# Patient Record
Sex: Female | Born: 1980 | Race: White | Hispanic: No | Smoking: Never smoker
Health system: Southern US, Community
[De-identification: ages and names within clinical notes are randomized; demographics above are authoritative.]

## PROBLEM LIST (undated history)

## (undated) DIAGNOSIS — Z87898 Personal history of other specified conditions: Secondary | ICD-10-CM

## (undated) DIAGNOSIS — O24419 Gestational diabetes mellitus in pregnancy, unspecified control: Secondary | ICD-10-CM

## (undated) DIAGNOSIS — N760 Acute vaginitis: Principal | ICD-10-CM

## (undated) DIAGNOSIS — R87619 Unspecified abnormal cytological findings in specimens from cervix uteri: Secondary | ICD-10-CM

## (undated) DIAGNOSIS — Z3490 Encounter for supervision of normal pregnancy, unspecified, unspecified trimester: Principal | ICD-10-CM

## (undated) DIAGNOSIS — R87629 Unspecified abnormal cytological findings in specimens from vagina: Secondary | ICD-10-CM

## (undated) DIAGNOSIS — B009 Herpesviral infection, unspecified: Secondary | ICD-10-CM

## (undated) DIAGNOSIS — IMO0002 Reserved for concepts with insufficient information to code with codable children: Secondary | ICD-10-CM

## (undated) DIAGNOSIS — B9689 Other specified bacterial agents as the cause of diseases classified elsewhere: Principal | ICD-10-CM

## (undated) DIAGNOSIS — R569 Unspecified convulsions: Secondary | ICD-10-CM

## (undated) DIAGNOSIS — E119 Type 2 diabetes mellitus without complications: Secondary | ICD-10-CM

## (undated) HISTORY — DX: Personal history of other specified conditions: Z87.898

## (undated) HISTORY — DX: Other specified bacterial agents as the cause of diseases classified elsewhere: B96.89

## (undated) HISTORY — DX: Unspecified abnormal cytological findings in specimens from cervix uteri: R87.619

## (undated) HISTORY — DX: Type 2 diabetes mellitus without complications: E11.9

## (undated) HISTORY — DX: Gestational diabetes mellitus in pregnancy, unspecified control: O24.419

## (undated) HISTORY — DX: Herpesviral infection, unspecified: B00.9

## (undated) HISTORY — DX: Encounter for supervision of normal pregnancy, unspecified, unspecified trimester: Z34.90

## (undated) HISTORY — DX: Reserved for concepts with insufficient information to code with codable children: IMO0002

## (undated) HISTORY — PX: NO PAST SURGERIES: SHX2092

## (undated) HISTORY — DX: Acute vaginitis: N76.0

## (undated) HISTORY — DX: Unspecified abnormal cytological findings in specimens from vagina: R87.629

---

## 2000-11-29 ENCOUNTER — Ambulatory Visit (HOSPITAL_COMMUNITY): Admission: RE | Admit: 2000-11-29 | Discharge: 2000-11-29 | Payer: Self-pay | Admitting: *Deleted

## 2000-11-29 ENCOUNTER — Encounter: Payer: Self-pay | Admitting: *Deleted

## 2000-12-18 ENCOUNTER — Inpatient Hospital Stay (HOSPITAL_COMMUNITY): Admission: EM | Admit: 2000-12-18 | Discharge: 2000-12-20 | Payer: Self-pay | Admitting: Emergency Medicine

## 2001-03-28 ENCOUNTER — Inpatient Hospital Stay (HOSPITAL_COMMUNITY): Admission: AD | Admit: 2001-03-28 | Discharge: 2001-03-30 | Payer: Self-pay | Admitting: *Deleted

## 2001-09-15 ENCOUNTER — Emergency Department (HOSPITAL_COMMUNITY): Admission: EM | Admit: 2001-09-15 | Discharge: 2001-09-15 | Payer: Self-pay | Admitting: Internal Medicine

## 2001-10-28 ENCOUNTER — Emergency Department (HOSPITAL_COMMUNITY): Admission: EM | Admit: 2001-10-28 | Discharge: 2001-10-28 | Payer: Self-pay | Admitting: Emergency Medicine

## 2001-12-25 ENCOUNTER — Encounter: Payer: Self-pay | Admitting: *Deleted

## 2001-12-25 ENCOUNTER — Ambulatory Visit (HOSPITAL_COMMUNITY): Admission: RE | Admit: 2001-12-25 | Discharge: 2001-12-25 | Payer: Self-pay | Admitting: *Deleted

## 2002-01-24 ENCOUNTER — Emergency Department (HOSPITAL_COMMUNITY): Admission: EM | Admit: 2002-01-24 | Discharge: 2002-01-24 | Payer: Self-pay | Admitting: Emergency Medicine

## 2002-05-11 ENCOUNTER — Inpatient Hospital Stay (HOSPITAL_COMMUNITY): Admission: RE | Admit: 2002-05-11 | Discharge: 2002-05-13 | Payer: Self-pay | Admitting: *Deleted

## 2002-08-15 ENCOUNTER — Other Ambulatory Visit: Admission: RE | Admit: 2002-08-15 | Discharge: 2002-08-15 | Payer: Self-pay | Admitting: *Deleted

## 2002-09-06 ENCOUNTER — Emergency Department (HOSPITAL_COMMUNITY): Admission: EM | Admit: 2002-09-06 | Discharge: 2002-09-06 | Payer: Self-pay | Admitting: *Deleted

## 2002-09-06 ENCOUNTER — Encounter: Payer: Self-pay | Admitting: *Deleted

## 2003-08-10 ENCOUNTER — Emergency Department (HOSPITAL_COMMUNITY): Admission: EM | Admit: 2003-08-10 | Discharge: 2003-08-10 | Payer: Self-pay | Admitting: Emergency Medicine

## 2004-06-02 ENCOUNTER — Emergency Department (HOSPITAL_COMMUNITY): Admission: EM | Admit: 2004-06-02 | Discharge: 2004-06-02 | Payer: Self-pay | Admitting: Emergency Medicine

## 2004-08-07 ENCOUNTER — Emergency Department (HOSPITAL_COMMUNITY): Admission: EM | Admit: 2004-08-07 | Discharge: 2004-08-07 | Payer: Self-pay | Admitting: Emergency Medicine

## 2006-09-06 ENCOUNTER — Emergency Department (HOSPITAL_COMMUNITY): Admission: EM | Admit: 2006-09-06 | Discharge: 2006-09-06 | Payer: Self-pay | Admitting: Emergency Medicine

## 2006-10-12 ENCOUNTER — Emergency Department (HOSPITAL_COMMUNITY): Admission: EM | Admit: 2006-10-12 | Discharge: 2006-10-12 | Payer: Self-pay | Admitting: Emergency Medicine

## 2006-10-19 ENCOUNTER — Emergency Department (HOSPITAL_COMMUNITY): Admission: EM | Admit: 2006-10-19 | Discharge: 2006-10-19 | Payer: Self-pay | Admitting: *Deleted

## 2007-03-18 ENCOUNTER — Inpatient Hospital Stay (HOSPITAL_COMMUNITY): Admission: AD | Admit: 2007-03-18 | Discharge: 2007-03-18 | Payer: Self-pay | Admitting: Family Medicine

## 2007-05-04 ENCOUNTER — Emergency Department (HOSPITAL_COMMUNITY): Admission: EM | Admit: 2007-05-04 | Discharge: 2007-05-04 | Payer: Self-pay | Admitting: Emergency Medicine

## 2007-08-11 ENCOUNTER — Inpatient Hospital Stay (HOSPITAL_COMMUNITY): Admission: AD | Admit: 2007-08-11 | Discharge: 2007-08-11 | Payer: Self-pay | Admitting: Obstetrics & Gynecology

## 2007-08-20 ENCOUNTER — Inpatient Hospital Stay (HOSPITAL_COMMUNITY): Admission: AD | Admit: 2007-08-20 | Discharge: 2007-08-22 | Payer: Self-pay | Admitting: Obstetrics and Gynecology

## 2007-08-20 ENCOUNTER — Ambulatory Visit: Payer: Self-pay | Admitting: Obstetrics and Gynecology

## 2007-08-28 ENCOUNTER — Encounter: Payer: Self-pay | Admitting: Emergency Medicine

## 2007-08-28 ENCOUNTER — Inpatient Hospital Stay (HOSPITAL_COMMUNITY): Admission: EM | Admit: 2007-08-28 | Discharge: 2007-08-31 | Payer: Self-pay | Admitting: Emergency Medicine

## 2007-08-28 ENCOUNTER — Ambulatory Visit: Payer: Self-pay | Admitting: Emergency Medicine

## 2007-09-05 ENCOUNTER — Ambulatory Visit (HOSPITAL_COMMUNITY): Admission: RE | Admit: 2007-09-05 | Discharge: 2007-09-05 | Payer: Self-pay | Admitting: Obstetrics & Gynecology

## 2007-09-17 ENCOUNTER — Emergency Department (HOSPITAL_COMMUNITY): Admission: EM | Admit: 2007-09-17 | Discharge: 2007-09-17 | Payer: Self-pay | Admitting: Emergency Medicine

## 2008-12-12 ENCOUNTER — Emergency Department (HOSPITAL_COMMUNITY): Admission: EM | Admit: 2008-12-12 | Discharge: 2008-12-12 | Payer: Self-pay | Admitting: Emergency Medicine

## 2009-01-28 ENCOUNTER — Emergency Department (HOSPITAL_COMMUNITY): Admission: EM | Admit: 2009-01-28 | Discharge: 2009-01-28 | Payer: Self-pay | Admitting: Emergency Medicine

## 2009-05-07 ENCOUNTER — Other Ambulatory Visit: Admission: RE | Admit: 2009-05-07 | Discharge: 2009-05-07 | Payer: Self-pay | Admitting: Obstetrics and Gynecology

## 2009-09-24 ENCOUNTER — Encounter: Admission: RE | Admit: 2009-09-24 | Discharge: 2009-09-24 | Payer: Self-pay | Admitting: Obstetrics & Gynecology

## 2009-11-16 ENCOUNTER — Inpatient Hospital Stay (HOSPITAL_COMMUNITY): Admission: AD | Admit: 2009-11-16 | Discharge: 2009-11-16 | Payer: Self-pay | Admitting: Obstetrics and Gynecology

## 2009-11-23 ENCOUNTER — Inpatient Hospital Stay (HOSPITAL_COMMUNITY): Admission: AD | Admit: 2009-11-23 | Discharge: 2009-11-25 | Payer: Self-pay | Admitting: Obstetrics & Gynecology

## 2009-11-23 ENCOUNTER — Ambulatory Visit: Payer: Self-pay | Admitting: Family Medicine

## 2009-11-23 DIAGNOSIS — O24419 Gestational diabetes mellitus in pregnancy, unspecified control: Secondary | ICD-10-CM

## 2010-06-28 ENCOUNTER — Encounter: Payer: Self-pay | Admitting: Neurology

## 2010-08-23 LAB — RPR: RPR Ser Ql: NONREACTIVE

## 2010-08-23 LAB — CBC
MCHC: 34.5 g/dL (ref 30.0–36.0)
RDW: 13.5 % (ref 11.5–15.5)

## 2010-08-23 LAB — GLUCOSE, CAPILLARY: Glucose-Capillary: 104 mg/dL — ABNORMAL HIGH (ref 70–99)

## 2010-08-23 LAB — GLUCOSE, RANDOM: Glucose, Bld: 101 mg/dL — ABNORMAL HIGH (ref 70–99)

## 2010-09-12 LAB — URINALYSIS, ROUTINE W REFLEX MICROSCOPIC
Bilirubin Urine: NEGATIVE
Glucose, UA: NEGATIVE mg/dL
Ketones, ur: NEGATIVE mg/dL
Specific Gravity, Urine: <1.005 — ABNORMAL LOW (ref 1.005–1.030)
pH: 6 (ref 5.0–8.0)

## 2010-09-12 LAB — URINE MICROSCOPIC-ADD ON

## 2010-09-13 LAB — URINE CULTURE

## 2010-09-13 LAB — URINALYSIS, ROUTINE W REFLEX MICROSCOPIC
Nitrite: NEGATIVE
Protein, ur: NEGATIVE mg/dL
Urobilinogen, UA: 0.2 mg/dL (ref 0.0–1.0)

## 2010-09-13 LAB — URINE MICROSCOPIC-ADD ON

## 2010-10-20 NOTE — Discharge Summary (Signed)
NAMEJADIA, [name]                ACCOUNT NO.:  [id number]   MEDICAL RECORD NO.:  [id number]          PATIENT TYPE:  INP   LOCATION:  3008                         FACILITY:  [facility]   PHYSICIAN:  Pramod P. Pearlean Brownie, MD    DATE OF BIRTH:  [birth year]   DATE OF ADMISSION:  08/28/2007  DATE OF DISCHARGE:  08/31/2007                               DISCHARGE SUMMARY   DIAGNOSES AT THE TIME OF DISCHARGE:  1. 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  2. Hypokalemia resolved.   MEDICINES AT THE TIME OF DISCHARGE:  Dilantin 300 mg nightly.   STUDIES PERFORMED:  1. CT of the brain on admission showed subarachnoid blood, primarily      around the right frontal lobe.  Suspect subacute infarct in the      left cerebellar hemisphere.  2. Chest x-ray shows endotracheal tube and NG tube placement.  3. MRI of the brain shows no infarct, abnormal brain edema within the      left cerebellum and affecting the gyri bilaterally from both      occipital regions to both frontal regions.  Pattern suggestive of      posterior reversible encephalopathy syndrome.  This can be seen      with eclampsia.  Subarachnoid hemorrhage within the fossa  of right      frontal region.  No intraparenchymal bleeding is seen.  4. MRI of the head is normal in both large and medium-sized vessels.  5. Chest x-ray shows no acute cardiopulmonary process post extubation.      CT of the brain at 48 hours post hospitalization shows decreasing      conspicuity of right frontal subarachnoid blood.  Bilateral      occipital and posterior parietal white matter hypoattenuation most      compatible with posterior reversible encephalopathy syndrome.   LABORATORY STUDIES:  Hemoglobin 9.8, hematocrit 28.8, white blood cell  11.4, and platelets 117.  Chemistry with BUN 3, creatinine 0.61,  otherwise normal.  Liver function tests normal.  Albumin 2.3.  Triglycerides 180.  Calcium 7.8 with magnesium of 4.3 on drip and  phosphorous of 3.5.  Urinalysis negative.  Dilantin level at 10.5.   HISTORY OF PRESENT ILLNESS:  [name] is a 30 year old right-  handed Caucasian female who is 8 days postpartum with an uncomplicated  vaginal delivery.  She related the 72-hour history of headache worsening  with blurring of vision.  No nausea or vomiting.  CT at [facility] showed a small amount of frontal subarachnoid blood with  hyperdensity in the left cerebellum.  She was transferred to [facility]  and had a seizure in the emergency room.  Her blood pressure was in the  150s/100s.  Dr. Leotis Pain Duru from [facility] was consulted who  recommended magnesium sulfate.  After that, the patient was intubated  and was place on Diprivan drip.  She was admitted to the Neuro ICU for  further evaluation.   HOSPITAL COURSE:  The patient was  placed on nicardipine drip for blood  pressure control.  She was rapidly extubated without difficulty on her  second hospital day.  It felt like her headache, seizures, and blurred  vision were all postpartum related PRES syndrome.  She did maintain a  magnesium drip initially and then it was weaned.  Her blood pressure  came down to 110s/70s.  She was placed on Dilantin for seizure  prophylaxis.  PT, OT and speech therapy evaluated and she has no need in  hospital and is safe to return home.  She was placed on Dilantin 300 mg  p.o. nightly for seizures at home and needs followup in 2 months to  follow up EEG and MRI at that time.  She has been advised not breast-  feed secondary to Dilantin.   CONDITION ON DISCHARGE:  The patient alert and oriented x3.  No focal  deficits.  She has had no seizures.  Her heart rate is regular.  Breath  sounds are clear.   DISCHARGE PLAN:  1. Discharge home with family.  2. Dilantin for seizure.  3. Followup OB/GYN in 1 month.  4. Follow up with Dr. Delia Heady in 2 months.  She would need an      outpatient EEG and MRI at that time.  5. The patient has been advised to not breast-feed.      Annie Main, N.P.    ______________________________  Sunny Schlein. Pearlean Brownie, MD    SB/MEDQ  D:  08/31/2007  T:  09/01/2007  Job:  [id number]   cc:   Tilda Burrow, M.D.

## 2010-10-20 NOTE — H&P (Signed)
NAMEEZRA, MARQUESS NO.:  [id number]   MEDICAL RECORD NO.:  [id number]          PATIENT TYPE:  INP   LOCATION:  1824                         FACILITY:  [facility]   PHYSICIAN:  Payton Doughty, M.D.      DATE OF BIRTH:  [birth year]   DATE OF ADMISSION:  08/28/2007  DATE OF DISCHARGE:                              HISTORY & PHYSICAL   ADMITTING NOTE:  I was called about this is a  30 year old, right  handed, white female, 8 days postpartum, uncomplicated delivery.  She  relates a 72-hour history of headache worsening with blurring of vision  today.  No nausea and vomiting.  CT at [facility] showed small  amount of frontal subarachnoid blood with hyper-density in the left  cerebellum.  She was transported down here and had a seizure in the  emergency room.   MEDICAL HISTORY:  Negative.  There is no hypertension.   SURGICAL HISTORY:  She has had 5 children.   SOCIAL HISTORY:  No tobacco.  No ethanol.   REVIEW OF SYSTEMS:  Remarkable for headache, blurred vision.   FAMILY HISTORY:  Negative.   ALLERGIES:  None.   MEDICATIONS:  None.   PHYSICAL EXAMINATION:  VITAL SIGNS:  Blood pressure was in the  150s/100s, now it is down into the 130s/70s after some labetalol.  GENERAL:  Prior to her seizure, she was awake and alert.  HEENT:  Normal limits.  _______________ poorly seen.  NECK:  Without deformity.  CHEST:  Clear.  CARDIAC:  Regular rate and rhythm without a murmur.  ABDOMEN:  Soft.  EXTREMITIES:  Without clubbing or cyanosis.  NEUROLOGIC:  Once again, prior to her seizure she was awake, alert, and  oriented x3 with good recall.  Her cranial nerves are intact.  Motor  exam is 5/5 with no drift.  Sensation is intact.  Reflexes are 1  throughout.  Toes are downgoing.   CT was noted as above.   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   PLAN:  Blood  pressure control and seizure control with mag sulfate.  I  did discuss this case with Dr. Johnella Moloney from the [facility] who  recommended mag sulfate 6 gram load at 2 G/hr for 24 hours.  After the  patient had her 2nd seizure, I asked that she be intubated.  We will  place her on a Diprivan drip as well and take her for MRA/MRV to rule  out sagittal sinus thrombosis.           ______________________________  Payton Doughty, M.D.     MWR/MEDQ  D:  08/28/2007  T:  08/28/2007  Job:  [id number]

## 2010-10-20 NOTE — H&P (Signed)
[name], [name]                ACCOUNT NO.:  [id number]   MEDICAL RECORD NO.:  [id number]          PATIENT TYPE:  INP   LOCATION:  3103                         FACILITY:  [facility]   PHYSICIAN:  Pramod P. Pearlean Brownie, MD    DATE OF BIRTH:  December 26, 1980   DATE OF ADMISSION:  08/28/2007  DATE OF DISCHARGE:                              HISTORY & PHYSICAL   REASON FOR REFERRAL:  Seizures and headache.   HISTORY OF PRESENT ILLNESS:  [name] is a 30 year old Caucasian lady  who is eight days postpartum who came into the [facility] Emergency Room  today complaining of three days of headache as well as blurred vision  today. She was transferred to [facility] Emergency Room for further  evaluation where a CT scan of the head showed small left frontal and  falx subarachnoid hemorrhage as well as a low density in the left  cerebellum. The patient subsequently had a witnessed generalized  tonoclonic seizure in the emergency room. She was given 2 mg of Ativan  and then had a second witnessed seizure following which she was  intubated and placed on magnesium drip and is currently being on  propofol drip which is about to be started. The patient had blood  pressure documented by EMS as 156/97 and one occasion 171/106 and  168/106. She apparently has no history of hypertension. She was given  one dose of labetalol 20 mg IV once here in the emergency room. At  present, the blood pressure is 110/80. She is also receiving Dilaudid,  Zofran as well as 100 mg of fentanyl prior to the intubation. Prior to  the seizures and sedation when evaluated by the ER nurse here she had a  nonfocal exam and complained only of headache and some blurred vision.  The patient is unavailable to provide any history which is obtained from  her father and mother who are here at the bedside. They state apparently  she has no known prior history of seizure, migraine headaches, or any  known neurologic problems. She recently had her  fifth child eight days  ago. The delivery was uncomplicated. She was doing fine at home and  started complaining of headache only three days ago.   PAST MEDICAL HISTORY:  Unremarkable. No history of hypertension,  seizures, or stroke.   SOCIAL HISTORY:  The patient has five children. She does not smoke or  drink. She lives at home with her husband.   FAMILY HISTORY:  Noncontributory.   REVIEW OF SYSTEMS:  Headache, blurred vision, and now seizures. Recent  delivery.   MEDICATION ALLERGIES:  None known.   HOME MEDICATIONS:  None.   PHYSICAL EXAMINATION:  GENERAL:  Young Caucasian girl who is intubated  and sedated. She is afebrile at present with heart rate of 110 per  minute, regular sinus rhythm.  Blood pressure 110/64. Respiratory rate  is 16 per minute.  Distal pulses are well felt.  HEAD:  Nontraumatic.  NECK:  Supple. There is no bruit.  ENT:  Unremarkable.  CARDIAC:  Regular heart sounds.  LUNGS:  Clear to  auscultation.  ABDOMEN:  Soft, nontender.  NEUROLOGIC:  The patient is sedated. She is unresponsive. She does not  open eyes or follow commands. She moves upper extremities minimally to  sternal rub. Pupils are 3 mm and reactive.  Doll's eye movements are  brisk. Corneal reflexes are brisk bilaterally. She grimaces to pain  bilaterally. Tone is decreased in all four extremities.  Reflexes are  brisk.  Plantars are downgoing. She withdraws both lower extremities and  hands minimally to pain.   LABORATORY DATA:  Noncontrast CAT scan of the head done today reveals  small area of subarachnoid blood in the left frontal region as well as  the anterior interfalx region. Low density of indeterminate age is noted  in the left cerebellar white matter as well.   IMPRESSION:  A 30 year old lady, eight days postpartum, with three days  of headache, blurred vision, and now generalized seizures. Differential  diagnosis includes eclampsia versus venous sinus thrombosis or  posterior  reversible encephalopathy syndrome.   PLAN:  The patient is critically ill and at significant risk for  neurologically worsening seizures and increase in hemorrhage. She will  be admitted to the intensive care unit. Aggressive treatment of blood  pressure with IV Cardene drip to keep the systolic blood pressure 120-  161. Aggressive treatment of seizures initially with magnesium sulfate.  If alternative etiology for seizures is found other than eclampsia, may  switch to Dilantin. Keep the patient sedated with Propofol. Perform an  emergent MRI and MRV of the brain and MRA. I spent an hour of neuro  critical care time at the patient's bedside as well as discussing her  care with Dr. Channing Mutters, the ER physician as well as the patient's parents and  answered questions.           ______________________________  Sunny Schlein. Pearlean Brownie, MD     PPS/MEDQ  D:  08/28/2007  T:  08/28/2007  Job:  [id number]

## 2010-10-20 NOTE — Consult Note (Signed)
NAMECRISTIANNA, [name]                ACCOUNT NO.:  [id number]   MEDICAL RECORD NO.:  [id number]          PATIENT TYPE:  INP   LOCATION:  1824                         FACILITY:  [facility]   PHYSICIAN:  Norton Blizzard, MD    DATE OF BIRTH:  23-Nov-1980   DATE OF CONSULTATION:  DATE OF DISCHARGE:                                 CONSULTATION   The patient is a 30 year old G6, P5-0-1-5 status post spontaneous  vaginal delivery on August 20, 2007, who is now readmitted post partum  day #8 with elevated blood pressures, subarachnoid bleed, and seizures.  Of note, the patient had an uncomplicated prenatal course. There were no  signs or symptoms of chronic hypertension, pregnancy-induced  hypertension, or pre-eclampsia. After her delivery on March 15th, the  patient did not have any elevated blood pressures or other signs of pre-  eclampsia. Her maximum blood pressure was 118/75. She was discharged to  home on post-partum day #2 in stable condition. The patient presented to  [facility] ER earlier today with headache, and the headache was noted to  be unresponsive to medications. She underwent a CT scan which showed  subarachnoid blood primarily around the right frontal lobe, and concern  for a subacute infarct involving the left cerebral hemisphere. The  patient was then urgently sent to [facility] for further management.  Upon arrival at [facility] ED, the patient was noted to have a seizure  in the setting of blood pressure in the 170's/110. Given that she is  recently post partum, the major differential diagnosis was that of  eclamptic seizure. I was called for recommendations for the management  of eclamptic seizures. As of the time of this dictation, the patient is  being managed by the neurosurgery team and Dr. Trey Sailors at [facility].   After consulting with the maternal fetal medicine specialist, Dr. Toma Copier, the following are the recommendations for the management of this  patient:  1. For an eclamptic seizure, the usual management is magnesium      sulfate. We recommend a 6-gram load intravenously followed by 2      grams per hour for the next 24 hours. The patient's magnesium level      should be kept between 4.8 to 8.4 mg/dl for therapeutic effect. Her      urine output should be closely monitored to make sure that she is      making adequate urine, and the patient's labs should also be      checked for other signs of end-organ  damage that could be present      which could involve her kidneys, liver, and also her hematologic      state in the form of low platelets or hemolysis.  2. Given that the patient was noted to have a subarachnoid bleed and      possible CVA, the etiology of her hypertension and seizures may not      be eclampsia related. It is unlikely that this is the case, but      given that there  is a small chance of this possibility, we      recommend a neurologic consult as the patient might need to be on      another antiepileptic medication even after the 24 hours of      magnesium administration or concurrently.  3. The patient is recently post partum and will be expected to have      bleeding or lochia that is expected for a few weeks after a vaginal      delivery. If there are any other questions about her post-partum      course and what to expect from an obstetric standpoint, please do      not hesitate to call us at [facility] and you can ask for the      attending on call.   We will follow along with you and help in any capacity that we can, and  please do not hesitate to call us for any questions.      Norton Blizzard, MD  Electronically Signed     UAD/MEDQ  D:  08/28/2007  T:  08/28/2007  Job:  [id number]

## 2010-10-20 NOTE — Consult Note (Signed)
NAMEJARAE, [name]                ACCOUNT NO.:  [id number]   MEDICAL RECORD NO.:  [id number]          PATIENT TYPE:  INP   LOCATION:  1824                         FACILITY:  [facility]   PHYSICIAN:  Norton Blizzard, MD    DATE OF BIRTH:  [birth year]   DATE OF CONSULTATION:  08/28/2007  DATE OF DISCHARGE:                                 CONSULTATION   HISTORY OF PRESENT ILLNESS:  The patient is a 30 year old G6, P5-0-1-5,  status post spontaneous vaginal delivery on August 18, 2007, who is re-  admitted today at [facility] for evaluation and management of  subarachnoid bleed. Of note, the patient had an uncomplicated vaginal  delivery on August 20, 2007 and was discharged to home in stable  condition 2 days later. During her postpartum stay and also during her  antepartal, she did not have any signs or symptoms of pregnancy-induced  hypertension or preeclampsia. Her highest blood pressure after delivery  was 118/75. The patient apparently reported to [facility]  emergency room today with a severe headache that was unresponsive to  medications and imaging showed subarachnoid bleed, primarily around the  right frontal lobe with a concern for possible subacute infarct  involving the left cerebellar hemisphere   DICTATION ENDS HERE.      Norton Blizzard, MD     UAD/MEDQ  D:  08/28/2007  T:  08/28/2007  Job:  [id number]

## 2010-10-23 NOTE — Discharge Summary (Signed)
[facility]  Patient:    [name], [name] Visit Number: [id number] MRN: [id number]          Service Type: OBS Location: 4A A418 01 Attending Physician:  Jeri Cos. Dictated by:   Langley Gauss, M.D. Admit Date:  03/28/2001 Discharge Date: 03/30/2001   CC:         Vivia Ewing, D.O.   Discharge Summary  DISCHARGE DIAGNOSIS:  38 plus week intrauterine pregnancy for induction of labor secondary to psychosocial factors.  PROCEDURE PERFORMED:  Spontaneous assisted vaginal delivery 8 pound 2 ounce female infant delivered over an intact perineum.  Pediatrician on call.  PERTINENT LABORATORY DATA:  Include admission hemoglobin and hematocrit 10.8/30.5 with a white count of 11.8.  Postpartum day #1 10.1/28.5.  DISCHARGE MEDICATIONS: 1. P.o. Motrin for pain relief. 2. 150 mg of IM Depo-Provera at time of discharge.  NOTE:  She would eventually like to have an IUD placed.  DISPOSITION:  She will follow up in the office in 5 weeks time at which time if still interested we can order an IUD for placement 1 week later.  HOSPITAL COURSE:  See previous dictations.  Patient delivered in the very late p.m. of March 28, 2001.  Postpartum did very well.  Bottle fed the infant. She did have one episode of heavy vaginal bleeding with bright red blood however, no passage of clots.  PHYSICAL EXAMINATION:  Examination at time of discharge the uterus is noted to be 16 week size, firm with no excessive bleeding.  Thus patient discharged home at the time.  Patient utilizing pediatrician on call, Dr. Vivia Ewing. Dictated by:   Langley Gauss, M.D. Attending Physician:  Jeri Cos. DD:  03/30/01 TD:  03/31/01 Job: [id number]

## 2010-10-23 NOTE — Op Note (Signed)
[name], [name]                          ACCOUNT NO.:  [id number]   MEDICAL RECORD NO.:  [id number]                   PATIENT TYPE:  INP   LOCATION:  A417                                 FACILITY:  [facility]   PHYSICIAN:  Langley Gauss, M.D.                DATE OF BIRTH:  05-12-1981   DATE OF PROCEDURE:  05/11/2002  DATE OF DISCHARGE:  05/13/2002                                 OPERATIVE REPORT   DELIVERY NOTE:   DIAGNOSES:  1. A 38-week intrauterine pregnancy, presenting in early labor.  2. Dysfunctional labor requiring Pitocin augmentation secondary to     inadequate frequency and intensity of uterine contractions.  3. Iron-deficiency anemia.   PROCEDURES:  1. Placement of continuous lumbar epidural anesthesia.  2. Spontaneous assisted vaginal delivery of a 7 pound 15 ounce female infant     delivered over an intact perform, delivery performed by Langley Gauss,     M.D.   ESTIMATED BLOOD LOSS:  Less than 500 cc.   COMPLICATIONS:  None.   SPECIMENS:  Arterial cord gas and cord blood to pathology.  The placenta was  examined and noted to be apparently intact with a three-vessel umbilical  cord.   SUMMARY:  The patient presented early a.m., 05/11/02, in early stages of  labor with irregular uterine contractions.  Amniotomy was performed with the  findings of clear amniotic fluid.  The patient did have a dysfunctional  labor pattern requiring Pitocin augmentation with onset of active uterine  contractions.  The patient initially requested only IV pain medication.  She  did receive two doses of IV Nubain; however, shortly thereafter she began  having stronger contractions and noted to have inadequate labor and  analgesia and thus requested an epidural, which was placed without  difficulty.  This functioned very well throughout the labor course with  excellent labor analgesia.  The patient was noted to have some pelvic  pressure, at which time she was examined and noted to be  completely dilated.  The patient at that point in time was placed in the dorsal lithotomy  position, prepped and draped in the usual sterile manner.  The Foley  catheter was removed.  The patient pushed well during the short second stage  of labor with descent of the infant's vertex to the perineal floor and noted  to be in an LOA position.  The infant then delivered over an intact perineum  without any lacerations occurring.  A nuchal cord x1 without compression was  reduced at time of delivery.  Gentle downward traction resulted in delivery  of the anterior shoulder on the pubic symphysis without difficulty.  The  umbilical cord was then milked toward the infant and the cord wsa doubly  clamped and cut, and the infant wsa placed on the maternal abdomen for  immediate bonding purposes.  The arterial cord gas and cord blood are then  obtained.  Gentle traction on the umbilical cord results in separation,  which upon examination appears to be an intact three-vessel placenta and  attached cord.  Examination of the genital tract reveals no  lacerations.  The perineum is intact.  Excellent uterine tone is achieved  following deliver.  The patient is thus returned to the left lateral  decubitus position, at which time the epidural catheter is removed with the  blue tip noted to be intact.  Both mother and infant are doing well  following delivery.                                               Langley Gauss, M.D.    DC/MEDQ  D:  05/14/2002  T:  05/14/2002  Job:  [id number]

## 2010-10-23 NOTE — Op Note (Signed)
   [name], [name]                          ACCOUNT NO.:  [id number]   MEDICAL RECORD NO.:  [id number]                   PATIENT TYPE:  INP   LOCATION:  A417                                 FACILITY:  [facility]   PHYSICIAN:  Langley Gauss, M.D.                DATE OF BIRTH:  10/16/1980   DATE OF PROCEDURE:  05/11/2002  DATE OF DISCHARGE:  05/13/2002                                 OPERATIVE REPORT   PROCEDURE PERFORMED:  Placement of continuous lumbar epidural anesthesia at  the L3-L4 interspace.   OPERATOR:  Langley Gauss, M.D.   COMPLICATIONS:  None.   DESCRIPTION OF PROCEDURE:  Appropriate informed consent was obtained.  The  patient had had two epidurals placed with previous pregnancies.  Continuous  electronic fetal monitoring was performed.  The patient was placed in the  fetal position at which time bony landmarks were identified.  The L3-L4  interspace was selected.  The patient's back was sterilely prepped and  draped utilizing the epidural kit.  5 cc of 1% lidocaine plain was injected  at the midline of the L3-L4 interspace to raise a small skin wheal.  The 17  gauge Tuohy Schliff needle was then utilized with loss of resistance and  _________ to identify and enter into epidural space on the first attempt  without difficulty.  Initial test dose of 5 cc of 1.5% lidocaine plus  epinephrine were injected through the epidural needle.  No signs of CSF or  intravascular injection obtained.  Thus the epidural catheter was inserted  to a depth of 5 cm.  The epidural needle was removed.  Aspiration test was  negative.  A second test dose of 2 cc of 1.5% lidocaine plus epinephrine was  injected through the epidural catheter.  Again, no signs of CSF,  intravascular injection obtained.  The patient is having tingling in the  feet consistent with the appropriate setting of the epidural block, thus she  is connected to the anesthesia pump containing the standard mixture.  She  will  be treated with a bolus of 10 cc followed by a continuous infusion at a  rate of 12 cc per hour.  The patient is returned to the left lateral supine  position at which time she does have evidence of a bilateral block with good  labral analgesia in a continued reassuring fetal heart rate.                                               Langley Gauss, M.D.    DC/MEDQ  D:  05/14/2002  T:  05/14/2002  Job:  [id number]

## 2010-10-23 NOTE — Discharge Summary (Signed)
   [name], [name]                          ACCOUNT NO.:  [id number]   MEDICAL RECORD NO.:  [id number]                   PATIENT TYPE:  INP   LOCATION:  A417                                 FACILITY:  [facility]   PHYSICIAN:  Langley Gauss, M.D.                DATE OF BIRTH:  [birth year]   DATE OF ADMISSION:  05/11/2002  DATE OF DISCHARGE:  05/13/2002                                 DISCHARGE SUMMARY   IMPRESSION:  1. Spontaneous assisted vaginal delivery of 7 pound 15 ounce female infant.  2. Continuous lumbar epidural analgesia.  3. Infant circumcision.   DISPOSITION:  The patient is to follow-up in the office in four weeks time.  She does desire permanent sterilization at that time. She has been supplied  and did sign the Medicaid paper regarding permanent sterilization and the 30  day waiting period on May 13, 2002. The patient is bottle feeding at the  time of discharge utilizing [employer]   DISCHARGE MEDICATIONS:  Lortab 10/500 as needed for chronic back pain. Now  with exacerbation following delivery.   LABORATORY DATA:  RPR is nonreactive. O+ blood type. Hemoglobin and  hematocrit 11.3 and 33.3 with white count of 11.2. On postpartum day one,  hemoglobin and hematocrit was 9.9 and 29.2 with a white count of 14.4.   HOSPITAL COURSE:  The patient labored and delivered on May 11, 2002.  Postpartum, she did very well. She bonded well with the infant. Had no  difficulty with the bottle feeding. Ambulated without difficulty. Had normal  lochia. No excessive vaginal bleeding. Was thus, discharged to home on  May 13, 2002. She was given a copy of standard discharge instructions.  Infant circumcision performed on the day of discharge.                                                Langley Gauss, M.D.    DC/MEDQ  D:  05/14/2002  T:  05/14/2002  Job:  [id number]

## 2010-10-23 NOTE — H&P (Addendum)
NAME:  [name], [name]                          ACCOUNT NO.:  [id number]   MEDICAL RECORD NO.:  [id number]                   PATIENT TYPE:  INP   LOCATION:  A417                                 FACILITY:  [facility]   PHYSICIAN:  Langley Gauss, M.D.                DATE OF BIRTH:  07/07/1980   DATE OF ADMISSION:  05/11/2002  DATE OF DISCHARGE:  05/13/2002                                HISTORY & PHYSICAL   HISTORY OF PRESENT ILLNESS:  This is a 30 year old gravida 4 para 3 who  presented to [facility] in the early stages of labor on May 11, 2002.  She was observed throughout the evening and had noted to have  increased frequently and intensity of uterine contractions.  The patient's  prenatal course has been complicated only by chronic back pain which is  exacerbated by the pregnancy.   OBSTETRICAL HISTORY:  Pertinent for three prior vaginal deliveries all  without complications.  She did have epidurals with two of these; with the  third she received IV narcotics only.   ALLERGIES:  No known drug allergies.   CURRENT MEDICATIONS:  1. Prenatal vitamins.  2. Lortab 10/500 on a p.r.n. basis.   PHYSICAL EXAMINATION:  VITAL SIGNS:  Height 4 feet 11 inches, weight 95,  blood pressure 122/85, pulse 84, respiratory rate 20.  HEENT:  Negative, no adenopathy.  NECK:  Supple.  Thyroid is nonpalpable.  LUNGS:  Clear.  CARDIOVASCULAR:  Regular rate and rhythm.  ABDOMEN:  Soft and nontender.  No surgical scars are identified.  Vertex  presentation by Leopold's maneuvers.  Fundal height of 36 cm.  EXTREMITIES:  Normal.  PELVIC:  Normal external genitalia.  No lesions or ulcerations identified,  no vaginal bleeding, no leakage of fluid.  Cervix 3 cm dilated, 70% effaced,  -1 station, very posterior.  MONITOR:  External fetal monitor reveals reassuring fetal heart rate with  irregular uterine contractions every three to eight minutes.   ASSESSMENT:  Gravida 4 para 3 presents in  early labor at [redacted] weeks gestation.  Will proceed with amniotomy.  Thereafter, augment or induce with Pitocin as  clinically indicated.                                                                                                             Langley Gauss, M.D.   DC/MEDQ  D:  05/14/2002  T:  05/14/2002  Job:  [id number]

## 2010-10-23 NOTE — Op Note (Signed)
[facility]  Patient:    [name], [name] Visit Number: [id number] MRN: [id number]          Service Type: OBS Location: 4A A418 01 Attending Physician:  Jeri Cos. Dictated by:   Langley Gauss, M.D. Proc. Date: 03/28/01 Admit Date:  03/28/2001 Discharge Date: 03/30/2001                             Operative Report  DIAGNOSIS:  A 38 plus week intrauterine pregnancy for induction of labor.  PROCEDURES:  Spontaneous assisted vaginal delivery 8 pound 2 ounce female infant delivered over intact perineum.  Delivery performed by Dr. Roylene Reason. Lisette Grinder. Estimated blood loss less than 500 cc.  COMPLICATIONS:  None.  SPECIMENS:  Arterial cord gas and cord blood to pathology for study only. Placenta is examined and is noted to apparently intact with the 3 vessel umbilical cord.  SUMMARY:  This is a 30 year old gravida 3, para 2, at [redacted] weeks gestation, who was admitted for induction of labor secondary to psychosocial factors.  The patients initial examination revealed very favorable cervix, 4 cm dilated. The patient is having irregular uterine contractions upon admission with a reassuring fetal heart rate.  Amniotomy is performed with findings of clear amniotic fluid.  Fetal scalp electrode is placed.  The patient continued to have a reassuring fetal heart rate throughout the course of labor.  The patient did require Pitocin augmentation secondary to dysfunctional labor pattern with inadequate frequency and intensity of uterine contractions.  With the onset of active labor the patient progressed normally along the labor curve.  She had epidurals with the 2 previous deliveries and had stated an interest in epidural during this labor, however, when I presented to place it it was not placed.  The patient thereafter progressed to complete dilatation, placed in the dorsolithotomy position.  The patient had ______ pushing efforts during the second stage of  labor, however, through excellent coaching efforts by the nursing staff, specifically, Anselmo Pickler Bricketts, the patient pushed well, descent to the vertex to the perineal floor, delivery then occurred in a direct OA position over an intact perineum while the nares were bulb suctioned of clear amniotic fluid.  Continued expulsive efforts resulted in spontaneous rotation to a left anterior shoulder position.  Gentle downward traction combined with expulsive efforts resulted in this delivery under the pubis symphysis without difficulty.  Remainder of the infant also delivered without difficulty.  The umbilical cord is then milked towards the infant, cord is doubly clamped and cut.  Infant is handed to the nursing staff for immediate assessment.  There is noted to be a spontaneous and vigorous breathing cry.  Arterial cord gas, and cord blood, are then obtained from the umbilical cord.  Gentle traction on the umbilical cord resulted in separation which upon examination appears to be intact 3 vessel placenta.  The patient had immediate onset of excellent uterine tone.  Examination of the genital tract reveals no lacerations, and specifically the perineum is noted to be intact.  Mother is taken out of dorsolithotomy position, allowed to bond with the infant.  She does plan on bottle feeding. Dictated by:   Langley Gauss, M.D. Attending Physician:  Jeri Cos. DD:  03/30/01 TD:  03/31/01 Job: [id number]

## 2011-03-01 LAB — BASIC METABOLIC PANEL
CO2: 21
CO2: 24
CO2: 24
Calcium: 5.8 — CL
Calcium: 7.3 — ABNORMAL LOW
Calcium: 7.8 — ABNORMAL LOW
Creatinine, Ser: 0.59
Creatinine, Ser: 0.61
Creatinine, Ser: 0.67
GFR calc Af Amer: >60
GFR calc non Af Amer: >60
GFR calc non Af Amer: >60
Glucose, Bld: 101 — ABNORMAL HIGH
Glucose, Bld: 195 — ABNORMAL HIGH
Sodium: 137
Sodium: 142

## 2011-03-01 LAB — BLOOD GAS, ARTERIAL
Acid-Base Excess: 0
Acid-Base Excess: 0.3
Acid-base deficit: 0.4
Bicarbonate: 22.8
Delivery systems: POSITIVE
Drawn by: 24549
FIO2: 0.3
FIO2: 0.3
FIO2: 0.6
MECHVT: 500
O2 Saturation: 100
O2 Saturation: 99.2
O2 Saturation: 99.7
RATE: 12
TCO2: 23.7
pCO2 arterial: 28.2 — ABNORMAL LOW
pCO2 arterial: 31.4 — ABNORMAL LOW
pO2, Arterial: 144 — ABNORMAL HIGH
pO2, Arterial: 179 — ABNORMAL HIGH

## 2011-03-01 LAB — URINALYSIS, ROUTINE W REFLEX MICROSCOPIC
Bilirubin Urine: NEGATIVE
Glucose, UA: NEGATIVE
Hgb urine dipstick: NEGATIVE
Hgb urine dipstick: NEGATIVE
Protein, ur: NEGATIVE
Specific Gravity, Urine: 1.017
Urobilinogen, UA: 0.2

## 2011-03-01 LAB — TRIGLYCERIDES: Triglycerides: 180 — ABNORMAL HIGH

## 2011-03-01 LAB — CBC
HCT: 26.4 — ABNORMAL LOW
Hemoglobin: 11.6 — ABNORMAL LOW
MCHC: 33.9
MCHC: 34.7
MCV: 82.5
Platelets: 288
RBC: 3.21 — ABNORMAL LOW
RBC: 4.06
RDW: 12.8
WBC: 12.1 — ABNORMAL HIGH
WBC: 14.4 — ABNORMAL HIGH

## 2011-03-01 LAB — COMPREHENSIVE METABOLIC PANEL
Albumin: 2.3 — ABNORMAL LOW
BUN: <1 — ABNORMAL LOW
Calcium: 5.7 — CL
Creatinine, Ser: 0.67
Glucose, Bld: 112 — ABNORMAL HIGH
Potassium: 3.2 — ABNORMAL LOW
Total Protein: 5.7 — ABNORMAL LOW

## 2011-03-01 LAB — HEPATIC FUNCTION PANEL
Albumin: 2.5 — ABNORMAL LOW
Total Bilirubin: 0.4
Total Protein: 5.5 — ABNORMAL LOW

## 2011-03-01 LAB — RPR: RPR Ser Ql: NONREACTIVE

## 2011-03-01 LAB — PHENYTOIN LEVEL, TOTAL: Phenytoin Lvl: 10.5

## 2011-03-01 LAB — PHOSPHORUS
Phosphorus: 3.1
Phosphorus: 3.5

## 2011-03-01 LAB — CALCIUM, IONIZED: Calcium, Ion: 1.25

## 2011-03-01 LAB — MAGNESIUM
Magnesium: 4.3 — ABNORMAL HIGH
Magnesium: 5.8 — ABNORMAL HIGH

## 2011-03-01 LAB — URINE MICROSCOPIC-ADD ON

## 2011-03-16 LAB — DIFFERENTIAL
Eosinophils Relative: 1
Lymphocytes Relative: 9 — ABNORMAL LOW
Lymphs Abs: 1.3
Monocytes Absolute: 0.7
Monocytes Relative: 5

## 2011-03-16 LAB — CBC
MCHC: 34.4
MCV: 83.4
Platelets: 351
WBC: 13.8 — ABNORMAL HIGH

## 2011-03-16 LAB — COMPREHENSIVE METABOLIC PANEL
AST: 17
Albumin: 2.8 — ABNORMAL LOW
Calcium: 8.7
Creatinine, Ser: 0.45
GFR calc Af Amer: >60
Sodium: 139
Total Protein: 6.4

## 2011-03-16 LAB — URINALYSIS, ROUTINE W REFLEX MICROSCOPIC
Hgb urine dipstick: NEGATIVE
Nitrite: NEGATIVE
Specific Gravity, Urine: 1.01
Urobilinogen, UA: 1
pH: 7.5

## 2011-03-16 LAB — LIPASE, BLOOD: Lipase: 16

## 2011-03-16 LAB — URINE MICROSCOPIC-ADD ON

## 2011-03-18 LAB — URINALYSIS, ROUTINE W REFLEX MICROSCOPIC
Bilirubin Urine: NEGATIVE
Glucose, UA: NEGATIVE
Hgb urine dipstick: NEGATIVE
Nitrite: NEGATIVE
Specific Gravity, Urine: 1.01
pH: 7

## 2011-03-18 LAB — URINE CULTURE

## 2011-03-18 LAB — URINE MICROSCOPIC-ADD ON

## 2011-03-18 LAB — WET PREP, GENITAL: Trich, Wet Prep: NONE SEEN

## 2011-10-19 ENCOUNTER — Other Ambulatory Visit (HOSPITAL_COMMUNITY)
Admission: RE | Admit: 2011-10-19 | Discharge: 2011-10-19 | Disposition: A | Discharge status: Home / Self Care | Payer: Medicaid Other | Source: Ambulatory Visit | Attending: Obstetrics and Gynecology | Admitting: Obstetrics and Gynecology

## 2011-10-19 DIAGNOSIS — Z01419 Encounter for gynecological examination (general) (routine) without abnormal findings: Secondary | ICD-10-CM | POA: Insufficient documentation

## 2011-10-19 DIAGNOSIS — Z1159 Encounter for screening for other viral diseases: Secondary | ICD-10-CM | POA: Insufficient documentation

## 2012-08-28 ENCOUNTER — Encounter: Payer: Self-pay | Admitting: Adult Health

## 2012-08-28 ENCOUNTER — Ambulatory Visit (INDEPENDENT_AMBULATORY_CARE_PROVIDER_SITE_OTHER): Payer: Medicaid Other | Admitting: Adult Health

## 2012-08-28 VITALS — BP 120/76 | Ht 59.0 in | Wt 121.0 lb

## 2012-08-28 DIAGNOSIS — B379 Candidiasis, unspecified: Secondary | ICD-10-CM

## 2012-08-28 DIAGNOSIS — N760 Acute vaginitis: Secondary | ICD-10-CM

## 2012-08-28 DIAGNOSIS — Z309 Encounter for contraceptive management, unspecified: Secondary | ICD-10-CM

## 2012-08-28 DIAGNOSIS — B9689 Other specified bacterial agents as the cause of diseases classified elsewhere: Secondary | ICD-10-CM

## 2012-08-28 DIAGNOSIS — B373 Candidiasis of vulva and vagina: Secondary | ICD-10-CM

## 2012-08-28 HISTORY — DX: Other specified bacterial agents as the cause of diseases classified elsewhere: B96.89

## 2012-08-28 LAB — POCT URINALYSIS DIPSTICK
Blood, UA: 1
Glucose, UA: NEGATIVE
Ketones, UA: NEGATIVE

## 2012-08-28 LAB — POCT WET PREP (WET MOUNT)

## 2012-08-28 MED ORDER — METRONIDAZOLE 500 MG PO TABS: 500.0000 mg | ORAL_TABLET | Freq: Two times a day (BID) | ORAL | Status: DC

## 2012-08-28 MED ORDER — NORGESTIMATE-ETH ESTRADIOL 0.25-35 MG-MCG PO TABS: 1.0000 | ORAL_TABLET | Freq: Every day | ORAL | Status: DC

## 2012-08-28 NOTE — Patient Instructions (Addendum)
Take flagyl as directed, no alcohol, start birth control with next menses, use condoms, follow up in 3 months for physical, do my chart

## 2012-08-28 NOTE — Progress Notes (Signed)
Subjective:     Patient ID: [name], female   DOB: 16-Oct-1980, 32 y.o.   MRN: [id number]  HPI [name] is a 32 year old white female in today with vaginal itching for 2 days, she has not tried over-the-counter medications. She has stopped her Depo-Provera injections and is requesting birth control. She said she had sex about 2 weeks ago.  Review of Systems Patient denies any headaches, blurred vision, shortness of breath, chest pain, abdominal pain, problems with bowel movements, urination, or intercourse. She does have vaginal itching x 2days, and need for birth control, requesting pills, reviewed past medical surgical social and family history. Reviewed medication & allergies. Denies the need for STD testing.     Objective:   Physical ExamVital signs: Blood pressure 120/76, weight 121 lbs., height 4 foot 11 inches. Urine 1+ blood, trace WBC. Wet prep was performed and was positive for clue cells. Skin warm and dry. Pelvic exam: External genitalia normal in appearance, the vagina is normal in appearance except for slight white discharge, the cervix is bulbous with -CMT. The uterus is felt to be within normal size shape and contour, no adnexal masses or tenderness noted.     Assessment:     Bacterial vaginosis Contraceptive management    Plan:      Take Flagyl as prescribed, no alcohol , use condoms, started birth control by mouth with next period, return to clinic in 3 months for Physical exam, prn problems.If no period in 1 month, schedule pregnancy test.

## 2012-08-31 ENCOUNTER — Telehealth: Payer: Self-pay | Admitting: *Deleted

## 2012-08-31 NOTE — Telephone Encounter (Signed)
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 Pt aware Rx called into pharmacy.

## 2012-09-11 ENCOUNTER — Telehealth: Payer: Self-pay | Admitting: Obstetrics and Gynecology

## 2012-09-11 NOTE — Telephone Encounter (Signed)
Per Dr. Erma Pinto, Pt to start BCP 12 weeks from last depo shot received if not had a period before that time. Pt informed could take up to 12 weeks to 6 months to have period since pt was on depo. Pt verbalized understanding.

## 2012-11-20 ENCOUNTER — Other Ambulatory Visit: Payer: Self-pay | Admitting: Obstetrics & Gynecology

## 2012-11-20 DIAGNOSIS — O3680X0 Pregnancy with inconclusive fetal viability, not applicable or unspecified: Secondary | ICD-10-CM

## 2012-11-21 ENCOUNTER — Ambulatory Visit (INDEPENDENT_AMBULATORY_CARE_PROVIDER_SITE_OTHER): Payer: Medicaid Other

## 2012-11-21 ENCOUNTER — Other Ambulatory Visit: Payer: Self-pay | Admitting: Obstetrics & Gynecology

## 2012-11-21 ENCOUNTER — Encounter: Payer: Self-pay | Admitting: Adult Health

## 2012-11-21 ENCOUNTER — Ambulatory Visit (INDEPENDENT_AMBULATORY_CARE_PROVIDER_SITE_OTHER): Payer: Medicaid Other | Admitting: Adult Health

## 2012-11-21 VITALS — BP 102/70 | Wt 119.0 lb

## 2012-11-21 DIAGNOSIS — O09299 Supervision of pregnancy with other poor reproductive or obstetric history, unspecified trimester: Secondary | ICD-10-CM

## 2012-11-21 DIAGNOSIS — O3680X0 Pregnancy with inconclusive fetal viability, not applicable or unspecified: Secondary | ICD-10-CM

## 2012-11-21 DIAGNOSIS — Z1389 Encounter for screening for other disorder: Secondary | ICD-10-CM

## 2012-11-21 DIAGNOSIS — Z331 Pregnant state, incidental: Secondary | ICD-10-CM

## 2012-11-21 DIAGNOSIS — Z3480 Encounter for supervision of other normal pregnancy, unspecified trimester: Secondary | ICD-10-CM

## 2012-11-21 DIAGNOSIS — Z3490 Encounter for supervision of normal pregnancy, unspecified, unspecified trimester: Secondary | ICD-10-CM

## 2012-11-21 HISTORY — DX: Encounter for supervision of normal pregnancy, unspecified, unspecified trimester: Z34.90

## 2012-11-21 LAB — CBC
HCT: 39.7 % (ref 36.0–46.0)
Hemoglobin: 13.5 g/dL (ref 12.0–15.0)
MCHC: 34 g/dL (ref 30.0–36.0)
MCV: 81.7 fL (ref 78.0–100.0)
RDW: 12.8 % (ref 11.5–15.5)

## 2012-11-21 NOTE — Progress Notes (Signed)
No complaints at this time.

## 2012-11-21 NOTE — Progress Notes (Signed)
U/S:  Single 5 wk size gestational sac centrally located in uterus, +yolk sac, too early for FHT or CRL, [facility] Mid February by sac size only Cervix closed, adnexa WNL

## 2012-11-21 NOTE — Patient Instructions (Addendum)

## 2012-11-21 NOTE — Progress Notes (Signed)
[name] is a single 32 year old white female with unplanned pregnancy was undesided at first but now wants to continue pregnancy. No bleeding or discharge or cramping.US showed early IUP with +yolk sac.Skin warm and dry. Neck: mid line trachea, normal thyroid. Lungs: clear to ausculation bilaterally. Cardiovascular: regular rate and rhythm.Breast exam deferred had negative HPV on pap in 2013.Will get prenatal labs today and return in 2 weeks for [place].

## 2012-11-22 LAB — DRUG SCREEN, URINE, NO CONFIRMATION
Barbiturate Quant, Ur: NEGATIVE
Creatinine,U: 32.7 mg/dL
Opiate Screen, Urine: NEGATIVE
Phencyclidine (PCP): NEGATIVE
Propoxyphene: NEGATIVE

## 2012-11-22 LAB — URINALYSIS, ROUTINE W REFLEX MICROSCOPIC
Ketones, ur: NEGATIVE mg/dL
Nitrite: NEGATIVE
Specific Gravity, Urine: 1.011 (ref 1.005–1.030)
Urobilinogen, UA: 0.2 mg/dL (ref 0.0–1.0)

## 2012-11-22 LAB — GC/CHLAMYDIA PROBE AMP
CT Probe RNA: NEGATIVE
GC Probe RNA: NEGATIVE

## 2012-11-22 LAB — ABO AND RH: Rh Type: POSITIVE

## 2012-11-22 LAB — ANTIBODY SCREEN: Antibody Screen: NEGATIVE

## 2012-11-22 LAB — SICKLE CELL SCREEN: Sickle Cell Screen: NEGATIVE

## 2012-11-23 ENCOUNTER — Other Ambulatory Visit: Payer: Self-pay | Admitting: Obstetrics & Gynecology

## 2012-11-23 DIAGNOSIS — O3680X0 Pregnancy with inconclusive fetal viability, not applicable or unspecified: Secondary | ICD-10-CM

## 2012-11-28 ENCOUNTER — Other Ambulatory Visit: Payer: Medicaid Other | Admitting: Adult Health

## 2012-12-04 ENCOUNTER — Encounter: Payer: Self-pay | Admitting: Women's Health

## 2012-12-04 ENCOUNTER — Ambulatory Visit (INDEPENDENT_AMBULATORY_CARE_PROVIDER_SITE_OTHER): Payer: Medicaid Other | Admitting: Women's Health

## 2012-12-04 ENCOUNTER — Ambulatory Visit (INDEPENDENT_AMBULATORY_CARE_PROVIDER_SITE_OTHER): Payer: Medicaid Other

## 2012-12-04 VITALS — BP 100/60 | Wt 117.0 lb

## 2012-12-04 DIAGNOSIS — Z3481 Encounter for supervision of other normal pregnancy, first trimester: Secondary | ICD-10-CM

## 2012-12-04 DIAGNOSIS — O09299 Supervision of pregnancy with other poor reproductive or obstetric history, unspecified trimester: Secondary | ICD-10-CM

## 2012-12-04 DIAGNOSIS — O09291 Supervision of pregnancy with other poor reproductive or obstetric history, first trimester: Secondary | ICD-10-CM

## 2012-12-04 DIAGNOSIS — Z1389 Encounter for screening for other disorder: Secondary | ICD-10-CM

## 2012-12-04 DIAGNOSIS — O219 Vomiting of pregnancy, unspecified: Secondary | ICD-10-CM

## 2012-12-04 DIAGNOSIS — Z3490 Encounter for supervision of normal pregnancy, unspecified, unspecified trimester: Secondary | ICD-10-CM | POA: Insufficient documentation

## 2012-12-04 DIAGNOSIS — Z331 Pregnant state, incidental: Secondary | ICD-10-CM

## 2012-12-04 DIAGNOSIS — O3680X0 Pregnancy with inconclusive fetal viability, not applicable or unspecified: Secondary | ICD-10-CM

## 2012-12-04 LAB — POCT URINALYSIS DIPSTICK
Glucose, UA: NEGATIVE
Leukocytes, UA: NEGATIVE
Nitrite, UA: NEGATIVE

## 2012-12-04 MED ORDER — DOXYLAMINE-PYRIDOXINE 10-10 MG PO TBEC: 10.0000 mg | DELAYED_RELEASE_TABLET | ORAL | Status: DC

## 2012-12-04 NOTE — Progress Notes (Signed)
U/S-single IUP with +FCA noted, CRL c/w 6+6wks, EDD 07/24/2013, cx long and closed, bilateral adnexa wnl

## 2012-12-04 NOTE — Progress Notes (Signed)
HAD U/S TODAY.

## 2012-12-04 NOTE — Patient Instructions (Addendum)
You will have your sugar test next visit.  Please do not eat or drink anything after midnight the night before you come, not even water.  You will be here for at least two hours.     Nausea & Vomiting  Have saltine crackers or pretzels by your bed and eat a few bites before you raise your head out of bed in the morning  Eat small frequent meals throughout the day instead of large meals  Drink plenty of fluids throughout the day to stay hydrated, just don't drink a lot of fluids with your meals.  This can make your stomach fill up faster making you feel sick  Do not brush your teeth right after you eat  Products with real ginger are good for nausea, like ginger ale and ginger hard candy Make sure it says made with real ginger!  Sucking on sour candy like lemon heads is also good for nausea  If your prenatal vitamins make you nauseated, take them at night so you will sleep through the nausea  If you feel like you need medicine for the nausea & vomiting please let us know  If you are unable to keep any fluids or food down please let us know    Pregnancy - First Trimester During sexual intercourse, millions of sperm go into the vagina. Only 1 sperm will penetrate and fertilize the female egg while it is in the Fallopian tube. One week later, the fertilized egg implants into the wall of the uterus. An embryo begins to develop into a baby. At 6 to 8 weeks, the eyes and face are formed and the heartbeat can be seen on ultrasound. At the end of 12 weeks (first trimester), all the baby's organs are formed. Now that you are pregnant, you will want to do everything you can to have a healthy baby. Two of the most important things are to get good prenatal care and follow your caregiver's instructions. Prenatal care is all the medical care you receive before the baby's birth. It is given to prevent, find, and treat problems during the pregnancy and childbirth. PRENATAL EXAMS  During prenatal visits,  your weight, blood pressure, and urine are checked. This is done to make sure you are healthy and progressing normally during the pregnancy.  A pregnant woman should gain 25 to 35 pounds during the pregnancy. However, if you are overweight or underweight, your caregiver will advise you regarding your weight.  Your caregiver will ask and answer questions for you.  Blood work, cervical cultures, other necessary tests, and a Pap test are done during your prenatal exams. These tests are done to check on your health and the probable health of your baby. Tests are strongly recommended and done for HIV with your permission. This is the virus that causes AIDS. These tests are done because medicines can be given to help prevent your baby from being born with this infection should you have been infected without knowing it. Blood work is also used to find out your blood type, previous infections, and follow your blood levels (hemoglobin).  Low hemoglobin (anemia) is common during pregnancy. Iron and vitamins are given to help prevent this. Later in the pregnancy, blood tests for diabetes will be done along with any other tests if any problems develop.  You may need other tests to make sure you and the baby are doing well. CHANGES DURING THE FIRST TRIMESTER  Your body goes through many changes during pregnancy. They vary from  person to person. Talk to your caregiver about changes you notice and are concerned about. Changes can include:  Your menstrual period stops.  The egg and sperm carry the genes that determine what you look like. Genes from you and your partner are forming a baby. The female genes determine whether the baby is a boy or a girl.  Your body increases in girth and you may feel bloated.  Feeling sick to your stomach (nauseous) and throwing up (vomiting). If the vomiting is uncontrollable, call your caregiver.  Your breasts will begin to enlarge and become tender.  Your nipples may stick out  more and become darker.  The need to urinate more. Painful urination may mean you have a bladder infection.  Tiring easily.  Loss of appetite.  Cravings for certain kinds of food.  At first, you may gain or lose a couple of pounds.  You may have changes in your emotions from day to day (excited to be pregnant or concerned something may go wrong with the pregnancy and baby).  You may have more vivid and strange dreams. HOME CARE INSTRUCTIONS   It is very important to avoid all smoking, alcohol and non-prescribed drugs during your pregnancy. These affect the formation and growth of the baby. Avoid chemicals while pregnant to ensure the delivery of a healthy infant.  Start your prenatal visits by the 12th week of pregnancy. They are usually scheduled monthly at first, then more often in the last 2 months before delivery. Keep your caregiver's appointments. Follow your caregiver's instructions regarding medicine use, blood and lab tests, exercise, and diet.  During pregnancy, you are providing food for you and your baby. Eat regular, well-balanced meals. Choose foods such as meat, fish, milk and other low fat dairy products, vegetables, fruits, and whole-grain breads and cereals. Your caregiver will tell you of the ideal weight gain.  You can help morning sickness by keeping soda crackers at the bedside. Eat a couple before arising in the morning. You may want to use the crackers without salt on them.  Eating 4 to 5 small meals rather than 3 large meals a day also may help the nausea and vomiting.  Drinking liquids between meals instead of during meals also seems to help nausea and vomiting.  A physical sexual relationship may be continued throughout pregnancy if there are no other problems. Problems may be early (premature) leaking of amniotic fluid from the membranes, vaginal bleeding, or belly (abdominal) pain.  Exercise regularly if there are no restrictions. Check with your caregiver  or physical therapist if you are unsure of the safety of some of your exercises. Greater weight gain will occur in the last 2 trimesters of pregnancy. Exercising will help:  Control your weight.  Keep you in shape.  Prepare you for labor and delivery.  Help you lose your pregnancy weight after you deliver your baby.  Wear a good support or jogging bra for breast tenderness during pregnancy. This may help if worn during sleep too.  Ask when prenatal classes are available. Begin classes when they are offered.  Do not use hot tubs, steam rooms, or saunas.  Wear your seat belt when driving. This protects you and your baby if you are in an accident.  Avoid raw meat, uncooked cheese, cat litter boxes, and soil used by cats throughout the pregnancy. These carry germs that can cause birth defects in the baby.  The first trimester is a good time to visit your dentist for your  dental health. Getting your teeth cleaned is okay. Use a softer toothbrush and brush gently during pregnancy.  Ask for help if you have financial, counseling, or nutritional needs during pregnancy. Your caregiver will be able to offer counseling for these needs as well as refer you for other special needs.  Do not take any medicines or herbs unless told by your caregiver.  Inform your caregiver if there is any mental or physical domestic violence.  Make a list of emergency phone numbers of family, friends, hospital, and police and fire departments.  Write down your questions. Take them to your prenatal visit.  Do not douche.  Do not cross your legs.  If you have to stand for long periods of time, rotate you feet or take small steps in a circle.  You may have more vaginal secretions that may require a sanitary pad. Do not use tampons or scented sanitary pads. MEDICINES AND DRUG USE IN PREGNANCY  Take prenatal vitamins as directed. The vitamin should contain 1 milligram of folic acid. Keep all vitamins out of reach  of children. Only a couple vitamins or tablets containing iron may be fatal to a baby or young child when ingested.  Avoid use of all medicines, including herbs, over-the-counter medicines, not prescribed or suggested by your caregiver. Only take over-the-counter or prescription medicines for pain, discomfort, or fever as directed by your caregiver. Do not use aspirin, ibuprofen, or naproxen unless directed by your caregiver.  Let your caregiver also know about herbs you may be using.  Alcohol is related to a number of birth defects. This includes fetal alcohol syndrome. All alcohol, in any form, should be avoided completely. Smoking will cause low birth rate and premature babies.  Street or illegal drugs are very harmful to the baby. They are absolutely forbidden. A baby born to an addicted mother will be addicted at birth. The baby will go through the same withdrawal an adult does.  Let your caregiver know about any medicines that you have to take and for what reason you take them. SEEK MEDICAL CARE IF:  You have any concerns or worries during your pregnancy. It is better to call with your questions if you feel they cannot wait, rather than worry about them. SEEK IMMEDIATE MEDICAL CARE IF:   An unexplained oral temperature above 102 F (38.9 C) develops, or as your caregiver suggests.  You have leaking of fluid from the vagina (birth canal). If leaking membranes are suspected, take your temperature and inform your caregiver of this when you call.  There is vaginal spotting or bleeding. Notify your caregiver of the amount and how many pads are used.  You develop a bad smelling vaginal discharge with a change in the color.  You continue to feel sick to your stomach (nauseated) and have no relief from remedies suggested. You vomit blood or coffee ground-like materials.  You lose more than 2 pounds of weight in 1 week.  You gain more than 2 pounds of weight in 1 week and you notice swelling  of your face, hands, feet, or legs.  You gain 5 pounds or more in 1 week (even if you do not have swelling of your hands, face, legs, or feet).  You get exposed to [place] measles and have never had them.  You are exposed to fifth disease or chickenpox.  You develop belly (abdominal) pain. Round ligament discomfort is a common non-cancerous (benign) cause of abdominal pain in pregnancy. Your caregiver still must evaluate  this.  You develop headache, fever, diarrhea, pain with urination, or shortness of breath.  You fall or are in a car accident or have any kind of trauma.  There is mental or physical violence in your home. Document Released: 05/18/2001 Document Revised: 02/16/2012 Document Reviewed: 11/19/2008 [facility] Patient Information 2014 [employer], [place].

## 2012-12-04 NOTE — Progress Notes (Signed)
Denies cramping/vb, urinary frequency, urgency, hesitancy, or dysuria.  Does have daily nausea- reviewed prevention measures, rx'd diclegis and gave 2 samples.  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 Reviewed warning s/s to report.  All questions answered. F/U this week for early 2hr gtt, then 4wks for visit.

## 2012-12-11 ENCOUNTER — Encounter: Payer: Self-pay | Admitting: Obstetrics and Gynecology

## 2012-12-11 ENCOUNTER — Other Ambulatory Visit: Payer: Medicaid Other

## 2012-12-11 DIAGNOSIS — O09291 Supervision of pregnancy with other poor reproductive or obstetric history, first trimester: Secondary | ICD-10-CM

## 2012-12-12 ENCOUNTER — Telehealth: Payer: Self-pay | Admitting: Obstetrics & Gynecology

## 2012-12-12 LAB — GLUCOSE TOLERANCE, 2 HOURS W/ 1HR: Glucose, 2 hour: 159 mg/dL — ABNORMAL HIGH (ref 70–139)

## 2012-12-12 NOTE — Telephone Encounter (Signed)
Pt aware of abnormal glucose tolerance test per Joellyn Haff, CNM. Pt will be referred to Dietician and they will contact her with an appt. Pt verbalized understanding.

## 2012-12-14 ENCOUNTER — Telehealth: Payer: Self-pay | Admitting: Obstetrics & Gynecology

## 2012-12-14 ENCOUNTER — Encounter: Payer: Self-pay | Admitting: Women's Health

## 2012-12-14 ENCOUNTER — Other Ambulatory Visit: Payer: Self-pay | Admitting: Women's Health

## 2012-12-14 NOTE — Telephone Encounter (Signed)
Pt informed of abnorrnal Glucose tolerance result from 12/11/2012

## 2012-12-18 ENCOUNTER — Telehealth: Payer: Self-pay | Admitting: *Deleted

## 2012-12-18 NOTE — Telephone Encounter (Signed)
Pt c/o constipation. Pt informed to drink 6-8 glasses of water a day, increase foods high fiber, prune juice, prunes. If no improvement pt to call office back. Pt verbalized understanding.

## 2012-12-19 ENCOUNTER — Encounter: Payer: Self-pay | Admitting: Women's Health

## 2012-12-19 ENCOUNTER — Encounter: Payer: Medicaid Other | Admitting: Women's Health

## 2012-12-19 ENCOUNTER — Other Ambulatory Visit: Payer: Medicaid Other

## 2012-12-19 DIAGNOSIS — O24419 Gestational diabetes mellitus in pregnancy, unspecified control: Secondary | ICD-10-CM | POA: Insufficient documentation

## 2012-12-25 ENCOUNTER — Telehealth: Payer: Self-pay | Admitting: Obstetrics & Gynecology

## 2012-12-25 NOTE — Telephone Encounter (Signed)
Pt informed of elevated Glucose tolerance testing. Pt to have A1C at next visit. Pt verbalized understanding. Referral to dietician.

## 2013-01-01 ENCOUNTER — Ambulatory Visit (INDEPENDENT_AMBULATORY_CARE_PROVIDER_SITE_OTHER): Payer: Medicaid Other | Admitting: Women's Health

## 2013-01-01 VITALS — BP 90/56 | Wt 115.0 lb

## 2013-01-01 DIAGNOSIS — O0991 Supervision of high risk pregnancy, unspecified, first trimester: Secondary | ICD-10-CM

## 2013-01-01 DIAGNOSIS — IMO0001 Reserved for inherently not codable concepts without codable children: Secondary | ICD-10-CM

## 2013-01-01 DIAGNOSIS — R768 Other specified abnormal immunological findings in serum: Secondary | ICD-10-CM

## 2013-01-01 DIAGNOSIS — Z1389 Encounter for screening for other disorder: Secondary | ICD-10-CM

## 2013-01-01 DIAGNOSIS — O09299 Supervision of pregnancy with other poor reproductive or obstetric history, unspecified trimester: Secondary | ICD-10-CM

## 2013-01-01 DIAGNOSIS — O24319 Unspecified pre-existing diabetes mellitus in pregnancy, unspecified trimester: Secondary | ICD-10-CM

## 2013-01-01 DIAGNOSIS — O98519 Other viral diseases complicating pregnancy, unspecified trimester: Secondary | ICD-10-CM

## 2013-01-01 DIAGNOSIS — R7689 Other specified abnormal immunological findings in serum: Secondary | ICD-10-CM | POA: Insufficient documentation

## 2013-01-01 DIAGNOSIS — O99810 Abnormal glucose complicating pregnancy: Secondary | ICD-10-CM

## 2013-01-01 DIAGNOSIS — Z331 Pregnant state, incidental: Secondary | ICD-10-CM

## 2013-01-01 LAB — POCT URINALYSIS DIPSTICK
Glucose, UA: NEGATIVE
Ketones, UA: NEGATIVE
Nitrite, UA: NEGATIVE

## 2013-01-01 LAB — HEMOGLOBIN A1C
Hgb A1c MFr Bld: 5.1 % (ref <5.7)
Mean Plasma Glucose: 100 mg/dL (ref <117)

## 2013-01-01 NOTE — Patient Instructions (Signed)
Gestational Diabetes Mellitus Gestational diabetes mellitus, often simply referred to as gestational diabetes, is a type of diabetes that some women develop during pregnancy. In gestational diabetes, the pancreas does not make enough insulin (a hormone), the cells are less responsive to the insulin that is made (insulin resistance), or both.Normally, insulin moves sugars from food into the tissue cells. The tissue cells use the sugars for energy. The lack of insulin or the lack of normal response to insulin causes excess sugars to build up in the blood instead of going into the tissue cells. As a result, high blood sugar (hyperglycemia) develops. The effect of high sugar (glucose) levels can cause many complications.  RISK FACTORS You have an increased chance of developing gestational diabetes if you have a family history of diabetes and also have one or more of the following risk factors:  A body mass index over 30 (obesity).  A previous pregnancy with gestational diabetes.  An older age at the time of pregnancy. If blood glucose levels are kept in the normal range during pregnancy, women can have a healthy pregnancy. If your blood glucose levels are not well controlled, there may be risks to you, your unborn baby (fetus), your labor and delivery, or your newborn baby.  SYMPTOMS  If symptoms are experienced, they are much like symptoms you would normally expect during pregnancy. The symptoms of gestational diabetes include:   Increased thirst (polydipsia).  Increased urination (polyuria).  Increased urination during the night (nocturia).  Weight loss. This weight loss may be rapid.  Frequent, recurring infections.  Tiredness (fatigue).  Weakness.  Vision changes, such as blurred vision.  Fruity smell to your breath.  Abdominal pain. DIAGNOSIS Diabetes is diagnosed when blood glucose levels are increased. Your blood glucose level may be checked by one or more of the following  blood tests:  A fasting blood glucose test. You will not be allowed to eat for at least 8 hours before a blood sample is taken.  A random blood glucose test. Your blood glucose is checked at any time of the day regardless of when you ate.  A hemoglobin A1c blood glucose test. A hemoglobin A1c test provides information about blood glucose control over the previous 3 months.  An oral glucose tolerance test (OGTT). Your blood glucose is measured after you have not eaten (fasted) for 1 3 hours and then after you drink a glucose-containing beverage. Since the hormones that cause insulin resistance are highest at about 24 28 weeks of a pregnancy, an OGTT is usually performed during that time. If you have risk factors for gestational diabetes, your caregiver may test you for gestational diabetes earlier than 24 weeks of pregnancy. TREATMENT   You will need to take diabetes medicine or insulin daily to keep blood glucose levels in the desired range.  You will need to match insulin dosing with exercise and healthy food choices. The treatment goal is to maintain the before meal (preprandial), bedtime, and overnight blood glucose level at 60 99 mg/dL during pregnancy. The treatment goal is to further maintain peak after meal blood sugar (postprandial glucose) level at 100 140 mg/dL.  HOME CARE INSTRUCTIONS   Have your hemoglobin A1c level checked twice a year.  Perform daily blood glucose monitoring as directed by your caregiver. It is common to perform frequent blood glucose monitoring.  Monitor urine ketones when you are ill and as directed by your caregiver.  Take your diabetes medicine and insulin as directed by your caregiver to   maintain your blood glucose level in the desired range.  Never run out of diabetes medicine or insulin. It is needed every day.  Adjust insulin based on your intake of carbohydrates. Carbohydrates can raise blood glucose levels but need to be included in your diet.  Carbohydrates provide vitamins, minerals, and fiber which are an essential part of a healthy diet. Carbohydrates are found in fruits, vegetables, whole grains, dairy products, legumes, and foods containing added sugars.    Eat healthy foods. Alternate 3 meals with 3 snacks.  Maintain a healthy weight gain. The usual total expected weight gain varies according to your prepregnancy body mass index (BMI).  Carry a medical alert card or wear your medical alert jewelry.  Carry a 15 gram carbohydrate snack with you at all times to treat low blood glucose (hypoglycemia). Some examples of 15 gram carbohydrate snacks include:  Glucose tablets, 3 or 4   Glucose gel, 15 gram tube  Raisins, 2 tablespoons (24 g)  Jelly beans, 6  Animal crackers, 8  Fruit juice, regular soda, or low fat milk, 4 ounces (120 mL)  Gummy treats, 9    Recognize hypoglycemia. Hypoglycemia during pregnancy occurs with blood glucose levels of 60 mg/dL and below. The risk for hypoglycemia increases when fasting or skipping meals, during or after intense exercise, and during sleep. Hypoglycemia symptoms can include:  Tremors or shakes.  Decreased ability to concentrate.  Sweating.  Increased heart rate.  Headache.  Dry mouth.  Hunger.  Irritability.  Anxiety.  Restless sleep.  Altered speech or coordination.  Confusion.  Treat hypoglycemia promptly. If you are alert and able to safely swallow, follow the 15:15 rule:  Take 15 20 grams of rapid-acting glucose or carbohydrate. Rapid-acting options include glucose gel, glucose tablets, or 4 ounces (120 mL) of fruit juice, regular soda, or low fat milk.  Check your blood glucose level 15 minutes after taking the glucose.   Take 15 20 grams more of glucose if the repeat blood glucose level is still 70 mg/dL or below.  Eat a meal or snack within 1 hour once blood glucose levels return to normal.  Be alert to polyuria and polydipsia which are early  signs of hyperglycemia. An early awareness of hyperglycemia allows for prompt treatment. Treat hyperglycemia as directed by your caregiver.  Engage in at least 30 minutes of physical activity a day or as directed by your caregiver. Ten minutes of physical activity timed 30 minutes after each meal is encouraged to control postprandial blood glucose levels.  Adjust your insulin dosing and food intake as needed if you start a new exercise or sport.  Follow your sick day plan at any time you are unable to eat or drink as usual.  Avoid tobacco and alcohol use.  Follow up with your caregiver regularly.  Follow the advice of your caregiver regarding your prenatal and post-delivery (postpartum) appointments, meal planning, exercise, medicines, vitamins, blood tests, other medical tests, and physical activities.  Perform daily skin and foot care. Examine your skin and feet daily for cuts, bruises, redness, nail problems, bleeding, blisters, or sores.  Brush your teeth and gums at least twice a day and floss at least once a day. Follow up with your dentist regularly.  Schedule an eye exam during the first trimester of your pregnancy or as directed by your caregiver.  Share your diabetes management plan with your workplace or school.  Stay up-to-date with immunizations.  Learn to manage stress.  Obtain ongoing diabetes education   and support as needed. SEEK MEDICAL CARE IF:   You are unable to eat food or drink fluids for more than 6 hours.  You have nausea and vomiting for more than 6 hours.  You have a blood glucose level of 200 mg/dL and you have ketones in your urine.  There is a change in mental status.  You develop vision problems.  You have a persistent headache.  You have upper abdominal pain or discomfort.  You develop an additional serious illness.  You have diarrhea for more than 6 hours.  You have been sick or have had a fever for a couple of days and are not getting  better. SEEK IMMEDIATE MEDICAL CARE IF:   You have difficulty breathing.  You no longer feel the baby moving.  You are bleeding or have discharge from your vagina.  You start having premature contractions or labor. MAKE SURE YOU:  Understand these instructions.  Will watch your condition.  Will get help right away if you are not doing well or get worse. Document Released: 08/30/2000 Document Revised: 02/16/2012 Document Reviewed: 12/21/2011 [employer] Patient Information 2014 [employer].  

## 2013-01-01 NOTE — Progress Notes (Signed)
Denies uc's, lof, vb, urinary frequency, urgency, hesitancy, or dysuria.  No complaints.  States has never received call from diabetes educator, so hasn't been checking cbg's. Reviewed warning s/s to report.  All questions answered. A1C today, rx for glucometer/strips/lancets w/ instructions on QID testing given and discussed diet.  Diabetes referral placed again. F/U in 2wks for visit, cbg f/u.

## 2013-01-01 NOTE — Progress Notes (Signed)
No complaints at this time.

## 2013-01-02 ENCOUNTER — Encounter: Payer: Self-pay | Admitting: Women's Health

## 2013-01-03 ENCOUNTER — Telehealth: Payer: Self-pay | Admitting: *Deleted

## 2013-01-03 NOTE — Telephone Encounter (Signed)
Pt wants to have IT done, appt scheduled for January 10, 2013 at 11am.

## 2013-01-04 ENCOUNTER — Telehealth: Payer: Self-pay | Admitting: Obstetrics and Gynecology

## 2013-01-04 NOTE — Telephone Encounter (Signed)
Pt informed of A1C of 5.1 from 01/01/13.

## 2013-01-05 ENCOUNTER — Other Ambulatory Visit: Payer: Self-pay | Admitting: Women's Health

## 2013-01-05 DIAGNOSIS — Z36 Encounter for antenatal screening of mother: Secondary | ICD-10-CM

## 2013-01-10 ENCOUNTER — Ambulatory Visit (INDEPENDENT_AMBULATORY_CARE_PROVIDER_SITE_OTHER): Payer: Medicaid Other

## 2013-01-10 ENCOUNTER — Other Ambulatory Visit: Payer: Self-pay | Admitting: Obstetrics & Gynecology

## 2013-01-10 DIAGNOSIS — Z36 Encounter for antenatal screening of mother: Secondary | ICD-10-CM

## 2013-01-10 NOTE — Progress Notes (Signed)
U/S(12+1wks)-single active fetus, CRL c/w dates, cx long and closed(3.4cm), NB present, NT=1.67mm, bilateral adnexa wnl

## 2013-01-15 ENCOUNTER — Encounter: Payer: Self-pay | Admitting: Women's Health

## 2013-01-15 ENCOUNTER — Ambulatory Visit (INDEPENDENT_AMBULATORY_CARE_PROVIDER_SITE_OTHER): Payer: Medicaid Other | Admitting: Women's Health

## 2013-01-15 VITALS — BP 110/60 | Wt 116.0 lb

## 2013-01-15 DIAGNOSIS — Z331 Pregnant state, incidental: Secondary | ICD-10-CM

## 2013-01-15 DIAGNOSIS — O24319 Unspecified pre-existing diabetes mellitus in pregnancy, unspecified trimester: Secondary | ICD-10-CM

## 2013-01-15 DIAGNOSIS — O0991 Supervision of high risk pregnancy, unspecified, first trimester: Secondary | ICD-10-CM

## 2013-01-15 DIAGNOSIS — O98519 Other viral diseases complicating pregnancy, unspecified trimester: Secondary | ICD-10-CM

## 2013-01-15 DIAGNOSIS — O09299 Supervision of pregnancy with other poor reproductive or obstetric history, unspecified trimester: Secondary | ICD-10-CM

## 2013-01-15 DIAGNOSIS — O99810 Abnormal glucose complicating pregnancy: Secondary | ICD-10-CM

## 2013-01-15 DIAGNOSIS — Z1389 Encounter for screening for other disorder: Secondary | ICD-10-CM

## 2013-01-15 LAB — POCT URINALYSIS DIPSTICK
Glucose, UA: NEGATIVE
Nitrite, UA: NEGATIVE

## 2013-01-15 NOTE — Progress Notes (Signed)
Denies cramping, lof, vb, urinary frequency, urgency, hesitancy, or dysuria.  Reports constipation.  Didn't bring log, reports fasting cbg's 90s-100s, has been drinking sodas in middle of night. 2hr pp's mostly <120, a few slightly over. Reviewed appropriate beverage choices, bringing log to every appt, warning s/s to report.  All questions answered. Had 1st IT/NT on 8/6.  F/U in 4wks for 2nd IT and visit.

## 2013-01-15 NOTE — Patient Instructions (Signed)
Constipation  Drink plenty of fluid, preferably water, throughout the day  Eat foods high in fiber such as fruits, vegetables, and grains  Exercise, such as walking, is a good way to keep your bowels regular  Drink warm fluids, especially warm prune juice, or decaf coffee  Eat a 1/2 cup of real oatmeal (not instant), 1/2 cup applesauce, and 1/2-1 cup warm prune juice every day  If needed, you may take Colace (docusate sodium) stool softener once or twice a day to help keep the stool soft. If you are pregnant, wait until you are out of your first trimester (12-14 weeks of pregnancy)  If you still are having problems with constipation, you may take Miralax once daily as needed to help keep your bowels regular.  If you are pregnant, wait until you are out of your first trimester (12-14 weeks of pregnancy)   Please write your blood sugars in a log and bring with you to every visit. Check your sugars 4 times a day: in the morning before you eat or drink anything (<90), then 2 hours after breakfast, lunch, and supper (<120)  Type 1 or Type 2 Diabetes Mellitus During Pregnancy Diabetes mellitus, often simply referred to as diabetes, is a long-term (chronic) disease. Type 1 diabetes occurs when the islet cells in the pancreas that make insulin (a hormone) are destroyed and can no longer make insulin. Type 2 diabetes occurs when the pancreas does not make enough insulin, the cells are less responsive to the insulin that is made (insulin resistance), or both. Insulin is needed to move sugars from food into the tissue cells. The tissue cells use the sugars for energy. The lack of insulin or the lack of normal response to insulin causes excess sugars to build up in the blood instead of going into the tissue cells. As a result, high blood sugar (hyperglycemia) develops.  If blood glucose levels are kept in the normal range both before and during pregnancy, women can have a healthy pregnancy. If your blood  glucose levels are not well controlled, there may be risks to you, your unborn baby (fetus), your labor and delivery, or your newborn baby.  RISK FACTORS  You are predisposed to developing type 1 diabetes if someone in your family has diabetes and you are exposed to certain environmental triggers.  You have an increased chance of developing type 2 diabetes if you have a family history of diabetes and also have one or more of the following risk factors:  Being overweight.  Having an inactive lifestyle.  Having a history of consistently eating high-calorie foods. SYMPTOMS The symptoms of diabetes include:  Increased thirst (polydipsia).  Increased urination (polyuria).  Increased urination during the night (nocturia).  Weight loss. This weight loss may be rapid.  Frequent, recurring infections.  Tiredness (fatigue).  Weakness.  Vision changes, such as blurred vision.  Fruity smell to your breath.  Abdominal pain.  Nausea or vomiting. DIAGNOSIS  Diabetes is diagnosed when blood glucose levels are increased. Your blood glucose level may be checked by one or more of the following blood tests:  A fasting blood glucose test. You will not be allowed to eat for at least 8 hours before a blood sample is taken.  A random blood glucose test. Your blood glucose is checked at any time of the day regardless of when you ate.  A hemoglobin A1c blood glucose test. A hemoglobin A1c test provides information about blood glucose control over the previous 3 months.  An oral glucose tolerance test (OGTT). Your blood glucose is measured after you have not eaten (fasted) for 1 3 hours and then after you drink a glucose-containing beverage. An OGTT is usually performed during weeks 24 28 of your pregnancy. TREATMENT   You will need to take diabetes medicine or insulin daily to keep blood glucose levels in the desired range.  You will need to match insulin dosing with exercise and healthy  food choices. The treatment goal is to maintain the before-meal (preprandial), bedtime, and overnight blood glucose level at 60 99 mg/dL during pregnancy. The treatment goal is to further maintain the peak after-meal blood sugar (postprandial glucose) level at 100 140 mg/dL.  HOME CARE INSTRUCTIONS   Have your hemoglobin A1c level checked twice a year.  Perform daily blood glucose monitoring as directed by your caregiver. It is common to perform frequent blood glucose monitoring.  Monitor urine ketones when you are ill and as directed by your caregiver.  Take your diabetes medicine and insulin as directed by your caregiver to maintain your blood glucose level in the desired range.  Never run out of diabetes medicine or insulin. It is needed every day.  Adjust insulin based on your intake of carbohydrates. Carbohydrates can raise blood glucose levels but need to be included in your diet. Carbohydrates provide vitamins, minerals, and fiber, which are an essential part of a healthy diet. Carbohydrates are found in fruits, vegetables, whole grains, dairy products, legumes, and foods containing added sugars.  Eat healthy foods. Alternate 3 meals with 3 snacks.  Maintain a healthy weight gain. The usual total expected weight gain varies according to your prepregnancy body mass index (BMI).  Carry a medical alert card or wear medical alert jewelry.  Carry a 15 gram carbohydrate snack with you at all times to treat low blood sugar (hypoglycemia). Some examples of 15 gram carbohydrate snacks include:  Glucose tablets, 3 or 4.  Glucose gel, 15 gram tube.  Raisins, 2 tablespoons (24 grams).  Jelly beans, 6.  Animal crackers, 8.  Fruit juice, regular soda, or low fat milk, 4 ounces (120 mL).  Gummy treats, 9.  Recognize hypoglycemia. Hypoglycemia during pregnancy occurs with blood glucose levels of 60 mg/dL and below. The risk for hypoglycemia increases when fasting or skipping meals,  during or after intense exercise, and during sleep. Hypoglycemia symptoms can include:  Tremors or shakes.  Decreased ability to concentrate.  Sweating.  Increased heart rate.  Headache.  Dry mouth.  Hunger.  Irritability.  Anxiety.  Restless sleep.  Altered speech or coordination.  Confusion.  Treat hypoglycemia promptly. If you are alert and able to safely swallow, follow the 15:15 rule:  Take 15 20 grams of rapid-acting glucose or carbohydrate. Rapid-acting options include glucose gel, glucose tablets, or 4 ounces (120 mL) of fruit juice, regular soda, or low-fat milk.  Check your blood glucose level 15 minutes after taking the glucose.  Take 15 20 grams more of glucose if the repeat blood glucose level is still 70 mg/dL or below.  Eat a meal or snack within 1 hour once blood glucose levels return to normal.  Engage in at least 30 minutes of physical activity a day or as directed by your caregiver. Ten minutes of physical activity timed 30 minutes after each meal is encouraged to control postprandial blood glucose levels.   Be alert to polyuria and polydipsia, which are early signs of hyperglycemia. An early awareness of hyperglycemia allows for prompt treatment. Treat hyperglycemia  as directed by your caregiver.  Adjust your insulin dosing and food intake as needed if you start a new exercise or sport.  Follow your sick day plan at any time you are unable to eat or drink as usual.  Avoid tobacco and alcohol use.  Follow up with your caregiver regularly.  Follow the advice of your caregiver regarding your prenatal and post-delivery (postpartum) appointments, meal planning, exercise, medicines, vitamins, blood tests, other medical tests, and physical activities.  Continue daily skin and foot care. Examine your skin and feet daily for cuts, bruises, redness, nail problems, bleeding, blisters, or sores. A foot exam by a caregiver should be done annually.  Brush  your teeth and gums at least twice a day and floss at least once a day. Follow up with your dentist regularly.  Schedule an eye exam during the first trimester of your pregnancy or as directed by your caregiver.  Share your diabetes management plan with your workplace or school.  Stay up-to-date with immunizations.  Learn to manage stress.  Obtain ongoing diabetes education and support as needed. SEEK MEDICAL CARE IF:   You are unable to eat food or drink fluids for more than 6 hours.  You have nausea and vomiting for more than 6 hours.  You have a blood glucose level of 200 mg/dL and you have ketones in your urine.  There is a change in mental status.  You develop vision problems.  You have a persistent headache.  You have upper abdominal pain or discomfort.  You develop an additional serious illness.  You have diarrhea for more than 6 hours.  You have been sick or have had a fever for a couple of days and are not getting better. SEEK IMMEDIATE MEDICAL CARE IF:  You have difficulty breathing.  You no longer feel the baby moving.  You are bleeding or have discharge from your vagina.  You start having premature contractions or labor. MAKE SURE YOU:  Understand these instructions.  Will watch your condition.  Will get help right away if you are not doing well or get worse. Document Released: 02/16/2012 Document Revised: 05/10/2012 Document Reviewed: 02/16/2012 [facility] Patient Information 2014 [place], [place].

## 2013-01-23 ENCOUNTER — Ambulatory Visit: Payer: Medicaid Other | Admitting: *Deleted

## 2013-02-01 ENCOUNTER — Telehealth: Payer: Self-pay | Admitting: *Deleted

## 2013-02-01 NOTE — Telephone Encounter (Signed)
Pt states thinks she has yeast infection, c/o vaginal itching. Can Diflucan be e-scribed?   Pt was offered an appt for tomorrow, pt states has another appt at 11:30.

## 2013-02-02 ENCOUNTER — Encounter: Payer: Medicaid Other | Admitting: Obstetrics and Gynecology

## 2013-02-02 MED ORDER — FLUCONAZOLE 150 MG PO TABS: 150.0000 mg | ORAL_TABLET | Freq: Once | ORAL | Status: DC

## 2013-02-02 NOTE — Telephone Encounter (Signed)
Pt aware diflucan e-scribed.

## 2013-02-12 ENCOUNTER — Encounter: Payer: Medicaid Other | Admitting: Women's Health

## 2013-02-15 ENCOUNTER — Ambulatory Visit (INDEPENDENT_AMBULATORY_CARE_PROVIDER_SITE_OTHER): Payer: Medicaid Other | Admitting: Advanced Practice Midwife

## 2013-02-15 ENCOUNTER — Encounter: Payer: Self-pay | Admitting: Advanced Practice Midwife

## 2013-02-15 ENCOUNTER — Other Ambulatory Visit: Payer: Self-pay | Admitting: Obstetrics & Gynecology

## 2013-02-15 VITALS — BP 108/70 | Wt 119.0 lb

## 2013-02-15 DIAGNOSIS — Z1389 Encounter for screening for other disorder: Secondary | ICD-10-CM

## 2013-02-15 DIAGNOSIS — O99810 Abnormal glucose complicating pregnancy: Secondary | ICD-10-CM

## 2013-02-15 DIAGNOSIS — O98519 Other viral diseases complicating pregnancy, unspecified trimester: Secondary | ICD-10-CM

## 2013-02-15 DIAGNOSIS — O09299 Supervision of pregnancy with other poor reproductive or obstetric history, unspecified trimester: Secondary | ICD-10-CM

## 2013-02-15 DIAGNOSIS — Z331 Pregnant state, incidental: Secondary | ICD-10-CM

## 2013-02-15 LAB — POCT URINALYSIS DIPSTICK
Blood, UA: NEGATIVE
Ketones, UA: NEGATIVE
Protein, UA: NEGATIVE

## 2013-02-15 NOTE — Progress Notes (Signed)
Has not been checking BS in a week or so.  Pt encouraged to check BS and bring log to every visit.    No c/o at this time.  Routine questions about pregnancy answered.  F/U in 2 weeks for anatomy scan.

## 2013-02-15 NOTE — Progress Notes (Signed)
2nd IT today. 

## 2013-02-23 LAB — MATERNAL SCREEN, INTEGRATED #2
AFP MoM: 1.72
AFP, Serum: 89.3 ng/mL
MSS Down Syndrome: <1:5000 {titer}
MSS Trisomy 18 Risk: <1:5000 {titer}
Nuchal Translucency: 1.41 mm
Number of fetuses: 1
PAPP-A MoM: 0.8
PAPP-A: 909 ng/mL

## 2013-03-01 ENCOUNTER — Encounter: Payer: Self-pay | Admitting: Advanced Practice Midwife

## 2013-03-01 ENCOUNTER — Other Ambulatory Visit: Payer: Self-pay | Admitting: Advanced Practice Midwife

## 2013-03-01 ENCOUNTER — Ambulatory Visit (INDEPENDENT_AMBULATORY_CARE_PROVIDER_SITE_OTHER): Payer: Medicaid Other | Admitting: Advanced Practice Midwife

## 2013-03-01 ENCOUNTER — Ambulatory Visit (INDEPENDENT_AMBULATORY_CARE_PROVIDER_SITE_OTHER): Payer: Medicaid Other

## 2013-03-01 VITALS — BP 90/58 | Wt 119.0 lb

## 2013-03-01 DIAGNOSIS — Z331 Pregnant state, incidental: Secondary | ICD-10-CM

## 2013-03-01 DIAGNOSIS — Z1389 Encounter for screening for other disorder: Secondary | ICD-10-CM

## 2013-03-01 DIAGNOSIS — O99810 Abnormal glucose complicating pregnancy: Secondary | ICD-10-CM

## 2013-03-01 DIAGNOSIS — O09299 Supervision of pregnancy with other poor reproductive or obstetric history, unspecified trimester: Secondary | ICD-10-CM

## 2013-03-01 DIAGNOSIS — O98519 Other viral diseases complicating pregnancy, unspecified trimester: Secondary | ICD-10-CM

## 2013-03-01 DIAGNOSIS — O0991 Supervision of high risk pregnancy, unspecified, first trimester: Secondary | ICD-10-CM

## 2013-03-01 LAB — POCT URINALYSIS DIPSTICK
Ketones, UA: NEGATIVE
Protein, UA: NEGATIVE

## 2013-03-01 NOTE — Progress Notes (Signed)
U/S(19+2wks)-active fetus, meas c/w dates, fluid wnl, post gr 0 plac, cx long and closed, bilateral adnexa wnl, no major abnl noted, FHR-159 bpm, female fetus

## 2013-03-01 NOTE — Patient Instructions (Addendum)
1800 Calorie Diet for Diabetes Meal Planning The 1800 calorie diet is designed for eating up to 1800 calories each day. Following this diet and making healthy meal choices can help improve overall health. This diet controls blood sugar (glucose) levels and can also help lower blood pressure and cholesterol. SERVING SIZES Measuring foods and serving sizes helps to make sure you are getting the right amount of food. The list below tells how big or small some common serving sizes are:  1 oz.........4 stacked dice.  3 oz........Marland KitchenDeck of cards.  1 tsp.......Marland KitchenTip of little finger.  1 tbs......Marland KitchenMarland KitchenThumb.  2 tbs.......Marland KitchenGolf ball.   cup......Marland KitchenHalf of a fist.  1 cup.......Marland KitchenA fist. GUIDELINES FOR CHOOSING FOODS The goal of this diet is to eat a variety of foods and limit calories to 1800 each day. This can be done by choosing foods that are low in calories and fat. The diet also suggests eating small amounts of food frequently. Doing this helps control your blood glucose levels so they do not get too high or too low. Each meal or snack may include a protein food source to help you feel more satisfied and to stabilize your blood glucose. Try to eat about the same amount of food around the same time each day. This includes weekend days, travel days, and days off work. Space your meals about 4 to 5 hours apart and add a snack between them if you wish.  For example, a daily food plan could include breakfast, a morning snack, lunch, dinner, and an evening snack. Healthy meals and snacks include whole grains, vegetables, fruits, lean meats, poultry, fish, and dairy products. As you plan your meals, select a variety of foods. Choose from the bread and starch, vegetable, fruit, dairy, and meat/protein groups. Examples of foods from each group and their suggested serving sizes are listed below. Use measuring cups and spoons to become familiar with what a healthy portion looks like. Bread and Starch Each serving  equals 15 grams of carbohydrates.  1 slice bread.   bagel.   cup cold cereal (unsweetened).   cup hot cereal or mashed potatoes.  1 small potato (size of a computer mouse).   cup cooked pasta or rice.   English muffin.  1 cup broth-based soup.  3 cups of popcorn.  4 to 6 whole-wheat crackers.   cup cooked beans, peas, or corn. Vegetable Each serving equals 5 grams of carbohydrates.   cup cooked vegetables.  1 cup raw vegetables.   cup tomato or vegetable juice. Fruit Each serving equals 15 grams of carbohydrates.  1 small apple or orange.  1 cup watermelon or strawberries.   cup applesauce (no sugar added).  2 tbs raisins.   banana.   cup canned fruit, packed in water, its own juice, or sweetened with a sugar substitute.   cup unsweetened fruit juice. Dairy Each serving equals 12 to 15 grams of carbohydrates.  1 cup fat-free milk.  6 oz artificially sweetened yogurt or plain yogurt.  1 cup low-fat buttermilk.  1 cup soy milk.  1 cup almond milk. Meat/Protein  1 large egg.  2 to 3 oz meat, poultry, or fish.   cup low-fat cottage cheese.  1 tbs peanut butter.  1 oz low-fat cheese.   cup tuna in water.   cup tofu. Fat  1 tsp oil.  1 tsp trans-fat-free margarine.  1 tsp butter.  1 tsp mayonnaise.  2 tbs avocado.  1 tbs salad dressing.  1 tbs cream cheese.  2 tbs sour cream. SAMPLE 1800 CALORIE DIET PLAN Breakfast   cup unsweetened cereal (1 carb serving).  1 cup fat-free milk (1 carb serving).  1 slice whole-wheat toast (1 carb serving).   small banana (1 carb serving).  1 scrambled egg.  1 tsp trans-fat-free margarine. Lunch  Tuna sandwich.  2 slices whole-wheat bread (2 carb servings).   cup canned tuna in water, drained.  1 tbs reduced fat mayonnaise.  1 stalk celery, chopped.  2 slices tomato.  1 lettuce leaf.  1 cup carrot sticks.  24 to 30 seedless grapes (2 carb  servings).  6 oz light yogurt (1 carb serving). Afternoon Snack  3 graham cracker squares (1 carb serving).  Fat-free milk, 1 cup (1 carb serving).  1 tbs peanut butter. Dinner  3 oz salmon, broiled with 1 tsp oil.  1 cup mashed potatoes (2 carb servings) with 1 tsp trans-fat-free margarine.  1 cup fresh or frozen green beans.  1 cup steamed asparagus.  1 cup fat-free milk (1 carb serving). Evening Snack  3 cups air-popped popcorn (1 carb serving).  2 tbs parmesan cheese sprinkled on top. MEAL PLAN Use this worksheet to help you make a daily meal plan based on the 1800 calorie diet suggestions. If you are using this plan to help you control your blood glucose, you may interchange carbohydrate-containing foods (dairy, starches, and fruits). Select a variety of fresh foods of varying colors and flavors. The total amount of carbohydrate in your meals or snacks is more important than making sure you include all of the food groups every time you eat. Choose from the following foods to build your day's meals:  8 Starches.  4 Vegetables.  3 Fruits.  2 Dairy.  6 to 7 oz Meat/Protein.  Up to 4 Fats. Your dietician can use this worksheet to help you decide how many servings and which types of foods are right for you. BREAKFAST Food Group and Servings / Food Choice Starch ________________________________________________________ Dairy _________________________________________________________ Fruit _________________________________________________________ Meat/Protein __________________________________________________ Fat ___________________________________________________________ LUNCH Food Group and Servings / Food Choice Starch ________________________________________________________ Meat/Protein __________________________________________________ Vegetable _____________________________________________________ Fruit  _________________________________________________________ Dairy _________________________________________________________ Fat ___________________________________________________________ Aura Fey Food Group and Servings / Food Choice Starch ________________________________________________________ Meat/Protein __________________________________________________ Fruit __________________________________________________________ Dairy _________________________________________________________ Laural Golden Food Group and Servings / Food Choice Starch _________________________________________________________ Meat/Protein ___________________________________________________ Dairy __________________________________________________________ Vegetable ______________________________________________________ Fruit ___________________________________________________________ Fat ____________________________________________________________ Lollie Sails Food Group and Servings / Food Choice Fruit __________________________________________________________ Meat/Protein ___________________________________________________ Dairy __________________________________________________________ Starch _________________________________________________________ DAILY TOTALS Starch ____________________________ Vegetable _________________________ Fruit _____________________________ Dairy _____________________________ Meat/Protein______________________ Fat _______________________________ Document Released: 12/14/2004 Document Revised: 08/16/2011 Document Reviewed: 04/09/2011 [employer] Patient Information 2014 [employer]. Diabetes, Eating Away From Home Sometimes, you might eat in a restaurant or have meals that are prepared by someone else. You can enjoy eating out. However, the portions in restaurants may be much larger than needed. Listed below are some ideas to help you choose foods that will keep your blood glucose  (sugar) in better control.  TIPS FOR EATING OUT  Know your meal plan and how many carbohydrate servings you should have at each meal. You may wish to carry a copy of your meal plan in your purse or wallet. Learn the foods included in each food group.  Make a list of restaurants near you that offer healthy choices. Take a copy of the carry-out menus to see what they offer. Then, you can plan what you will order ahead of time.  Become familiar with serving sizes by practicing them  at home using measuring cups and spoons. Once you learn to recognize portion sizes, you will be able to correctly estimate the amount of total carbohydrate you are allowed to eat at the restaurant. Ask for a takeout box if the portion is more than you should have. When your food comes, leave the amount you should have on the plate, and put the rest in the takeout box before you start eating.  Plan ahead if your mealtime will be different from usual. Check with your caregiver to find out how to time meals and medicine if you are taking insulin.  Avoid high-fat foods, such as fried foods, cream sauces, high-fat salad dressings, or any added butter or margarine.  Do not be afraid to ask questions. Ask your server about the portion size, cooking methods, ingredients and if items can be substituted. Restaurants do not list all available items on the menu. You can ask for your main entree to be prepared using skim milk, oil instead of butter or margarine, and without gravy or sauces. Ask your waiter or waitress to serve salad dressings, gravy, sauces, margarine, and sour cream on the side. You can then add the amount your meal plan suggests.  Add more vegetables whenever possible.  Avoid items that are labeled "jumbo," "giant," "deluxe," or "supersized."  You may want to split an entre with someone and order an extra side salad.  Watch for hidden calories in foods like croutons, bacon, or cheese.  Ask your server to take  away the bread basket or chips from your table.  Order a dinner salad as an appetizer. You can eat most foods served in a restaurant. Some foods are better choices than others. Breads and Starches  Recommended: All kinds of bread (wheat, rye, white, oatmeal, [place], [place], raisin), hard or soft dinner rolls, frankfurter or hamburger buns, small bagels, small corn or whole-wheat flour tortillas.  Avoid: Frosted or glazed breads, butter rolls, egg or cheese breads, croissants, sweet rolls, pastries, coffee cake, glazed or frosted doughnuts, muffins. Crackers  Recommended: Animal crackers, graham, rye, saltine, oyster, and matzoth crackers. Bread sticks, melba toast, rusks, pretzels, popcorn (without fat), zwieback toast.  Avoid: High-fat snack crackers or chips. Buttered popcorn. Cereals  Recommended: Hot and cold cereals. Whole grains such as oatmeal or shredded wheat are good choices.  Avoid: Sugar-coated or granola type cereals. Potatoes/Pasta/Rice/Beans  Recommended: Order baked, boiled, or mashed potatoes, rice or noodles without added fat, whole beans. Order gravies, butter, margarine, or sauces on the side so you can control the amount you add.  Avoid: Hash browns or fried potatoes. Potatoes, pasta, or rice prepared with cream or cheese sauce. Potato or pasta salads prepared with large amounts of dressing. Fried beans or fried rice. Vegetables  Recommended: Order steamed, baked, boiled, or stewed vegetables without sauces or extra fat. Ask that sauce be served on the side. If vegetables are not listed on the menu, ask what is available.  Avoid: Vegetables prepared with cream, butter, or cheese sauce. Fried vegetables. Salad Bars  Recommended: Many of the vegetables at a salad bar are considered "free." Use lemon juice, vinegar, or low-calorie salad dressing (fewer than 20 calories per serving) as "free" dressings for your salad. Look for salad bar ingredients that have no added  fat or sugar such as tomatoes, lettuce, cucumbers, broccoli, carrots, onions, and mushrooms.  Avoid: Prepared salads with large amounts of dressing, such as coleslaw, caesar salad, macaroni salad, bean salad, or carrot salad. Fruit  Recommended:  Eat fresh fruit or fresh fruit salad without added dressing. A salad bar often offers fresh fruit choices, but canned fruit at a restaurant is usually packed in sugar or syrup.  Avoid: Sweetened canned or frozen fruits, plain or sweetened fruit juice. Fruit salads with dressing, sour cream, or sugar added to them. Meat and Meat Substitutes  Recommended: Order broiled, baked, roasted, or grilled meat, poultry, or fish. Trim off all visible fat. Do not eat the skin of poultry. The size stated on the menu is the raw weight. Meat shrinks by  in cooking (for example, 4 oz raw equals 3 oz cooked meat).  Avoid: Deep-fat fried meat, poultry, or fish. Breaded meats. Eggs  Recommended: Order soft, hard-cooked, poached, or scrambled eggs. Omelets may be okay, depending on what ingredients are added. Egg substitutes are also a good choice.  Avoid: Fried eggs, eggs prepared with cream or cheese sauce. Milk  Recommended: Order low-fat or fat-free milk according to your meal plan. Plain, nonfat yogurt or flavored yogurt with no sugar added may be used as a substitute for milk. Soy milk may also be used.  Avoid: Milk shakes or sweetened milk beverages. Soups and Combination Foods  Recommended: Clear broth or consomm are "free" foods and may be used as an appetizer. Broth-based soups with fat removed count as a starch serving and are preferred over cream soups. Soups made with beans or split peas may be eaten but count as a starch.  Avoid: Fatty soups, soup made with cream, cheese soup. Combination foods prepared with excessive amounts of fat or with cream or cheese sauces. Desserts and Sweets  Recommended: Ask for fresh fruit. Sponge or angel food cake  without icing, ice milk, no sugar added ice cream, sherbet, or frozen yogurt may fit into your meal plan occasionally.  Avoid: Pastries, puddings, pies, cakes with icing, custard, gelatin desserts. Fats and Oils  Recommended: Choose healthy fats such as olive oil, canola oil, or tub margarine, reduced fat or fat-free sour cream, cream cheese, avocado, or nuts.  Avoid: Any fats in excess of your allowed portion. Deep-fried foods or any food with a large amount of fat. Note: Ask for all fats to be served on the side, and limit your portion sizes according to your meal plan. Document Released: 05/24/2005 Document Revised: 08/16/2011 Document Reviewed: 12/12/2008 [facility] Patient Information 2014 [place], [place]. Diabetes Meal Planning Guide The diabetes meal planning guide is a tool to help you plan your meals and snacks. It is important for people with diabetes to manage their blood glucose (sugar) levels. Choosing the right foods and the right amounts throughout your day will help control your blood glucose. Eating right can even help you improve your blood pressure and reach or maintain a healthy weight. CARBOHYDRATE COUNTING MADE EASY When you eat carbohydrates, they turn to sugar. This raises your blood glucose level. Counting carbohydrates can help you control this level so you feel better. When you plan your meals by counting carbohydrates, you can have more flexibility in what you eat and balance your medicine with your food intake. Carbohydrate counting simply means adding up the total amount of carbohydrate grams in your meals and snacks. Try to eat about the same amount at each meal. Foods with carbohydrates are listed below. Each portion below is 1 carbohydrate serving or 15 grams of carbohydrates. Ask your dietician how many grams of carbohydrates you should eat at each meal or snack. Grains and Starches  1 slice bread.  English muffin or hotdog/hamburger bun.   cup cold cereal  (unsweetened).   cup cooked pasta or rice.   cup starchy vegetables (corn, potatoes, peas, beans, winter squash).  1 tortilla (6 inches).   bagel.  1 waffle or pancake (size of a CD).   cup cooked cereal.  4 to 6 small crackers. *Whole grain is recommended. Fruit  1 cup fresh unsweetened berries, melon, papaya, pineapple.  1 small fresh fruit.   banana or mango.   cup fruit juice (4 oz unsweetened).   cup canned fruit in natural juice or water.  2 tbs dried fruit.  12 to 15 grapes or cherries. Milk and Yogurt  1 cup fat-free or 1% milk.  1 cup soy milk.  6 oz light yogurt with sugar-free sweetener.  6 oz low-fat soy yogurt.  6 oz plain yogurt. Vegetables  1 cup raw or  cup cooked is counted as 0 carbohydrates or a "free" food.  If you eat 3 or more servings at 1 meal, count them as 1 carbohydrate serving. Other Carbohydrates   oz chips or pretzels.   cup ice cream or frozen yogurt.   cup sherbet or sorbet.  2 inch square cake, no frosting.  1 tbs honey, sugar, jam, jelly, or syrup.  2 small cookies.  3 squares of graham crackers.  3 cups popcorn.  6 crackers.  1 cup broth-based soup.  Count 1 cup casserole or other mixed foods as 2 carbohydrate servings.  Foods with less than 20 calories in a serving may be counted as 0 carbohydrates or a "free" food. You may want to purchase a book or computer software that lists the carbohydrate gram counts of different foods. In addition, the nutrition facts panel on the labels of the foods you eat are a good source of this information. The label will tell you how big the serving size is and the total number of carbohydrate grams you will be eating per serving. Divide this number by 15 to obtain the number of carbohydrate servings in a portion. Remember, 1 carbohydrate serving equals 15 grams of carbohydrate. SERVING SIZES Measuring foods and serving sizes helps you make sure you are getting the  right amount of food. The list below tells how big or small some common serving sizes are.  1 oz.........4 stacked dice.  3 oz........Marland KitchenDeck of cards.  1 tsp.......Marland KitchenTip of little finger.  1 tbs......Marland KitchenMarland KitchenThumb.  2 tbs.......Marland KitchenGolf ball.   cup......Marland KitchenHalf of a fist.  1 cup.......Marland KitchenA fist. SAMPLE DIABETES MEAL PLAN Below is a sample meal plan that includes foods from the grain and starches, dairy, vegetable, fruit, and meat groups. A dietician can individualize a meal plan to fit your calorie needs and tell you the number of servings needed from each food group. However, controlling the total amount of carbohydrates in your meal or snack is more important than making sure you include all of the food groups at every meal. You may interchange carbohydrate containing foods (dairy, starches, and fruits). The meal plan below is an example of a 2000 calorie diet using carbohydrate counting. This meal plan has 17 carbohydrate servings. Breakfast  1 cup oatmeal (2 carb servings).   cup light yogurt (1 carb serving).  1 cup blueberries (1 carb serving).   cup almonds. Snack  1 large apple (2 carb servings).  1 low-fat string cheese stick. Lunch  Chicken breast salad.  1 cup spinach.   cup chopped tomatoes.  2 oz chicken breast, sliced.  2 tbs  low-fat [place] dressing.  12 whole-wheat crackers (2 carb servings).  12 to 15 grapes (1 carb serving).  1 cup low-fat milk (1 carb serving). Snack  1 cup carrots.   cup hummus (1 carb serving). Dinner  3 oz broiled salmon.  1 cup brown rice (3 carb servings). Snack  1  cups steamed broccoli (1 carb serving) drizzled with 1 tsp olive oil and lemon juice.  1 cup light pudding (2 carb servings). DIABETES MEAL PLANNING WORKSHEET Your dietician can use this worksheet to help you decide how many servings of foods and what types of foods are right for you.  BREAKFAST Food Group and Servings / Carb Servings Grain/Starches  __________________________________ Dairy __________________________________________ Vegetable ______________________________________ Fruit ___________________________________________ Meat __________________________________________ Fat ____________________________________________ LUNCH Food Group and Servings / Carb Servings Grain/Starches ___________________________________ Dairy ___________________________________________ Fruit ____________________________________________ Meat ___________________________________________ Fat _____________________________________________ Laural Golden Food Group and Servings / Carb Servings Grain/Starches ___________________________________ Dairy ___________________________________________ Fruit ____________________________________________ Meat ___________________________________________ Fat _____________________________________________ SNACKS Food Group and Servings / Carb Servings Grain/Starches ___________________________________ Dairy ___________________________________________ Vegetable _______________________________________ Fruit ____________________________________________ Meat ___________________________________________ Fat _____________________________________________ DAILY TOTALS Starches _________________________ Vegetable ________________________ Fruit ____________________________ Dairy ____________________________ Meat ____________________________ Fat ______________________________ Document Released: 02/18/2005 Document Revised: 08/16/2011 Document Reviewed: 12/30/2008 [employer] Patient Information 2014 [employer].

## 2013-03-01 NOTE — Progress Notes (Addendum)
Had u/s today.  Normal female.  Pt brought her BS for the past 2 weeks.  Most FBS 95-110, most PP 130-150.  Pt admits to not being compliant with diet.  She wants to try to "be Good" before starting oral meds.  F/U 2 weeks to check BS log

## 2013-03-06 ENCOUNTER — Telehealth: Payer: Self-pay | Admitting: *Deleted

## 2013-03-07 ENCOUNTER — Encounter: Payer: Self-pay | Admitting: *Deleted

## 2013-03-07 ENCOUNTER — Telehealth: Payer: Self-pay | Admitting: Obstetrics and Gynecology

## 2013-03-07 NOTE — Telephone Encounter (Signed)
Spoke with pt. Has had heartburn for 1 week. Advised OTC Zantac but pt wants something Rx that Medicaid will pay for. What do you advise? Thanks!!!

## 2013-03-08 MED ORDER — OMEPRAZOLE 20 MG PO CPDR: 20.0000 mg | DELAYED_RELEASE_CAPSULE | Freq: Every day | ORAL | Status: DC

## 2013-03-08 NOTE — Telephone Encounter (Signed)
Sent mychart email that I prescribed prilosec 20mg  qd

## 2013-03-15 ENCOUNTER — Encounter: Payer: Medicaid Other | Admitting: Advanced Practice Midwife

## 2013-03-19 ENCOUNTER — Encounter: Payer: Medicaid Other | Admitting: Women's Health

## 2013-03-20 ENCOUNTER — Ambulatory Visit (INDEPENDENT_AMBULATORY_CARE_PROVIDER_SITE_OTHER): Payer: Medicaid Other | Admitting: Women's Health

## 2013-03-20 VITALS — BP 98/58 | Wt 122.6 lb

## 2013-03-20 DIAGNOSIS — O09299 Supervision of pregnancy with other poor reproductive or obstetric history, unspecified trimester: Secondary | ICD-10-CM

## 2013-03-20 DIAGNOSIS — Z331 Pregnant state, incidental: Secondary | ICD-10-CM

## 2013-03-20 DIAGNOSIS — O0992 Supervision of high risk pregnancy, unspecified, second trimester: Secondary | ICD-10-CM

## 2013-03-20 DIAGNOSIS — O24319 Unspecified pre-existing diabetes mellitus in pregnancy, unspecified trimester: Secondary | ICD-10-CM

## 2013-03-20 DIAGNOSIS — O98519 Other viral diseases complicating pregnancy, unspecified trimester: Secondary | ICD-10-CM

## 2013-03-20 DIAGNOSIS — O24312 Unspecified pre-existing diabetes mellitus in pregnancy, second trimester: Secondary | ICD-10-CM

## 2013-03-20 DIAGNOSIS — O99810 Abnormal glucose complicating pregnancy: Secondary | ICD-10-CM

## 2013-03-20 DIAGNOSIS — Z1389 Encounter for screening for other disorder: Secondary | ICD-10-CM

## 2013-03-20 LAB — POCT URINALYSIS DIPSTICK
Blood, UA: NEGATIVE
Ketones, UA: NEGATIVE
Protein, UA: NEGATIVE

## 2013-03-20 MED ORDER — GLYBURIDE 2.5 MG PO TABS: 2.5000 mg | ORAL_TABLET | Freq: Two times a day (BID) | ORAL | Status: DC

## 2013-03-20 NOTE — Patient Instructions (Signed)
Type 1 or Type 2 Diabetes Mellitus During Pregnancy   Diabetes mellitus, often simply referred to as diabetes, is a long-term (chronic) disease. Type 1 diabetes occurs when the islet cells in the pancreas that make insulin (a hormone) are destroyed and can no longer make insulin. Type 2 diabetes occurs when the pancreas does not make enough insulin, the cells are less responsive to the insulin that is made (insulin resistance), or both. Insulin is needed to move sugars from food into the tissue cells. The tissue cells use the sugars for energy. The lack of insulin or the lack of normal response to insulin causes excess sugars to build up in the blood instead of going into the tissue cells. As a result, high blood sugar (hyperglycemia) develops.   If blood glucose levels are kept in the normal range both before and during pregnancy, women can have a healthy pregnancy. If your blood glucose levels are not well controlled, there may be risks to you, your unborn baby (fetus), your labor and delivery, or your newborn baby.   RISK FACTORS   · You are predisposed to developing type 1 diabetes if someone in your family has diabetes and you are exposed to certain environmental triggers.  · You have an increased chance of developing type 2 diabetes if you have a family history of diabetes and also have one or more of the following risk factors:  · Being overweight.  · Having an inactive lifestyle.  · Having a history of consistently eating high-calorie foods.   SYMPTOMS  The symptoms of diabetes include:  · Increased thirst (polydipsia).  · Increased urination (polyuria).  · Increased urination during the night (nocturia).  · Weight loss. This weight loss may be rapid.  · Frequent, recurring infections.  · Tiredness (fatigue).  · Weakness.  · Vision changes, such as blurred vision.  · Fruity smell to your breath.  · Abdominal pain.  · Nausea or vomiting.  DIAGNOSIS   Diabetes is diagnosed when blood glucose levels are  increased. Your blood glucose level may be checked by one or more of the following blood tests:  · A fasting blood glucose test.  You will not be allowed to eat for at least 8 hours before a blood sample is taken.  · A random blood glucose test. Your blood glucose is checked at any time of the day regardless of when you ate.  · A hemoglobin A1c blood glucose test. A hemoglobin A1c test provides information about blood glucose control over the previous 3 months.  · An oral glucose tolerance test (OGTT). Your blood glucose is measured after you have not eaten (fasted) for 1 3 hours and then after you drink a glucose-containing beverage. An OGTT is usually performed during weeks 24 28 of your pregnancy.  TREATMENT   · You will need to take diabetes medicine or insulin daily to keep blood glucose levels in the desired range.  · You will need to match insulin dosing with exercise and healthy food choices.   The treatment goal is to maintain the before-meal (preprandial), bedtime, and overnight blood glucose level at 60 99 mg/dL during pregnancy. The treatment goal is to further maintain the peak after-meal blood sugar (postprandial glucose) level at 100 140 mg/dL.    HOME CARE INSTRUCTIONS   · Have your hemoglobin A1c level checked twice a year.  · Perform daily blood glucose monitoring as directed by your caregiver. It is common to perform frequent blood glucose monitoring.    · Monitor urine ketones when   you are ill and as directed by your caregiver.  · Take your diabetes medicine and insulin as directed by your caregiver to maintain your blood glucose level in the desired range.  · Never run out of diabetes medicine or insulin. It is needed every day.  · Adjust insulin based on your intake of carbohydrates. Carbohydrates can raise blood glucose levels but need to be included in your diet. Carbohydrates provide vitamins, minerals, and fiber, which are an essential part of a healthy diet. Carbohydrates are found in  fruits, vegetables, whole grains, dairy products, legumes, and foods containing added sugars.  · Eat healthy foods. Alternate 3 meals with 3 snacks.  · Maintain a healthy weight gain. The usual total expected weight gain varies according to your prepregnancy body mass index (BMI).   · Carry a medical alert card or wear medical alert jewelry.  · Carry a 15 gram carbohydrate snack with you at all times to treat low blood sugar (hypoglycemia). Some examples of 15 gram carbohydrate snacks include:  · Glucose tablets, 3 or 4.  · Glucose gel, 15 gram tube.  · Raisins, 2 tablespoons (24 grams).  · Jelly beans, 6.  · Animal crackers, 8.  · Fruit juice, regular soda, or low fat milk, 4 ounces (120 mL).  · Gummy treats, 9.  · Recognize hypoglycemia. Hypoglycemia during pregnancy occurs with blood glucose levels of 60 mg/dL and below. The risk for hypoglycemia increases when fasting or skipping meals, during or after intense exercise, and during sleep. Hypoglycemia symptoms can include:  · Tremors or shakes.  · Decreased ability to concentrate.  · Sweating.  · Increased heart rate.  · Headache.  · Dry mouth.  · Hunger.  · Irritability.  · Anxiety.  · Restless sleep.  · Altered speech or coordination.  · Confusion.  · Treat hypoglycemia promptly. If you are alert and able to safely swallow, follow the 15:15 rule:  · Take 15 20 grams of rapid-acting glucose or carbohydrate. Rapid-acting options include glucose gel, glucose tablets, or 4 ounces (120 mL) of fruit juice, regular soda, or low-fat milk.  · Check your blood glucose level 15 minutes after taking the glucose.  · Take 15 20 grams more of glucose if the repeat blood glucose level is still 70 mg/dL or below.  · Eat a meal or snack within 1 hour once blood glucose levels return to normal.  · Engage in at least 30 minutes of physical activity a day or as directed by your caregiver. Ten minutes of physical activity timed 30 minutes after each meal is encouraged to control  postprandial blood glucose levels.    · Be alert to polyuria and polydipsia, which are early signs of hyperglycemia. An early awareness of hyperglycemia allows for prompt treatment. Treat hyperglycemia as directed by your caregiver.  · Adjust your insulin dosing and food intake as needed if you start a new exercise or sport.  · Follow your sick day plan at any time you are unable to eat or drink as usual.  · Avoid tobacco and alcohol use.  · Follow up with your caregiver regularly.  · Follow the advice of your caregiver regarding your prenatal and post-delivery (postpartum) appointments, meal planning, exercise, medicines, vitamins, blood tests, other medical tests, and physical activities.  · Continue daily skin and foot care. Examine your skin and feet daily for cuts, bruises, redness, nail problems, bleeding, blisters, or sores. A foot exam by a caregiver should be done annually.  · Brush   your teeth and gums at least twice a day and floss at least once a day. Follow up with your dentist regularly.  · Schedule an eye exam during the first trimester of your pregnancy or as directed by your caregiver.  · Share your diabetes management plan with your workplace or school.  · Stay up-to-date with immunizations.  · Learn to manage stress.  · Obtain ongoing diabetes education and support as needed.  SEEK MEDICAL CARE IF:   · You are unable to eat food or drink fluids for more than 6 hours.  · You have nausea and vomiting for more than 6 hours.  · You have a blood glucose level of 200 mg/dL and you have ketones in your urine.  · There is a change in mental status.  · You develop vision problems.  · You have a persistent headache.  · You have upper abdominal pain or discomfort.  · You develop an additional serious illness.  · You have diarrhea for more than 6 hours.  · You have been sick or have had a fever for a couple of days and are not getting better.  SEEK IMMEDIATE MEDICAL CARE IF:  · You have difficulty  breathing.  · You no longer feel the baby moving.  · You are bleeding or have discharge from your vagina.  · You start having premature contractions or labor.  MAKE SURE YOU:  · Understand these instructions.  · Will watch your condition.  · Will get help right away if you are not doing well or get worse.  Document Released: 02/16/2012 Document Revised: 05/10/2012 Document Reviewed: 02/16/2012  [employer]® Patient Information ©2014 [employer].

## 2013-03-20 NOTE — Progress Notes (Signed)
Reports good fm. Denies uc's, lof, vb, urinary frequency, urgency, hesitancy, or dysuria.  Hasn't been checking cbg's QID, states she forgets, didn't bring log, reports FBS 100, 2hr PP 120-170. Still not compliant w/ diet.  Reiterated importance of diet, checking cbg's, and tight control during pregnancy, and potential complications if not controlled.  Rx Glyburide 2.5mg  BID w/ meal, check cbg's QID and bring log to each appt. Rx Reviewed ptl s/s, fm.  All questions answered. F/U in 2wks for growth u/s and visit.

## 2013-03-20 NOTE — Progress Notes (Signed)
No complaints at this time.

## 2013-04-03 ENCOUNTER — Ambulatory Visit (INDEPENDENT_AMBULATORY_CARE_PROVIDER_SITE_OTHER): Payer: Medicaid Other

## 2013-04-03 ENCOUNTER — Ambulatory Visit (INDEPENDENT_AMBULATORY_CARE_PROVIDER_SITE_OTHER): Payer: Medicaid Other | Admitting: Women's Health

## 2013-04-03 ENCOUNTER — Encounter: Payer: Self-pay | Admitting: Women's Health

## 2013-04-03 ENCOUNTER — Other Ambulatory Visit: Payer: Self-pay | Admitting: Women's Health

## 2013-04-03 VITALS — BP 100/62 | Wt 123.5 lb

## 2013-04-03 DIAGNOSIS — O24319 Unspecified pre-existing diabetes mellitus in pregnancy, unspecified trimester: Secondary | ICD-10-CM

## 2013-04-03 DIAGNOSIS — O0992 Supervision of high risk pregnancy, unspecified, second trimester: Secondary | ICD-10-CM

## 2013-04-03 DIAGNOSIS — O24312 Unspecified pre-existing diabetes mellitus in pregnancy, second trimester: Secondary | ICD-10-CM

## 2013-04-03 DIAGNOSIS — Z1389 Encounter for screening for other disorder: Secondary | ICD-10-CM

## 2013-04-03 DIAGNOSIS — Z331 Pregnant state, incidental: Secondary | ICD-10-CM

## 2013-04-03 DIAGNOSIS — O98519 Other viral diseases complicating pregnancy, unspecified trimester: Secondary | ICD-10-CM

## 2013-04-03 DIAGNOSIS — O09299 Supervision of pregnancy with other poor reproductive or obstetric history, unspecified trimester: Secondary | ICD-10-CM

## 2013-04-03 DIAGNOSIS — O99810 Abnormal glucose complicating pregnancy: Secondary | ICD-10-CM

## 2013-04-03 LAB — POCT URINALYSIS DIPSTICK
Glucose, UA: NEGATIVE
Ketones, UA: NEGATIVE

## 2013-04-03 NOTE — Progress Notes (Signed)
Reports good fm. Denies uc's, lof, vb, urinary frequency, urgency, hesitancy, or dysuria.  No complaints.  Didn't bring log today, but reports FBS all <88, 2hr pp all <118, taking glyburide 2.5mg  BID. Doing better w/ carbs, but still not great.  Reviewed today's u/s, ptl s/s, fm.  All questions answered. F/U in 4wks for growth u/s, pn2 (w/o 2hr gtt), and visit.  Reminded to bring log to all visits.

## 2013-04-03 NOTE — Progress Notes (Signed)
U/S(24+0wks)- breech active fetus, appropriate growth EFW 1 lb 7 oz(45th%tile), fluid wnl, posterior gr 1 plac, cx long and closed 3.2cm, female fetus

## 2013-04-03 NOTE — Patient Instructions (Signed)
Pregnancy - Second Trimester The second trimester of pregnancy (3 to 6 months) is a period of rapid growth for you and your baby. At the end of the sixth month, your baby is about 9 inches long and weighs 1 1/2 pounds. You will begin to feel the baby move between 18 and 20 weeks of the pregnancy. This is called quickening. Weight gain is faster. A clear fluid (colostrum) may leak out of your breasts. You may feel small contractions of the womb (uterus). This is known as false labor or Braxton-Hicks contractions. This is like a practice for labor when the baby is ready to be born. Usually, the problems with morning sickness have usually passed by the end of your first trimester. Some women develop small dark blotches (called cholasma, mask of pregnancy) on their face that usually goes away after the baby is born. Exposure to the sun makes the blotches worse. Acne may also develop in some pregnant women and pregnant women who have acne, may find that it goes away. PRENATAL EXAMS  Blood work may continue to be done during prenatal exams. These tests are done to check on your health and the probable health of your baby. Blood work is used to follow your blood levels (hemoglobin). Anemia (low hemoglobin) is common during pregnancy. Iron and vitamins are given to help prevent this. You will also be checked for diabetes between 24 and 28 weeks of the pregnancy. Some of the previous blood tests may be repeated.  The size of the uterus is measured during each visit. This is to make sure that the baby is continuing to grow properly according to the dates of the pregnancy.  Your blood pressure is checked every prenatal visit. This is to make sure you are not getting toxemia.  Your urine is checked to make sure you do not have an infection, diabetes or protein in the urine.  Your weight is checked often to make sure gains are happening at the suggested rate. This is to ensure that both you and your baby are  growing normally.  Sometimes, an ultrasound is performed to confirm the proper growth and development of the baby. This is a test which bounces harmless sound waves off the baby so your caregiver can more accurately determine due dates. Sometimes, a test is done on the amniotic fluid surrounding the baby. This test is called an amniocentesis. The amniotic fluid is obtained by sticking a needle into the belly (abdomen). This is done to check the chromosomes in instances where there is a concern about possible genetic problems with the baby. It is also sometimes done near the end of pregnancy if an early delivery is required. In this case, it is done to help make sure the baby's lungs are mature enough for the baby to live outside of the womb. CHANGES OCCURING IN THE SECOND TRIMESTER OF PREGNANCY Your body goes through many changes during pregnancy. They vary from person to person. Talk to your caregiver about changes you notice that you are concerned about.  During the second trimester, you will likely have an increase in your appetite. It is normal to have cravings for certain foods. This varies from person to person and pregnancy to pregnancy.  Your lower abdomen will begin to bulge.  You may have to urinate more often because the uterus and baby are pressing on your bladder. It is also common to get more bladder infections during pregnancy. You can help this by drinking lots of fluids   and emptying your bladder before and after intercourse.  You may begin to get stretch marks on your hips, abdomen, and breasts. These are normal changes in the body during pregnancy. There are no exercises or medicines to take that prevent this change.  You may begin to develop swollen and bulging veins (varicose veins) in your legs. Wearing support hose, elevating your feet for 15 minutes, 3 to 4 times a day and limiting salt in your diet helps lessen the problem.  Heartburn may develop as the uterus grows and  pushes up against the stomach. Antacids recommended by your caregiver helps with this problem. Also, eating smaller meals 4 to 5 times a day helps.  Constipation can be treated with a stool softener or adding bulk to your diet. Drinking lots of fluids, and eating vegetables, fruits, and whole grains are helpful.  Exercising is also helpful. If you have been very active up until your pregnancy, most of these activities can be continued during your pregnancy. If you have been less active, it is helpful to start an exercise program such as walking.  Hemorrhoids may develop at the end of the second trimester. Warm sitz baths and hemorrhoid cream recommended by your caregiver helps hemorrhoid problems.  Backaches may develop during this time of your pregnancy. Avoid heavy lifting, wear low heal shoes, and practice good posture to help with backache problems.  Some pregnant women develop tingling and numbness of their hand and fingers because of swelling and tightening of ligaments in the wrist (carpel tunnel syndrome). This goes away after the baby is born.  As your breasts enlarge, you may have to get a bigger bra. Get a comfortable, cotton, support bra. Do not get a nursing bra until the last month of the pregnancy if you will be nursing the baby.  You may get a dark line from your belly button to the pubic area called the linea nigra.  You may develop rosy cheeks because of increase blood flow to the face.  You may develop spider looking lines of the face, neck, arms, and chest. These go away after the baby is born. HOME CARE INSTRUCTIONS   It is extremely important to avoid all smoking, herbs, alcohol, and unprescribed drugs during your pregnancy. These chemicals affect the formation and growth of the baby. Avoid these chemicals throughout the pregnancy to ensure the delivery of a healthy infant.  Most of your home care instructions are the same as suggested for the first trimester of your  pregnancy. Keep your caregiver's appointments. Follow your caregiver's instructions regarding medicine use, exercise, and diet.  During pregnancy, you are providing food for you and your baby. Continue to eat regular, well-balanced meals. Choose foods such as meat, fish, milk and other low fat dairy products, vegetables, fruits, and whole-grain breads and cereals. Your caregiver will tell you of the ideal weight gain.  A physical sexual relationship may be continued up until near the end of pregnancy if there are no other problems. Problems could include early (premature) leaking of amniotic fluid from the membranes, vaginal bleeding, abdominal pain, or other medical or pregnancy problems.  Exercise regularly if there are no restrictions. Check with your caregiver if you are unsure of the safety of some of your exercises. The greatest weight gain will occur in the last 2 trimesters of pregnancy. Exercise will help you:  Control your weight.  Get you in shape for labor and delivery.  Lose weight after you have the baby.  Wear   a good support or jogging bra for breast tenderness during pregnancy. This may help if worn during sleep. Pads or tissues may be used in the bra if you are leaking colostrum.  Do not use hot tubs, steam rooms or saunas throughout the pregnancy.  Wear your seat belt at all times when driving. This protects you and your baby if you are in an accident.  Avoid raw meat, uncooked cheese, cat litter boxes, and soil used by cats. These carry germs that can cause birth defects in the baby.  The second trimester is also a good time to visit your dentist for your dental health if this has not been done yet. Getting your teeth cleaned is okay. Use a soft toothbrush. Brush gently during pregnancy.  It is easier to leak urine during pregnancy. Tightening up and strengthening the pelvic muscles will help with this problem. Practice stopping your urination while you are going to the  bathroom. These are the same muscles you need to strengthen. It is also the muscles you would use as if you were trying to stop from passing gas. You can practice tightening these muscles up 10 times a set and repeating this about 3 times per day. Once you know what muscles to tighten up, do not perform these exercises during urination. It is more likely to contribute to an infection by backing up the urine.  Ask for help if you have financial, counseling, or nutritional needs during pregnancy. Your caregiver will be able to offer counseling for these needs as well as refer you for other special needs.  Your skin may become oily. If so, wash your face with mild soap, use non-greasy moisturizer and oil or cream based makeup. MEDICINES AND DRUG USE IN PREGNANCY  Take prenatal vitamins as directed. The vitamin should contain 1 milligram of folic acid. Keep all vitamins out of reach of children. Only a couple vitamins or tablets containing iron may be fatal to a baby or young child when ingested.  Avoid use of all medicines, including herbs, over-the-counter medicines, not prescribed or suggested by your caregiver. Only take over-the-counter or prescription medicines for pain, discomfort, or fever as directed by your caregiver. Do not use aspirin.  Let your caregiver also know about herbs you may be using.  Alcohol is related to a number of birth defects. This includes fetal alcohol syndrome. All alcohol, in any form, should be avoided completely. Smoking will cause low birth rate and premature babies.  Street or illegal drugs are very harmful to the baby. They are absolutely forbidden. A baby born to an addicted mother will be addicted at birth. The baby will go through the same withdrawal an adult does. SEEK MEDICAL CARE IF:  You have any concerns or worries during your pregnancy. It is better to call with your questions if you feel they cannot wait, rather than worry about them. SEEK IMMEDIATE  MEDICAL CARE IF:   An unexplained oral temperature above 102 F (38.9 C) develops, or as your caregiver suggests.  You have leaking of fluid from the vagina (birth canal). If leaking membranes are suspected, take your temperature and tell your caregiver of this when you call.  There is vaginal spotting, bleeding, or passing clots. Tell your caregiver of the amount and how many pads are used. Light spotting in pregnancy is common, especially following intercourse.  You develop a bad smelling vaginal discharge with a change in the color from clear to white.  You continue to feel   sick to your stomach (nauseated) and have no relief from remedies suggested. You vomit blood or coffee ground-like materials.  You lose more than 2 pounds of weight or gain more than 2 pounds of weight over 1 week, or as suggested by your caregiver.  You notice swelling of your face, hands, feet, or legs.  You get exposed to German measles and have never had them.  You are exposed to fifth disease or chickenpox.  You develop belly (abdominal) pain. Round ligament discomfort is a common non-cancerous (benign) cause of abdominal pain in pregnancy. Your caregiver still must evaluate you.  You develop a bad headache that does not go away.  You develop fever, diarrhea, pain with urination, or shortness of breath.  You develop visual problems, blurry, or double vision.  You fall or are in a car accident or any kind of trauma.  There is mental or physical violence at home. Document Released: 05/18/2001 Document Revised: 02/16/2012 Document Reviewed: 11/20/2008 [employer] Patient Information 2014 [employer].  

## 2013-04-12 ENCOUNTER — Other Ambulatory Visit: Payer: Self-pay

## 2013-05-01 ENCOUNTER — Ambulatory Visit (INDEPENDENT_AMBULATORY_CARE_PROVIDER_SITE_OTHER): Payer: Medicaid Other

## 2013-05-01 ENCOUNTER — Other Ambulatory Visit: Payer: Self-pay | Admitting: Women's Health

## 2013-05-01 ENCOUNTER — Encounter: Payer: Self-pay | Admitting: Women's Health

## 2013-05-01 ENCOUNTER — Ambulatory Visit (INDEPENDENT_AMBULATORY_CARE_PROVIDER_SITE_OTHER): Payer: Medicaid Other | Admitting: Women's Health

## 2013-05-01 VITALS — BP 98/56 | Wt 128.0 lb

## 2013-05-01 DIAGNOSIS — O98519 Other viral diseases complicating pregnancy, unspecified trimester: Secondary | ICD-10-CM

## 2013-05-01 DIAGNOSIS — B3731 Acute candidiasis of vulva and vagina: Secondary | ICD-10-CM

## 2013-05-01 DIAGNOSIS — O99810 Abnormal glucose complicating pregnancy: Secondary | ICD-10-CM

## 2013-05-01 DIAGNOSIS — O0992 Supervision of high risk pregnancy, unspecified, second trimester: Secondary | ICD-10-CM

## 2013-05-01 DIAGNOSIS — O0993 Supervision of high risk pregnancy, unspecified, third trimester: Secondary | ICD-10-CM

## 2013-05-01 DIAGNOSIS — O24319 Unspecified pre-existing diabetes mellitus in pregnancy, unspecified trimester: Secondary | ICD-10-CM

## 2013-05-01 DIAGNOSIS — Z1389 Encounter for screening for other disorder: Secondary | ICD-10-CM

## 2013-05-01 DIAGNOSIS — B373 Candidiasis of vulva and vagina: Secondary | ICD-10-CM

## 2013-05-01 DIAGNOSIS — N898 Other specified noninflammatory disorders of vagina: Secondary | ICD-10-CM

## 2013-05-01 DIAGNOSIS — O09299 Supervision of pregnancy with other poor reproductive or obstetric history, unspecified trimester: Secondary | ICD-10-CM

## 2013-05-01 DIAGNOSIS — Z331 Pregnant state, incidental: Secondary | ICD-10-CM

## 2013-05-01 DIAGNOSIS — O2390 Unspecified genitourinary tract infection in pregnancy, unspecified trimester: Secondary | ICD-10-CM

## 2013-05-01 LAB — POCT URINALYSIS DIPSTICK
Blood, UA: NEGATIVE
Ketones, UA: NEGATIVE
Leukocytes, UA: NEGATIVE
Protein, UA: NEGATIVE

## 2013-05-01 LAB — POCT WET PREP (WET MOUNT): Clue Cells Wet Prep Whiff POC: NEGATIVE

## 2013-05-01 MED ORDER — FLUCONAZOLE 150 MG PO TABS: 150.0000 mg | ORAL_TABLET | Freq: Once | ORAL | Status: DC

## 2013-05-01 NOTE — Patient Instructions (Signed)
Third Trimester of Pregnancy  The third trimester is from week 29 through week 42, months 7 through 9. The third trimester is a time when the fetus is growing rapidly. At the end of the ninth month, the fetus is about 20 inches in length and weighs 6 10 pounds.   BODY CHANGES  Your body goes through many changes during pregnancy. The changes vary from woman to woman.    Your weight will continue to increase. You can expect to gain 25 35 pounds (11 16 kg) by the end of the pregnancy.   You may begin to get stretch marks on your hips, abdomen, and breasts.   You may urinate more often because the fetus is moving lower into your pelvis and pressing on your bladder.   You may develop or continue to have heartburn as a result of your pregnancy.   You may develop constipation because certain hormones are causing the muscles that push waste through your intestines to slow down.   You may develop hemorrhoids or swollen, bulging veins (varicose veins).   You may have pelvic pain because of the weight gain and pregnancy hormones relaxing your joints between the bones in your pelvis. Back aches may result from over exertion of the muscles supporting your posture.   Your breasts will continue to grow and be tender. A yellow discharge may leak from your breasts called colostrum.   Your belly button may stick out.   You may feel short of breath because of your expanding uterus.   You may notice the fetus "dropping," or moving lower in your abdomen.   You may have a bloody mucus discharge. This usually occurs a few days to a week before labor begins.   Your cervix becomes thin and soft (effaced) near your due date.  WHAT TO EXPECT AT YOUR PRENATAL EXAMS   You will have prenatal exams every 2 weeks until week 36. Then, you will have weekly prenatal exams. During a routine prenatal visit:   You will be weighed to make sure you and the fetus are growing normally.   Your blood pressure is taken.   Your abdomen will be  measured to track your baby's growth.   The fetal heartbeat will be listened to.   Any test results from the previous visit will be discussed.   You may have a cervical check near your due date to see if you have effaced.  At around 36 weeks, your caregiver will check your cervix. At the same time, your caregiver will also perform a test on the secretions of the vaginal tissue. This test is to determine if a type of bacteria, Group B streptococcus, is present. Your caregiver will explain this further.  Your caregiver may ask you:   What your birth plan is.   How you are feeling.   If you are feeling the baby move.   If you have had any abnormal symptoms, such as leaking fluid, bleeding, severe headaches, or abdominal cramping.   If you have any questions.  Other tests or screenings that may be performed during your third trimester include:   Blood tests that check for low iron levels (anemia).   Fetal testing to check the health, activity level, and growth of the fetus. Testing is done if you have certain medical conditions or if there are problems during the pregnancy.  FALSE LABOR  You may feel small, irregular contractions that eventually go away. These are called Braxton Hicks contractions, or   false labor. Contractions may last for hours, days, or even weeks before true labor sets in. If contractions come at regular intervals, intensify, or become painful, it is best to be seen by your caregiver.   SIGNS OF LABOR    Menstrual-like cramps.   Contractions that are 5 minutes apart or less.   Contractions that start on the top of the uterus and spread down to the lower abdomen and back.   A sense of increased pelvic pressure or back pain.   A watery or bloody mucus discharge that comes from the vagina.  If you have any of these signs before the 37th week of pregnancy, call your caregiver right away. You need to go to the hospital to get checked immediately.  HOME CARE INSTRUCTIONS    Avoid all  smoking, herbs, alcohol, and unprescribed drugs. These chemicals affect the formation and growth of the baby.   Follow your caregiver's instructions regarding medicine use. There are medicines that are either safe or unsafe to take during pregnancy.   Exercise only as directed by your caregiver. Experiencing uterine cramps is a good sign to stop exercising.   Continue to eat regular, healthy meals.   Wear a good support bra for breast tenderness.   Do not use hot tubs, steam rooms, or saunas.   Wear your seat belt at all times when driving.   Avoid raw meat, uncooked cheese, cat litter boxes, and soil used by cats. These carry germs that can cause birth defects in the baby.   Take your prenatal vitamins.   Try taking a stool softener (if your caregiver approves) if you develop constipation. Eat more high-fiber foods, such as fresh vegetables or fruit and whole grains. Drink plenty of fluids to keep your urine clear or pale yellow.   Take warm sitz baths to soothe any pain or discomfort caused by hemorrhoids. Use hemorrhoid cream if your caregiver approves.   If you develop varicose veins, wear support hose. Elevate your feet for 15 minutes, 3 4 times a day. Limit salt in your diet.   Avoid heavy lifting, wear low heal shoes, and practice good posture.   Rest a lot with your legs elevated if you have leg cramps or low back pain.   Visit your dentist if you have not gone during your pregnancy. Use a soft toothbrush to brush your teeth and be gentle when you floss.   A sexual relationship may be continued unless your caregiver directs you otherwise.   Do not travel far distances unless it is absolutely necessary and only with the approval of your caregiver.   Take prenatal classes to understand, practice, and ask questions about the labor and delivery.   Make a trial run to the hospital.   Pack your hospital bag.   Prepare the baby's nursery.   Continue to go to all your prenatal visits as directed  by your caregiver.  SEEK MEDICAL CARE IF:   You are unsure if you are in labor or if your water has broken.   You have dizziness.   You have mild pelvic cramps, pelvic pressure, or nagging pain in your abdominal area.   You have persistent nausea, vomiting, or diarrhea.   You have a bad smelling vaginal discharge.   You have pain with urination.  SEEK IMMEDIATE MEDICAL CARE IF:    You have a fever.   You are leaking fluid from your vagina.   You have spotting or bleeding from your vagina.     You have severe abdominal cramping or pain.   You have rapid weight loss or gain.   You have shortness of breath with chest pain.   You notice sudden or extreme swelling of your face, hands, ankles, feet, or legs.   You have not felt your baby move in over an hour.   You have severe headaches that do not go away with medicine.   You have vision changes.  Document Released: 05/18/2001 Document Revised: 01/24/2013 Document Reviewed: 07/25/2012  [employer] Patient Information 2014 [employer].

## 2013-05-01 NOTE — Addendum Note (Signed)
Addended by: Cheral Marker on: 05/01/2013 02:11 PM   Modules accepted: Orders

## 2013-05-01 NOTE — Progress Notes (Signed)
U/S(28+0wks)-active vertex fetus, approp growth EFW 2 lb 10 oz (55th%tile), fluid wnl AFI-12.2cm, posterior Gr 1 placenta, cx-3.05cm closed, female fetus "[name]"

## 2013-05-01 NOTE — Progress Notes (Signed)
Reports good fm. Denies uc's, lof, vb, urinary frequency, urgency, hesitancy, or dysuria.  Vaginal itching x 2d, no abnormal d/c or odor. Spec exam: small amount thin white nonodorous d/c, wet prep: few yeast. rx diflucan. Didn't bring log, reports fbs all <90, 2hr pp w/ highest of 120.   Reviewedtoday's u/s, ptl s/s, fkc, to bring log to each visit!  All questions answered. F/U in 4wks for growth/afi/bpp u/s and visit.  To begin 2x/wk testing then.

## 2013-05-02 ENCOUNTER — Encounter: Payer: Medicaid Other | Admitting: Advanced Practice Midwife

## 2013-05-02 ENCOUNTER — Other Ambulatory Visit: Payer: Medicaid Other

## 2013-05-29 ENCOUNTER — Ambulatory Visit (INDEPENDENT_AMBULATORY_CARE_PROVIDER_SITE_OTHER): Payer: Medicaid Other | Admitting: Women's Health

## 2013-05-29 ENCOUNTER — Other Ambulatory Visit: Payer: Self-pay | Admitting: Obstetrics & Gynecology

## 2013-05-29 ENCOUNTER — Ambulatory Visit (INDEPENDENT_AMBULATORY_CARE_PROVIDER_SITE_OTHER): Payer: Medicaid Other

## 2013-05-29 VITALS — BP 90/44 | Wt 131.6 lb

## 2013-05-29 DIAGNOSIS — O99810 Abnormal glucose complicating pregnancy: Secondary | ICD-10-CM

## 2013-05-29 DIAGNOSIS — N898 Other specified noninflammatory disorders of vagina: Secondary | ICD-10-CM

## 2013-05-29 DIAGNOSIS — O24319 Unspecified pre-existing diabetes mellitus in pregnancy, unspecified trimester: Secondary | ICD-10-CM

## 2013-05-29 DIAGNOSIS — O0993 Supervision of high risk pregnancy, unspecified, third trimester: Secondary | ICD-10-CM

## 2013-05-29 DIAGNOSIS — O09299 Supervision of pregnancy with other poor reproductive or obstetric history, unspecified trimester: Secondary | ICD-10-CM

## 2013-05-29 DIAGNOSIS — O09899 Supervision of other high risk pregnancies, unspecified trimester: Secondary | ICD-10-CM

## 2013-05-29 DIAGNOSIS — O2390 Unspecified genitourinary tract infection in pregnancy, unspecified trimester: Secondary | ICD-10-CM

## 2013-05-29 DIAGNOSIS — Z1389 Encounter for screening for other disorder: Secondary | ICD-10-CM

## 2013-05-29 DIAGNOSIS — O98519 Other viral diseases complicating pregnancy, unspecified trimester: Secondary | ICD-10-CM

## 2013-05-29 DIAGNOSIS — Z331 Pregnant state, incidental: Secondary | ICD-10-CM

## 2013-05-29 LAB — POCT URINALYSIS DIPSTICK
Ketones, UA: NEGATIVE
Protein, UA: NEGATIVE

## 2013-05-29 LAB — POCT WET PREP (WET MOUNT)

## 2013-05-29 NOTE — Progress Notes (Signed)
Reports good fm. Denies uc's, lof, vb, urinary frequency, urgency, hesitancy, or dysuria.  Increased d/c, was yellow one day, denies odor or vulvovaginal itching/irritation. Spec exam mod amount creamy white nonodorous d/c, wet prep neg. All fbs <90 except for one 93, all 2hr pp <120, but has only been checking sugar BID. Didn't bring log today. Reiterated importance of checking cbg's QID and bringing log to each appt!  Reviewed today's u/s, ptl s/s, fkc.  All questions answered. To begin 2x/wk testing. Would normally come on Friday, but office is closed Thurs & Fri d/t Christmas, so to return on Monday to begin 2x/wk testing.

## 2013-05-29 NOTE — Patient Instructions (Signed)
Call the office or go to [facility] if:  You begin to have strong, frequent contractions  Your water breaks.  Sometimes it is a big gush of fluid, sometimes it is just a trickle that keeps getting your panties wet or running down your legs  You have vaginal bleeding.  It is normal to have a small amount of spotting if your cervix was checked.   You don't feel your baby moving like normal.  If you don't, get you something to eat and drink and lay down and focus on feeling your baby move.  You should feel at least 10 movements in 2 hours.  If you don't, you should call the office or [facility].   Preterm Labor Information Preterm labor is when labor starts at less than 37 weeks of pregnancy. The normal length of a pregnancy is 39 to 41 weeks. CAUSES Often, there is no identifiable underlying cause as to why a woman goes into preterm labor. One of the most common known causes of preterm labor is infection. Infections of the uterus, cervix, vagina, amniotic sac, bladder, kidney, or even the lungs (pneumonia) can cause labor to start. Other suspected causes of preterm labor include:   Urogenital infections, such as yeast infections and bacterial vaginosis.   Uterine abnormalities (uterine shape, uterine septum, fibroids, or bleeding from the placenta).   A cervix that has been operated on (it may fail to stay closed).   Malformations in the fetus.   Multiple gestations (twins, triplets, and so on).   Breakage of the amniotic sac.  RISK FACTORS  Having a previous history of preterm labor.   Having premature rupture of membranes (PROM).   Having a placenta that covers the opening of the cervix (placenta previa).   Having a placenta that separates from the uterus (placental abruption).   Having a cervix that is too weak to hold the fetus in the uterus (incompetent cervix).   Having too much fluid in the amniotic sac (polyhydramnios).   Taking illegal drugs  or smoking while pregnant.   Not gaining enough weight while pregnant.   Being younger than 4518 and older than 32 years old.   Having a low socioeconomic status.   Being African American. SYMPTOMS Signs and symptoms of preterm labor include:   Menstrual-like cramps, abdominal pain, or back pain.  Uterine contractions that are regular, as frequent as six in an hour, regardless of their intensity (may be mild or painful).  Contractions that start on the top of the uterus and spread down to the lower abdomen and back.   A sense of increased pelvic pressure.   A watery or bloody mucus discharge that comes from the vagina.  TREATMENT Depending on the length of the pregnancy and other circumstances, your health care provider may suggest bed rest. If necessary, there are medicines that can be given to stop contractions and to mature the fetal lungs. If labor happens before 34 weeks of pregnancy, a prolonged hospital stay may be recommended. Treatment depends on the condition of both you and the fetus.  WHAT SHOULD YOU DO IF YOU THINK YOU ARE IN PRETERM LABOR? Call your health care provider right away. You will need to go to the hospital to get checked immediately. HOW CAN YOU PREVENT PRETERM LABOR IN FUTURE PREGNANCIES? You should:   Stop smoking if you smoke.  Maintain healthy weight gain and avoid chemicals and drugs that are not necessary.  Be watchful for any type  of infection.  Inform your health care provider if you have a known history of preterm labor. Document Released: 08/14/2003 Document Revised: 01/24/2013 Document Reviewed: 06/26/2012 [employer] Patient Information 2014 [employer].  

## 2013-05-29 NOTE — Progress Notes (Signed)
U/S(32+0wks)-vtx active fetus, BPP 8/8, fluid wnl AFI-11.7cm, posterior Gr 2 placenta, female fetus, "[name]", FHR-167bpm, approp. Growth EFW 4 lb 6 oz (55th%tile)

## 2013-06-05 ENCOUNTER — Encounter: Payer: Medicaid Other | Admitting: Advanced Practice Midwife

## 2013-06-05 ENCOUNTER — Other Ambulatory Visit: Payer: Medicaid Other

## 2013-06-05 ENCOUNTER — Ambulatory Visit (INDEPENDENT_AMBULATORY_CARE_PROVIDER_SITE_OTHER): Payer: Medicaid Other | Admitting: Obstetrics & Gynecology

## 2013-06-05 ENCOUNTER — Encounter: Payer: Self-pay | Admitting: Obstetrics & Gynecology

## 2013-06-05 VITALS — BP 98/62 | Wt 134.5 lb

## 2013-06-05 DIAGNOSIS — O24319 Unspecified pre-existing diabetes mellitus in pregnancy, unspecified trimester: Secondary | ICD-10-CM

## 2013-06-05 DIAGNOSIS — Z1389 Encounter for screening for other disorder: Secondary | ICD-10-CM

## 2013-06-05 DIAGNOSIS — Z331 Pregnant state, incidental: Secondary | ICD-10-CM

## 2013-06-05 DIAGNOSIS — O99810 Abnormal glucose complicating pregnancy: Secondary | ICD-10-CM

## 2013-06-05 DIAGNOSIS — O98519 Other viral diseases complicating pregnancy, unspecified trimester: Secondary | ICD-10-CM

## 2013-06-05 DIAGNOSIS — O09299 Supervision of pregnancy with other poor reproductive or obstetric history, unspecified trimester: Secondary | ICD-10-CM

## 2013-06-05 DIAGNOSIS — O0993 Supervision of high risk pregnancy, unspecified, third trimester: Secondary | ICD-10-CM

## 2013-06-05 LAB — POCT URINALYSIS DIPSTICK
Glucose, UA: NEGATIVE
Ketones, UA: NEGATIVE
Nitrite, UA: NEGATIVE
Protein, UA: NEGATIVE

## 2013-06-05 NOTE — Progress Notes (Signed)
Reactive NST Needs growth scan around 36 weeks or so Pt does not need alternating sonograms, no hypertension and really just a functional B, so should be no chance of vascualr damage at this point Pt did not bring log, she says all of her BS have been good, am fastings less than 90 and 2 hours less than 120 BP weight and urine results all reviewed and noted. Patient reports good fetal movement, denies any bleeding and no rupture of membranes symptoms or regular contractions. Patient is without complaints. All questions were answered.

## 2013-06-07 NOTE — L&D Delivery Note (Signed)
Delivery Note At 6:05 PM a viable female was delivered via Vaginal, Spontaneous Delivery (Presentation: LOA; compound right hand  ).  APGAR: 9, 9; weight 5 lb 12.6 oz (2625 g).   Placenta status: Intact, Spontaneous.  Cord: 3 vessels with the following complications: None.    Anesthesia: None  Episiotomy: None Lacerations: None Suture Repair: none  Est. Blood Loss (mL): 200  Mom to postpartum.  Baby to Couplet care / Skin to Skin.  Tawnya CrookHogan, Heather Donovan 07/10/2013, 7:32 PM

## 2013-06-07 NOTE — L&D Delivery Note (Signed)
Attestation of Attending Supervision of Advanced Practitioner (CNM/NP): Evaluation and management procedures were performed by the Advanced Practitioner under my supervision and collaboration.  I have reviewed the Advanced Practitioner's note and chart, and I agree with the management and plan.  HARRAWAY-SMITH, Brigido Mera 9:24 AM     

## 2013-06-08 ENCOUNTER — Ambulatory Visit (INDEPENDENT_AMBULATORY_CARE_PROVIDER_SITE_OTHER): Payer: Medicaid Other

## 2013-06-08 ENCOUNTER — Other Ambulatory Visit: Payer: Self-pay | Admitting: Women's Health

## 2013-06-08 ENCOUNTER — Encounter: Payer: Medicaid Other | Admitting: Obstetrics and Gynecology

## 2013-06-08 DIAGNOSIS — O99810 Abnormal glucose complicating pregnancy: Secondary | ICD-10-CM

## 2013-06-08 DIAGNOSIS — O0993 Supervision of high risk pregnancy, unspecified, third trimester: Secondary | ICD-10-CM

## 2013-06-08 NOTE — Progress Notes (Signed)
U/S(33+3wks)-LIMITED u/s performed, active fetus, vtx presentation, fluid wnl AFI-9.0cm, posterior fundal Gr 1 placenta, FHR-161 bpm, female fetus "[name]"

## 2013-06-12 ENCOUNTER — Ambulatory Visit (INDEPENDENT_AMBULATORY_CARE_PROVIDER_SITE_OTHER): Payer: Medicaid Other | Admitting: Advanced Practice Midwife

## 2013-06-12 VITALS — BP 98/58 | Wt 134.2 lb

## 2013-06-12 DIAGNOSIS — Z331 Pregnant state, incidental: Secondary | ICD-10-CM

## 2013-06-12 DIAGNOSIS — O98519 Other viral diseases complicating pregnancy, unspecified trimester: Secondary | ICD-10-CM

## 2013-06-12 DIAGNOSIS — Z1389 Encounter for screening for other disorder: Secondary | ICD-10-CM

## 2013-06-12 DIAGNOSIS — O09299 Supervision of pregnancy with other poor reproductive or obstetric history, unspecified trimester: Secondary | ICD-10-CM

## 2013-06-12 DIAGNOSIS — O99810 Abnormal glucose complicating pregnancy: Secondary | ICD-10-CM

## 2013-06-12 LAB — POCT URINALYSIS DIPSTICK
Blood, UA: NEGATIVE
GLUCOSE UA: 1
Ketones, UA: NEGATIVE
NITRITE UA: NEGATIVE
Protein, UA: NEGATIVE

## 2013-06-12 LAB — GLUCOSE, POCT (MANUAL RESULT ENTRY): POC Glucose: 127 mg/dl — AB (ref 70–99)

## 2013-06-12 MED ORDER — ACYCLOVIR 400 MG PO TABS: 400.0000 mg | ORAL_TABLET | Freq: Three times a day (TID) | ORAL | Status: DC

## 2013-06-12 NOTE — Progress Notes (Signed)
Reactive NST today.  Blood sugars FBS <90 PP <120, but admits to not checking it very often.  Forgets to dot the 2 hour, so may try 1 hour PP <140.

## 2013-06-12 NOTE — Addendum Note (Signed)
Addended by: Jacklyn ShellRESENZO-DISHMON, Nathalee Smarr on: 06/12/2013 11:24 AM   Modules accepted: Orders

## 2013-06-14 ENCOUNTER — Encounter: Payer: Self-pay | Admitting: Advanced Practice Midwife

## 2013-06-14 ENCOUNTER — Ambulatory Visit (INDEPENDENT_AMBULATORY_CARE_PROVIDER_SITE_OTHER): Payer: Medicaid Other | Admitting: Advanced Practice Midwife

## 2013-06-14 VITALS — BP 104/62 | Wt 133.5 lb

## 2013-06-14 DIAGNOSIS — O0993 Supervision of high risk pregnancy, unspecified, third trimester: Secondary | ICD-10-CM

## 2013-06-14 DIAGNOSIS — O98519 Other viral diseases complicating pregnancy, unspecified trimester: Secondary | ICD-10-CM

## 2013-06-14 DIAGNOSIS — Z331 Pregnant state, incidental: Secondary | ICD-10-CM

## 2013-06-14 DIAGNOSIS — O09299 Supervision of pregnancy with other poor reproductive or obstetric history, unspecified trimester: Secondary | ICD-10-CM

## 2013-06-14 DIAGNOSIS — R768 Other specified abnormal immunological findings in serum: Secondary | ICD-10-CM

## 2013-06-14 DIAGNOSIS — O99810 Abnormal glucose complicating pregnancy: Secondary | ICD-10-CM

## 2013-06-14 DIAGNOSIS — Z1389 Encounter for screening for other disorder: Secondary | ICD-10-CM

## 2013-06-14 LAB — POCT URINALYSIS DIPSTICK
Blood, UA: NEGATIVE
GLUCOSE UA: NEGATIVE
Ketones, UA: NEGATIVE
Leukocytes, UA: NEGATIVE
Nitrite, UA: NEGATIVE
Protein, UA: NEGATIVE

## 2013-06-14 NOTE — Progress Notes (Signed)
NST reactive.  Blood sugars good per pt.  No c/O.  EFW u/s scheduled for 1/22.  F/U Monday for NST

## 2013-06-18 ENCOUNTER — Encounter: Payer: Self-pay | Admitting: Women's Health

## 2013-06-18 ENCOUNTER — Ambulatory Visit (INDEPENDENT_AMBULATORY_CARE_PROVIDER_SITE_OTHER): Payer: Medicaid Other | Admitting: Women's Health

## 2013-06-18 VITALS — BP 110/64 | Wt 132.5 lb

## 2013-06-18 DIAGNOSIS — O24319 Unspecified pre-existing diabetes mellitus in pregnancy, unspecified trimester: Secondary | ICD-10-CM

## 2013-06-18 DIAGNOSIS — I6783 Posterior reversible encephalopathy syndrome: Secondary | ICD-10-CM | POA: Insufficient documentation

## 2013-06-18 DIAGNOSIS — Z331 Pregnant state, incidental: Secondary | ICD-10-CM

## 2013-06-18 DIAGNOSIS — O09299 Supervision of pregnancy with other poor reproductive or obstetric history, unspecified trimester: Secondary | ICD-10-CM

## 2013-06-18 DIAGNOSIS — O0993 Supervision of high risk pregnancy, unspecified, third trimester: Secondary | ICD-10-CM

## 2013-06-18 DIAGNOSIS — O98519 Other viral diseases complicating pregnancy, unspecified trimester: Secondary | ICD-10-CM

## 2013-06-18 DIAGNOSIS — Z1389 Encounter for screening for other disorder: Secondary | ICD-10-CM

## 2013-06-18 DIAGNOSIS — O99810 Abnormal glucose complicating pregnancy: Secondary | ICD-10-CM

## 2013-06-18 LAB — POCT URINALYSIS DIPSTICK
Blood, UA: NEGATIVE
Glucose, UA: NEGATIVE
KETONES UA: NEGATIVE
Leukocytes, UA: NEGATIVE
Nitrite, UA: NEGATIVE
Protein, UA: NEGATIVE

## 2013-06-18 NOTE — Progress Notes (Signed)
Reports good fm. Denies uc's, lof, vb, urinary frequency, urgency, hesitancy, or dysuria.  Pressure when walking for long periods of time.  Brought log, but still not checking QID. All fbs <90, all 2hr pp 120 or less. Urged to check QID and bring loss/or meter to q visit. Has declined flu shot x 2, discussed/recommended again today- pt declined. 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 She has not mentioned this before, will review records. Reviewed ptl s/s, fkc.  All questions answered. F/U in 3d for nst and visit.

## 2013-06-18 NOTE — Patient Instructions (Signed)
Call the office or go to [facility] if:  You begin to have strong, frequent contractions  Your water breaks.  Sometimes it is a big gush of fluid, sometimes it is just a trickle that keeps getting your panties wet or running down your legs  You have vaginal bleeding.  It is normal to have a small amount of spotting if your cervix was checked.   You don't feel your baby moving like normal.  If you don't, get you something to eat and drink and lay down and focus on feeling your baby move.  You should feel at least 10 movements in 2 hours.  If you don't, you should call the office or go to [facility].   Preterm Labor Information Preterm labor is when labor starts at less than 37 weeks of pregnancy. The normal length of a pregnancy is 39 to 41 weeks. CAUSES Often, there is no identifiable underlying cause as to why a woman goes into preterm labor. One of the most common known causes of preterm labor is infection. Infections of the uterus, cervix, vagina, amniotic sac, bladder, kidney, or even the lungs (pneumonia) can cause labor to start. Other suspected causes of preterm labor include:   Urogenital infections, such as yeast infections and bacterial vaginosis.   Uterine abnormalities (uterine shape, uterine septum, fibroids, or bleeding from the placenta).   A cervix that has been operated on (it may fail to stay closed).   Malformations in the fetus.   Multiple gestations (twins, triplets, and so on).   Breakage of the amniotic sac.  RISK FACTORS  Having a previous history of preterm labor.   Having premature rupture of membranes (PROM).   Having a placenta that covers the opening of the cervix (placenta previa).   Having a placenta that separates from the uterus (placental abruption).   Having a cervix that is too weak to hold the fetus in the uterus (incompetent cervix).   Having too much fluid in the amniotic sac (polyhydramnios).   Taking illegal drugs  or smoking while pregnant.   Not gaining enough weight while pregnant.   Being younger than 4518 and older than 33 years old.   Having a low socioeconomic status.   Being African American. SYMPTOMS Signs and symptoms of preterm labor include:   Menstrual-like cramps, abdominal pain, or back pain.  Uterine contractions that are regular, as frequent as six in an hour, regardless of their intensity (may be mild or painful).  Contractions that start on the top of the uterus and spread down to the lower abdomen and back.   A sense of increased pelvic pressure.   A watery or bloody mucus discharge that comes from the vagina.  TREATMENT Depending on the length of the pregnancy and other circumstances, your health care provider may suggest bed rest. If necessary, there are medicines that can be given to stop contractions and to mature the fetal lungs. If labor happens before 34 weeks of pregnancy, a prolonged hospital stay may be recommended. Treatment depends on the condition of both you and the fetus.  WHAT SHOULD YOU DO IF YOU THINK YOU ARE IN PRETERM LABOR? Call your health care provider right away. You will need to go to the hospital to get checked immediately. HOW CAN YOU PREVENT PRETERM LABOR IN FUTURE PREGNANCIES? You should:   Stop smoking if you smoke.  Maintain healthy weight gain and avoid chemicals and drugs that are not necessary.  Be watchful for any type  of infection.  Inform your health care provider if you have a known history of preterm labor. Document Released: 08/14/2003 Document Revised: 01/24/2013 Document Reviewed: 06/26/2012 [facility] Patient Information 2014 [place] [place].

## 2013-06-19 LAB — CBC
HCT: 32.7 % — ABNORMAL LOW (ref 36.0–46.0)
HEMOGLOBIN: 11.1 g/dL — AB (ref 12.0–15.0)
MCH: 28.1 pg (ref 26.0–34.0)
MCHC: 33.9 g/dL (ref 30.0–36.0)
MCV: 82.8 fL (ref 78.0–100.0)
Platelets: 283 10*3/uL (ref 150–400)
RBC: 3.95 MIL/uL (ref 3.87–5.11)
RDW: 14.2 % (ref 11.5–15.5)
WBC: 10.3 10*3/uL (ref 4.0–10.5)

## 2013-06-19 LAB — RPR

## 2013-06-19 LAB — HIV ANTIBODY (ROUTINE TESTING W REFLEX): HIV: NONREACTIVE

## 2013-06-21 ENCOUNTER — Ambulatory Visit (INDEPENDENT_AMBULATORY_CARE_PROVIDER_SITE_OTHER): Payer: Medicaid Other | Admitting: Advanced Practice Midwife

## 2013-06-21 ENCOUNTER — Encounter: Payer: Self-pay | Admitting: Advanced Practice Midwife

## 2013-06-21 VITALS — BP 98/60 | Wt 134.0 lb

## 2013-06-21 DIAGNOSIS — O99810 Abnormal glucose complicating pregnancy: Secondary | ICD-10-CM

## 2013-06-21 DIAGNOSIS — O0990 Supervision of high risk pregnancy, unspecified, unspecified trimester: Secondary | ICD-10-CM

## 2013-06-21 DIAGNOSIS — O98519 Other viral diseases complicating pregnancy, unspecified trimester: Secondary | ICD-10-CM

## 2013-06-21 DIAGNOSIS — O99019 Anemia complicating pregnancy, unspecified trimester: Secondary | ICD-10-CM

## 2013-06-21 DIAGNOSIS — O09299 Supervision of pregnancy with other poor reproductive or obstetric history, unspecified trimester: Secondary | ICD-10-CM

## 2013-06-21 DIAGNOSIS — Z1389 Encounter for screening for other disorder: Secondary | ICD-10-CM

## 2013-06-21 DIAGNOSIS — Z331 Pregnant state, incidental: Secondary | ICD-10-CM

## 2013-06-21 DIAGNOSIS — O24919 Unspecified diabetes mellitus in pregnancy, unspecified trimester: Secondary | ICD-10-CM

## 2013-06-21 LAB — POCT URINALYSIS DIPSTICK
Blood, UA: NEGATIVE
Glucose, UA: NEGATIVE
KETONES UA: NEGATIVE
LEUKOCYTES UA: NEGATIVE
NITRITE UA: NEGATIVE
PROTEIN UA: NEGATIVE

## 2013-06-21 NOTE — Progress Notes (Signed)
NST reactive.  BS <90 fasting and <120 pp, checking         . C/O pressure, normal pregnancy aches.  F/U Monday for NST (wants GBS Monday with female--OK d/t IOL at 39 weeks).   and Thursday for EFW.

## 2013-06-25 ENCOUNTER — Ambulatory Visit (INDEPENDENT_AMBULATORY_CARE_PROVIDER_SITE_OTHER): Payer: Medicaid Other | Admitting: Women's Health

## 2013-06-25 ENCOUNTER — Encounter: Payer: Self-pay | Admitting: Women's Health

## 2013-06-25 VITALS — BP 98/60 | Wt 134.0 lb

## 2013-06-25 DIAGNOSIS — O98519 Other viral diseases complicating pregnancy, unspecified trimester: Secondary | ICD-10-CM

## 2013-06-25 DIAGNOSIS — O99019 Anemia complicating pregnancy, unspecified trimester: Secondary | ICD-10-CM

## 2013-06-25 DIAGNOSIS — Z331 Pregnant state, incidental: Secondary | ICD-10-CM

## 2013-06-25 DIAGNOSIS — O0990 Supervision of high risk pregnancy, unspecified, unspecified trimester: Secondary | ICD-10-CM

## 2013-06-25 DIAGNOSIS — Z1389 Encounter for screening for other disorder: Secondary | ICD-10-CM

## 2013-06-25 DIAGNOSIS — O09299 Supervision of pregnancy with other poor reproductive or obstetric history, unspecified trimester: Secondary | ICD-10-CM

## 2013-06-25 DIAGNOSIS — O99810 Abnormal glucose complicating pregnancy: Secondary | ICD-10-CM

## 2013-06-25 LAB — POCT URINALYSIS DIPSTICK
Blood, UA: NEGATIVE
Glucose, UA: NEGATIVE
KETONES UA: NEGATIVE
Leukocytes, UA: NEGATIVE
Nitrite, UA: NEGATIVE
PROTEIN UA: NEGATIVE

## 2013-06-25 LAB — OB RESULTS CONSOLE GC/CHLAMYDIA
Chlamydia: NEGATIVE
GC PROBE AMP, GENITAL: NEGATIVE

## 2013-06-25 NOTE — Patient Instructions (Signed)
Preterm Labor Information Preterm labor is when labor starts at less than 37 weeks of pregnancy. The normal length of a pregnancy is 39 to 41 weeks. CAUSES Often, there is no identifiable underlying cause as to why a woman goes into preterm labor. One of the most common known causes of preterm labor is infection. Infections of the uterus, cervix, vagina, amniotic sac, bladder, kidney, or even the lungs (pneumonia) can cause labor to start. Other suspected causes of preterm labor include:   Urogenital infections, such as yeast infections and bacterial vaginosis.   Uterine abnormalities (uterine shape, uterine septum, fibroids, or bleeding from the placenta).   A cervix that has been operated on (it may fail to stay closed).   Malformations in the fetus.   Multiple gestations (twins, triplets, and so on).   Breakage of the amniotic sac.  RISK FACTORS  Having a previous history of preterm labor.   Having premature rupture of membranes (PROM).   Having a placenta that covers the opening of the cervix (placenta previa).   Having a placenta that separates from the uterus (placental abruption).   Having a cervix that is too weak to hold the fetus in the uterus (incompetent cervix).   Having too much fluid in the amniotic sac (polyhydramnios).   Taking illegal drugs or smoking while pregnant.   Not gaining enough weight while pregnant.   Being younger than 18 and older than 33 years old.   Having a low socioeconomic status.   Being African American. SYMPTOMS Signs and symptoms of preterm labor include:   Menstrual-like cramps, abdominal pain, or back pain.  Uterine contractions that are regular, as frequent as six in an hour, regardless of their intensity (may be mild or painful).  Contractions that start on the top of the uterus and spread down to the lower abdomen and back.   A sense of increased pelvic pressure.   A watery or bloody mucus discharge that  comes from the vagina.  TREATMENT Depending on the length of the pregnancy and other circumstances, your health care provider may suggest bed rest. If necessary, there are medicines that can be given to stop contractions and to mature the fetal lungs. If labor happens before 34 weeks of pregnancy, a prolonged hospital stay may be recommended. Treatment depends on the condition of both you and the fetus.  WHAT SHOULD YOU DO IF YOU THINK YOU ARE IN PRETERM LABOR? Call your health care provider right away. You will need to go to the hospital to get checked immediately. HOW CAN YOU PREVENT PRETERM LABOR IN FUTURE PREGNANCIES? You should:   Stop smoking if you smoke.  Maintain healthy weight gain and avoid chemicals and drugs that are not necessary.  Be watchful for any type of infection.  Inform your health care provider if you have a known history of preterm labor. Document Released: 08/14/2003 Document Revised: 01/24/2013 Document Reviewed: 06/26/2012 Exit[employer] Patient Information 2014 [employer].    

## 2013-06-25 NOTE — Progress Notes (Signed)
Reports good fm. Denies lof, vb, uti s/s. Scratchy throat and chest hurts when coughing, cough non-productive. Denies fever/chills, ha, body aches. Oropharynx w/o erythema or exudate. HRRR, LCTAB. Recommended trying claritin or zyrtec, gargle w/ warm salt water. If not better, or develops new sx to let us know. Didn't bring log, states she is checking cbg's though- reports all fbs <90 and all 2hr pp <120.  Requested SVE: 1.5/th/-3. Overall reactive NST, co-reviewed w/ JVF. Reviewed ptl s/s, fkc.  All questions answered. F/U in 3d for u/s efw/bpp and visit. GBS today.

## 2013-06-26 ENCOUNTER — Encounter (HOSPITAL_COMMUNITY): Payer: Self-pay

## 2013-06-26 ENCOUNTER — Encounter: Payer: Self-pay | Admitting: Women's Health

## 2013-06-26 ENCOUNTER — Inpatient Hospital Stay (HOSPITAL_COMMUNITY)
Admission: AD | Admit: 2013-06-26 | Discharge: 2013-06-26 | Disposition: A | Discharge status: Home / Self Care | Payer: Medicaid Other | Source: Ambulatory Visit | Attending: Obstetrics & Gynecology | Admitting: Obstetrics & Gynecology

## 2013-06-26 DIAGNOSIS — R52 Pain, unspecified: Secondary | ICD-10-CM | POA: Insufficient documentation

## 2013-06-26 DIAGNOSIS — O99891 Other specified diseases and conditions complicating pregnancy: Secondary | ICD-10-CM | POA: Insufficient documentation

## 2013-06-26 DIAGNOSIS — R05 Cough: Secondary | ICD-10-CM | POA: Insufficient documentation

## 2013-06-26 DIAGNOSIS — R509 Fever, unspecified: Secondary | ICD-10-CM | POA: Insufficient documentation

## 2013-06-26 DIAGNOSIS — R059 Cough, unspecified: Secondary | ICD-10-CM | POA: Insufficient documentation

## 2013-06-26 DIAGNOSIS — O9989 Other specified diseases and conditions complicating pregnancy, childbirth and the puerperium: Principal | ICD-10-CM

## 2013-06-26 DIAGNOSIS — J029 Acute pharyngitis, unspecified: Secondary | ICD-10-CM | POA: Insufficient documentation

## 2013-06-26 DIAGNOSIS — R6889 Other general symptoms and signs: Secondary | ICD-10-CM

## 2013-06-26 DIAGNOSIS — Z87891 Personal history of nicotine dependence: Secondary | ICD-10-CM | POA: Insufficient documentation

## 2013-06-26 HISTORY — DX: Unspecified convulsions: R56.9

## 2013-06-26 LAB — URINALYSIS, ROUTINE W REFLEX MICROSCOPIC
BILIRUBIN URINE: NEGATIVE
GLUCOSE, UA: NEGATIVE mg/dL
Hgb urine dipstick: NEGATIVE
Ketones, ur: NEGATIVE mg/dL
Nitrite: NEGATIVE
PH: 6.5 (ref 5.0–8.0)
PROTEIN: NEGATIVE mg/dL
Specific Gravity, Urine: <1.005 — ABNORMAL LOW (ref 1.005–1.030)
Urobilinogen, UA: 0.2 mg/dL (ref 0.0–1.0)

## 2013-06-26 LAB — URINE MICROSCOPIC-ADD ON

## 2013-06-26 LAB — GC/CHLAMYDIA PROBE AMP
CT Probe RNA: NEGATIVE
GC PROBE AMP APTIMA: NEGATIVE

## 2013-06-26 MED ORDER — OSELTAMIVIR PHOSPHATE 75 MG PO CAPS
75.0000 mg | ORAL_CAPSULE | Freq: Once | ORAL | Status: DC
  Filled 2013-06-26: qty 1

## 2013-06-26 MED ORDER — OSELTAMIVIR PHOSPHATE 75 MG PO CAPS: 75.0000 mg | ORAL_CAPSULE | Freq: Two times a day (BID) | ORAL | Status: DC

## 2013-06-26 MED ORDER — LACTATED RINGERS IV BOLUS (SEPSIS)
1000.0000 mL | Freq: Once | INTRAVENOUS | Status: AC
  Administered 2013-06-26: 1000 mL via INTRAVENOUS

## 2013-06-26 MED ORDER — ACETAMINOPHEN 500 MG PO TABS
1000.0000 mg | ORAL_TABLET | Freq: Once | ORAL | Status: AC
  Administered 2013-06-26: 1000 mg via ORAL
  Filled 2013-06-26: qty 2

## 2013-06-26 NOTE — MAU Provider Note (Signed)
Attestation of Attending Supervision of Fellow: Evaluation and management procedures were performed by the Fellow under my supervision and collaboration.  I have reviewed the Fellow's note and chart, and I agree with the management and plan.    

## 2013-06-26 NOTE — Discharge Instructions (Signed)
Influenza  H1N1 formerly called "swine flu" is a new influenza virus causing sickness in people. The H1N1 virus is different from seasonal influenza viruses. However, the H1N1 symptoms are similar to seasonal influenza and it is spread from person to person. You may be at higher risk for serious problems if you have underlying serious medical conditions. The CDC and the [employer] Health Organization are following reported cases around the world. CAUSES   The flu is thought to spread mainly person-to-person through coughing or sneezing of infected people.  A person may become infected by touching something with the virus on it and then touching their mouth or nose. SYMPTOMS   Fever.  Headache.  Tiredness.  Cough.  Sore throat.  Runny or stuffy nose.  Body aches.  Diarrhea and vomiting These symptoms are referred to as "flu-like symptoms." A lot of different illnesses, including the common cold, may have similar symptoms. DIAGNOSIS   There are tests that can tell if you have the H1N1 virus.  Confirmed cases of H1N1 will be reported to the state or local health department.  A doctor's exam may be needed to tell whether you have an infection that is a complication of the flu. HOME CARE INSTRUCTIONS   Stay informed. Visit the [facility] website for current recommendations. Visit [employer].tnwww.cdc.gov/H1N1flu/. You may also call 1-800-CDC-INFO (561-349-98551-2184420162).  Get help early if you develop any of the above symptoms.  If you are at high risk from complications of the flu, talk to your caregiver as soon as you develop flu-like symptoms. Those at higher risk for complications include:  People 65 years or older.  People with chronic medical conditions.  Pregnant women.  Young children.  Your caregiver may recommend antiviral medicine to help treat the flu.  If you get the flu, get plenty of rest, drink enough water and fluids to keep your urine clear or pale yellow, and avoid using alcohol or  tobacco.  You may take over-the-counter medicine to relieve the symptoms of the flu if your caregiver approves. (Never give aspirin to children or teenagers who have flu-like symptoms, particularly fever). TREATMENT  If you do get sick, antiviral drugs are available. These drugs can make your illness milder and make you feel better faster. Treatment should start soon after illness starts. It is only effective if taken within the first day of becoming ill. Only your caregiver can prescribe antiviral medication.  PREVENTION   Cover your nose and mouth with a tissue or your arm when you cough or sneeze. Throw the tissue away.  Wash your hands often with soap and warm water, especially after you cough or sneeze. Alcohol-based cleaners are also effective against germs.  Avoid touching your eyes, nose or mouth. This is one way germs spread.  Try to avoid contact with sick people. Follow public health advice regarding school closures. Avoid crowds.  Stay home if you get sick. Limit contact with others to keep from infecting them. People infected with the H1N1 virus may be able to infect others anywhere from 1 day before feeling sick to 5-7 days after getting flu symptoms.  An H1N1 vaccine is available to help protect against the virus. In addition to the H1N1 vaccine, you will need to be vaccinated for seasonal influenza. The H1N1 and seasonal vaccines may be given on the same day. The CDC especially recommends the H1N1 vaccine for:  Pregnant women.  People who live with or care for children younger than 636 months of age.  Health  care and emergency services personnel.  Persons between the ages of 426 months through 224 years of age.  People from ages 8925 through 3864 years who are at higher risk for H1N1 because of chronic health disorders or immune system problems. FACEMASKS In community and home settings, the use of facemasks and N95 respirators are not normally recommended. In certain  circumstances, a facemask or N95 respirator may be used for persons at increased risk of severe illness from influenza. Your caregiver can give additional recommendations for facemask use. IN CHILDREN, EMERGENCY WARNING SIGNS THAT NEED URGENT MEDICAL CARE:  Fast breathing or trouble breathing.  Bluish skin color.  Not drinking enough fluids.  Not waking up or not interacting normally.  Being so fussy that the child does not want to be held.  Your child has an oral temperature above 102 F (38.9 C), not controlled by medicine.  Your baby is older than 3 months with a rectal temperature of 102 F (38.9 C) or higher.  Your baby is 723 months old or younger with a rectal temperature of 100.4 F (38 C) or higher.  Flu-like symptoms improve but then return with fever and worse cough. IN ADULTS, EMERGENCY WARNING SIGNS THAT NEED URGENT MEDICAL CARE:  Difficulty breathing or shortness of breath.  Pain or pressure in the chest or abdomen.  Sudden dizziness.  Confusion.  Severe or persistent vomiting.  Bluish color.  You have a oral temperature above 102 F (38.9 C), not controlled by medicine.  Flu-like symptoms improve but return with fever and worse cough. SEEK IMMEDIATE MEDICAL CARE IF:  You or someone you know is experiencing any of the above symptoms. When you arrive at the emergency center, report that you think you have the flu. You may be asked to wear a mask and/or sit in a secluded area to protect others from getting sick. MAKE SURE YOU:   Understand these instructions.  Will watch your condition.  Will get help right away if you are not doing well or get worse. Some of this information courtesy of the CDC.  Document Released: 11/10/2007 Document Revised: 08/16/2011 Document Reviewed: 11/10/2007 [facility] Patient Information 2014 [employer].

## 2013-06-26 NOTE — MAU Note (Signed)
Patient states she has been getting sick since Sunday. Now has a sore throat, cough that causes chest to hurt, general body aches that are getting worse.  Denies fever, vomiting or diarrhea. Has occasional contractions. Denies bleeding or leaking and reports good fetal movement.

## 2013-06-26 NOTE — MAU Provider Note (Signed)
History     CSN: [id number]  Arrival date and time: 06/26/13 4098   None     Chief Complaint  Patient presents with  . Sore Throat  . Cough  . Generalized Body Aches   HPI [name] is a 33 y.o. [phone] at [redacted]w[redacted]d who presented to MAU c/o sore throat, cough, and myalgias starting on Sunday and worsening since.  Also endorses intermittent heartburn.  Denies fever (although febrile on arrival), SOB, and chest pain.  Has been taking throat lozenges with no relief.  No flu shot this year.  History of Eclampsia (previous pregnancy; BP stable this pregnancy), PDM (treated with Glyburide 2.5 mg), +HSV.  Feeling intermittent contractions.  Positive fetal movement.  Denies vaginal bleeding or leakage of fluid.  Past Medical History  Diagnosis Date  . HSV-2 infection   . H/O abnormal Pap smear   . Abnormal Pap smear   . BV (bacterial vaginosis) 08/28/2012  . Unplanned wanted pregnancy 11/21/2012  . Diabetes mellitus without complication   . Seizures     Had a bleed in her brain, after childbirth in 2009    History reviewed. No pertinent past surgical history.  Family History  Problem Relation Age of Onset  . COPD Maternal Grandmother     History  Substance Use Topics  . Smoking status: Former Smoker    Types: Cigarettes    Quit date: 06/08/1995  . Smokeless tobacco: Never Used  . Alcohol Use: No     Comment: occ    Allergies: No Known Allergies  Prescriptions prior to admission  Medication Sig Dispense Refill  . acetaminophen (TYLENOL) 500 MG tablet Take 1,000 mg by mouth every 6 (six) hours as needed.      Marland Kitchen acyclovir (ZOVIRAX) 400 MG tablet Take 1 tablet (400 mg total) by mouth 3 (three) times daily.  90 tablet  2  . flintstones complete (FLINTSTONES) 60 MG chewable tablet Chew 1 tablet by mouth daily.      Marland Kitchen glyBURIDE (DIABETA) 2.5 MG tablet Take 1 tablet (2.5 mg total) by mouth 2 (two) times daily with a meal.  60 tablet  3  . omeprazole (PRILOSEC) 20 MG capsule  Take 1 capsule (20 mg total) by mouth daily.  30 capsule  6    Review of Systems  Constitutional: Negative for fever.  HENT: Positive for sore throat.   Eyes: Negative for blurred vision.  Respiratory: Positive for cough. Negative for shortness of breath and wheezing.   Cardiovascular: Negative for chest pain and leg swelling.  Gastrointestinal: Positive for heartburn and abdominal pain (occasional contractions). Negative for vomiting and diarrhea.  Genitourinary: Negative for dysuria, urgency and frequency.  Musculoskeletal: Positive for myalgias.  Neurological: Negative for headaches.  All other systems reviewed and are negative.   Physical Exam   Blood pressure 122/72, pulse 116, temperature 100.5 F (38.1 C), temperature source Oral, resp. rate 20, height 4\' 10"  (1.473 m), weight 61.054 kg (134 lb 9.6 oz), SpO2 97.00%.  Physical Exam  Nursing note and vitals reviewed. Constitutional: She is oriented to person, place, and time. She appears well-developed and well-nourished.  HENT:  Head: Normocephalic and atraumatic.  Eyes: Pupils are equal, round, and reactive to light.  Cardiovascular: Regular rhythm.   Tachycardia.  Respiratory: Effort normal and breath sounds normal. No respiratory distress. She has no wheezes. She has no rales.  GI: There is no tenderness.  gravid  Musculoskeletal: She exhibits no edema.  Neurological: She is alert and  oriented to person, place, and time.  Skin: No rash noted.  Feels warm.  Psychiatric: She has a normal mood and affect. Her behavior is normal.   FHT: 160s initially improved to 150s, mod var, several 15x15 accels, no decles Toco: No ctx  MAU Course  Procedures  MDM UA Flu PCR  Assessment and Plan  #32 y.o. female [id number] 7810w0d #Flu-like symptoms:  Febrile and tachycardiac on arrival; Flu PCR pending; will ppx treat with Tamiflu, recommend Tylenol for fevers, start IV fluids 1L in MAU #History of Eclampsia:  Denies HA, vision  changes, AP or edema; normotensive on arrival; denies high blood pressures during this pregnancy; UA negative for protein  Reassuring FHT and improved with fluid. Will discharge patient with close follow up.  Darrell JewelUCKER, BRITTON L 06/26/2013, 8:17 PM   I spoke with and examined patient and agree with PA-S's note and plan of care.  Tawana ScaleMichael Ryan Fritzi Scripter, MD Ob Fellow 06/26/2013 10:04 PM

## 2013-06-27 ENCOUNTER — Telehealth: Payer: Self-pay

## 2013-06-27 ENCOUNTER — Telehealth: Payer: Self-pay | Admitting: Obstetrics & Gynecology

## 2013-06-27 ENCOUNTER — Encounter: Payer: Self-pay | Admitting: Women's Health

## 2013-06-27 LAB — STREP B DNA PROBE: STREP GROUP B AG: POSITIVE

## 2013-06-27 LAB — INFLUENZA PANEL BY PCR (TYPE A & B)
H1N1FLUPCR: NOT DETECTED
Influenza A By PCR: POSITIVE — AB
Influenza B By PCR: NEGATIVE

## 2013-06-27 NOTE — Telephone Encounter (Signed)
Spoke with pt. She was asking if she should still come in tomorrow for visit since she has the flu. I spoke with Dr. Despina HiddenEure and he advised to come in next week. Call transferred to front desk for appt on Monday for US and see provider. JSY

## 2013-06-27 NOTE — Telephone Encounter (Signed)
Spoke with pt. Pt is + for Influenza A. Advised to get Tamiflu filled and start taking it. Pt voiced understanding. JSY

## 2013-06-28 ENCOUNTER — Encounter: Payer: Medicaid Other | Admitting: Obstetrics & Gynecology

## 2013-06-28 ENCOUNTER — Other Ambulatory Visit: Payer: Medicaid Other

## 2013-07-02 ENCOUNTER — Other Ambulatory Visit: Payer: Self-pay | Admitting: Advanced Practice Midwife

## 2013-07-02 ENCOUNTER — Other Ambulatory Visit: Payer: Self-pay | Admitting: Obstetrics & Gynecology

## 2013-07-02 ENCOUNTER — Ambulatory Visit (INDEPENDENT_AMBULATORY_CARE_PROVIDER_SITE_OTHER): Payer: Medicaid Other | Admitting: Obstetrics & Gynecology

## 2013-07-02 ENCOUNTER — Encounter: Payer: Self-pay | Admitting: Obstetrics & Gynecology

## 2013-07-02 ENCOUNTER — Ambulatory Visit (INDEPENDENT_AMBULATORY_CARE_PROVIDER_SITE_OTHER): Payer: Medicaid Other

## 2013-07-02 VITALS — BP 110/60 | Wt 133.0 lb

## 2013-07-02 DIAGNOSIS — Z331 Pregnant state, incidental: Secondary | ICD-10-CM

## 2013-07-02 DIAGNOSIS — O99891 Other specified diseases and conditions complicating pregnancy: Secondary | ICD-10-CM

## 2013-07-02 DIAGNOSIS — O24919 Unspecified diabetes mellitus in pregnancy, unspecified trimester: Secondary | ICD-10-CM

## 2013-07-02 DIAGNOSIS — O98519 Other viral diseases complicating pregnancy, unspecified trimester: Secondary | ICD-10-CM

## 2013-07-02 DIAGNOSIS — O99810 Abnormal glucose complicating pregnancy: Secondary | ICD-10-CM

## 2013-07-02 DIAGNOSIS — O9989 Other specified diseases and conditions complicating pregnancy, childbirth and the puerperium: Secondary | ICD-10-CM

## 2013-07-02 DIAGNOSIS — Z1389 Encounter for screening for other disorder: Secondary | ICD-10-CM

## 2013-07-02 LAB — POCT URINALYSIS DIPSTICK
Blood, UA: NEGATIVE
GLUCOSE UA: NEGATIVE
Ketones, UA: NEGATIVE
LEUKOCYTES UA: NEGATIVE
Nitrite, UA: NEGATIVE
Protein, UA: NEGATIVE

## 2013-07-02 NOTE — Progress Notes (Signed)
See sonogram report  BP weight and urine results all reviewed and noted. Patient reports good fetal movement, denies any bleeding and no rupture of membranes symptoms or regular contractions. Patient is without complaints. All questions were answered.  

## 2013-07-02 NOTE — Progress Notes (Signed)
U/S(36+6wks)-vtx active fetus, BPP 8/8, fluid wnl AFI-6.6cm, posterior fundal Gr 2 placenta, unable to visualize cx, FHR-130 bpm, female fetus "[name]", EFW 6 lb7 oz (42nd%tile)

## 2013-07-02 NOTE — Addendum Note (Signed)
Addended by: Criss AlvinePULLIAM, CHRYSTAL G on: 07/02/2013 03:10 PM   Modules accepted: Orders

## 2013-07-05 ENCOUNTER — Encounter: Payer: Self-pay | Admitting: Obstetrics & Gynecology

## 2013-07-05 ENCOUNTER — Ambulatory Visit (INDEPENDENT_AMBULATORY_CARE_PROVIDER_SITE_OTHER): Payer: Medicaid Other | Admitting: Obstetrics & Gynecology

## 2013-07-05 VITALS — BP 90/60 | Wt 131.4 lb

## 2013-07-05 DIAGNOSIS — O99810 Abnormal glucose complicating pregnancy: Secondary | ICD-10-CM

## 2013-07-05 DIAGNOSIS — O99891 Other specified diseases and conditions complicating pregnancy: Secondary | ICD-10-CM

## 2013-07-05 DIAGNOSIS — Z331 Pregnant state, incidental: Secondary | ICD-10-CM

## 2013-07-05 DIAGNOSIS — O24919 Unspecified diabetes mellitus in pregnancy, unspecified trimester: Secondary | ICD-10-CM | POA: Insufficient documentation

## 2013-07-05 DIAGNOSIS — Z1389 Encounter for screening for other disorder: Secondary | ICD-10-CM

## 2013-07-05 DIAGNOSIS — IMO0001 Reserved for inherently not codable concepts without codable children: Secondary | ICD-10-CM

## 2013-07-05 DIAGNOSIS — O98519 Other viral diseases complicating pregnancy, unspecified trimester: Secondary | ICD-10-CM

## 2013-07-05 DIAGNOSIS — O9989 Other specified diseases and conditions complicating pregnancy, childbirth and the puerperium: Secondary | ICD-10-CM

## 2013-07-05 LAB — POCT URINALYSIS DIPSTICK
GLUCOSE UA: NEGATIVE
Ketones, UA: NEGATIVE
LEUKOCYTES UA: NEGATIVE
NITRITE UA: NEGATIVE
Protein, UA: NEGATIVE
RBC UA: NEGATIVE

## 2013-07-05 NOTE — Progress Notes (Signed)
Reactive NST  BP weight and urine results all reviewed and noted. Patient reports good fetal movement, denies any bleeding and no rupture of membranes symptoms or regular contractions. Patient is without complaints. All questions were answered.  Pt does not want a BTL, wants Depo  Induction scheduled for Tuesday 07/17/2013 7am

## 2013-07-06 ENCOUNTER — Telehealth (HOSPITAL_COMMUNITY): Payer: Self-pay | Admitting: *Deleted

## 2013-07-06 ENCOUNTER — Encounter (HOSPITAL_COMMUNITY): Payer: Self-pay | Admitting: *Deleted

## 2013-07-06 NOTE — Telephone Encounter (Signed)
Preadmission screen  

## 2013-07-09 ENCOUNTER — Encounter (HOSPITAL_COMMUNITY): Payer: Self-pay | Admitting: *Deleted

## 2013-07-09 ENCOUNTER — Encounter: Payer: Self-pay | Admitting: Women's Health

## 2013-07-09 ENCOUNTER — Ambulatory Visit (INDEPENDENT_AMBULATORY_CARE_PROVIDER_SITE_OTHER): Payer: Medicaid Other

## 2013-07-09 ENCOUNTER — Other Ambulatory Visit: Payer: Self-pay | Admitting: Women's Health

## 2013-07-09 ENCOUNTER — Other Ambulatory Visit: Payer: Medicaid Other | Admitting: Obstetrics and Gynecology

## 2013-07-09 ENCOUNTER — Inpatient Hospital Stay (HOSPITAL_COMMUNITY)
Admission: AD | Admit: 2013-07-09 | Discharge: 2013-07-12 | DRG: 774 | Disposition: A | Discharge status: Home / Self Care | Payer: Medicaid Other | Source: Ambulatory Visit | Attending: Obstetrics & Gynecology | Admitting: Obstetrics & Gynecology

## 2013-07-09 ENCOUNTER — Ambulatory Visit (INDEPENDENT_AMBULATORY_CARE_PROVIDER_SITE_OTHER): Payer: Medicaid Other | Admitting: Women's Health

## 2013-07-09 VITALS — BP 98/60 | Wt 131.0 lb

## 2013-07-09 DIAGNOSIS — O99891 Other specified diseases and conditions complicating pregnancy: Secondary | ICD-10-CM

## 2013-07-09 DIAGNOSIS — O368390 Maternal care for abnormalities of the fetal heart rate or rhythm, unspecified trimester, not applicable or unspecified: Secondary | ICD-10-CM

## 2013-07-09 DIAGNOSIS — O99810 Abnormal glucose complicating pregnancy: Secondary | ICD-10-CM

## 2013-07-09 DIAGNOSIS — O4100X0 Oligohydramnios, unspecified trimester, not applicable or unspecified: Secondary | ICD-10-CM

## 2013-07-09 DIAGNOSIS — O24919 Unspecified diabetes mellitus in pregnancy, unspecified trimester: Secondary | ICD-10-CM

## 2013-07-09 DIAGNOSIS — O98519 Other viral diseases complicating pregnancy, unspecified trimester: Secondary | ICD-10-CM | POA: Diagnosis present

## 2013-07-09 DIAGNOSIS — Z87891 Personal history of nicotine dependence: Secondary | ICD-10-CM

## 2013-07-09 DIAGNOSIS — Z1389 Encounter for screening for other disorder: Secondary | ICD-10-CM

## 2013-07-09 DIAGNOSIS — O289 Unspecified abnormal findings on antenatal screening of mother: Secondary | ICD-10-CM

## 2013-07-09 DIAGNOSIS — O9989 Other specified diseases and conditions complicating pregnancy, childbirth and the puerperium: Secondary | ICD-10-CM

## 2013-07-09 DIAGNOSIS — Z331 Pregnant state, incidental: Secondary | ICD-10-CM

## 2013-07-09 DIAGNOSIS — O288 Other abnormal findings on antenatal screening of mother: Secondary | ICD-10-CM

## 2013-07-09 DIAGNOSIS — O328XX0 Maternal care for other malpresentation of fetus, not applicable or unspecified: Secondary | ICD-10-CM | POA: Diagnosis present

## 2013-07-09 DIAGNOSIS — O99019 Anemia complicating pregnancy, unspecified trimester: Secondary | ICD-10-CM

## 2013-07-09 DIAGNOSIS — O99892 Other specified diseases and conditions complicating childbirth: Secondary | ICD-10-CM | POA: Diagnosis present

## 2013-07-09 DIAGNOSIS — O99814 Abnormal glucose complicating childbirth: Secondary | ICD-10-CM | POA: Diagnosis present

## 2013-07-09 DIAGNOSIS — O4103X0 Oligohydramnios, third trimester, not applicable or unspecified: Secondary | ICD-10-CM

## 2013-07-09 DIAGNOSIS — A6000 Herpesviral infection of urogenital system, unspecified: Secondary | ICD-10-CM | POA: Diagnosis present

## 2013-07-09 DIAGNOSIS — G40909 Epilepsy, unspecified, not intractable, without status epilepticus: Secondary | ICD-10-CM | POA: Diagnosis present

## 2013-07-09 DIAGNOSIS — O0990 Supervision of high risk pregnancy, unspecified, unspecified trimester: Secondary | ICD-10-CM

## 2013-07-09 DIAGNOSIS — O99354 Diseases of the nervous system complicating childbirth: Secondary | ICD-10-CM | POA: Diagnosis present

## 2013-07-09 DIAGNOSIS — O09299 Supervision of pregnancy with other poor reproductive or obstetric history, unspecified trimester: Secondary | ICD-10-CM

## 2013-07-09 DIAGNOSIS — Z22330 Carrier of Group B streptococcus: Secondary | ICD-10-CM

## 2013-07-09 DIAGNOSIS — L744 Anhidrosis: Secondary | ICD-10-CM | POA: Diagnosis present

## 2013-07-09 DIAGNOSIS — O24419 Gestational diabetes mellitus in pregnancy, unspecified control: Secondary | ICD-10-CM

## 2013-07-09 LAB — POCT URINALYSIS DIPSTICK
Blood, UA: NEGATIVE
Glucose, UA: NEGATIVE
Ketones, UA: NEGATIVE
Nitrite, UA: NEGATIVE
PROTEIN UA: NEGATIVE

## 2013-07-09 LAB — ABO/RH: ABO/RH(D): O POS

## 2013-07-09 LAB — GLUCOSE, CAPILLARY
GLUCOSE-CAPILLARY: 77 mg/dL (ref 70–99)
GLUCOSE-CAPILLARY: 71 mg/dL (ref 70–99)

## 2013-07-09 LAB — CBC
HCT: 35.7 % — ABNORMAL LOW (ref 36.0–46.0)
Hemoglobin: 12.3 g/dL (ref 12.0–15.0)
MCH: 28.4 pg (ref 26.0–34.0)
MCHC: 34.5 g/dL (ref 30.0–36.0)
MCV: 82.4 fL (ref 78.0–100.0)
Platelets: 307 10*3/uL (ref 150–400)
RBC: 4.33 MIL/uL (ref 3.87–5.11)
RDW: 14 % (ref 11.5–15.5)
WBC: 15 10*3/uL — AB (ref 4.0–10.5)

## 2013-07-09 LAB — TYPE AND SCREEN
ABO/RH(D): O POS
Antibody Screen: NEGATIVE

## 2013-07-09 LAB — RPR: RPR: NONREACTIVE

## 2013-07-09 MED ORDER — OXYTOCIN BOLUS FROM INFUSION: 500.0000 mL | INTRAVENOUS | Status: DC

## 2013-07-09 MED ORDER — CITRIC ACID-SODIUM CITRATE 334-500 MG/5ML PO SOLN: 30.0000 mL | ORAL | Status: DC | PRN

## 2013-07-09 MED ORDER — LACTATED RINGERS IV SOLN
INTRAVENOUS | Status: DC
  Administered 2013-07-09 – 2013-07-10 (×2): via INTRAVENOUS

## 2013-07-09 MED ORDER — OXYTOCIN 40 UNITS IN LACTATED RINGERS INFUSION - SIMPLE MED
62.5000 mL/h | INTRAVENOUS | Status: DC
  Administered 2013-07-10: 125 mL/h via INTRAVENOUS
  Filled 2013-07-09: qty 1000

## 2013-07-09 MED ORDER — OXYTOCIN 40 UNITS IN LACTATED RINGERS INFUSION - SIMPLE MED
1.0000 m[IU]/min | INTRAVENOUS | Status: DC
  Administered 2013-07-09: 2 m[IU]/min via INTRAVENOUS
  Filled 2013-07-09: qty 1000

## 2013-07-09 MED ORDER — LACTATED RINGERS IV SOLN: 500.0000 mL | INTRAVENOUS | Status: DC | PRN

## 2013-07-09 MED ORDER — TERBUTALINE SULFATE 1 MG/ML IJ SOLN: 0.2500 mg | Freq: Once | INTRAMUSCULAR | Status: AC | PRN

## 2013-07-09 MED ORDER — ONDANSETRON HCL 4 MG/2ML IJ SOLN: 4.0000 mg | Freq: Four times a day (QID) | INTRAMUSCULAR | Status: DC | PRN

## 2013-07-09 MED ORDER — OXYCODONE-ACETAMINOPHEN 5-325 MG PO TABS
1.0000 | ORAL_TABLET | ORAL | Status: DC | PRN
  Administered 2013-07-10: 1 via ORAL
  Filled 2013-07-09 (×2): qty 1

## 2013-07-09 MED ORDER — LIDOCAINE HCL (PF) 1 % IJ SOLN: 30.0000 mL | INTRAMUSCULAR | Status: DC | PRN

## 2013-07-09 MED ORDER — PENICILLIN G POTASSIUM 5000000 UNITS IJ SOLR: 5.0000 10*6.[IU] | Freq: Once | INTRAVENOUS | Status: DC

## 2013-07-09 MED ORDER — PENICILLIN G POTASSIUM 5000000 UNITS IJ SOLR
2.5000 10*6.[IU] | INTRAVENOUS | Status: DC
  Administered 2013-07-09 – 2013-07-10 (×6): 2.5 10*6.[IU] via INTRAVENOUS
  Filled 2013-07-09 (×9): qty 2.5

## 2013-07-09 MED ORDER — PENICILLIN G POTASSIUM 5000000 UNITS IJ SOLR
5.0000 10*6.[IU] | Freq: Once | INTRAVENOUS | Status: AC
  Administered 2013-07-09: 5 10*6.[IU] via INTRAVENOUS
  Filled 2013-07-09: qty 5

## 2013-07-09 MED ORDER — IBUPROFEN 600 MG PO TABS
600.0000 mg | ORAL_TABLET | Freq: Four times a day (QID) | ORAL | Status: DC | PRN
  Administered 2013-07-10: 600 mg via ORAL
  Filled 2013-07-09: qty 1

## 2013-07-09 MED ORDER — ACETAMINOPHEN 325 MG PO TABS
650.0000 mg | ORAL_TABLET | ORAL | Status: DC | PRN
  Administered 2013-07-10: 650 mg via ORAL
  Filled 2013-07-09: qty 2

## 2013-07-09 NOTE — Progress Notes (Signed)
U/S(37+6wks)-vtx active fetus, OLIGOHYDRAMNIOS AFI=1.7 & 2.6cm, BPP 6/10 (no fluid pocket noted >=2cm and NR NST),  FHR- 157 bpm, UA Doppler RI- 0.60 & 0.48, female fetus, posterior/Fundal Gr 2 placenta

## 2013-07-09 NOTE — H&P (Signed)
Attestation of Attending Supervision of Advanced Practitioner (CNM/NP): Evaluation and management procedures were performed by the Advanced Practitioner under my supervision and collaboration.  I have reviewed the Advanced Practitioner's note and chart, and I agree with the management and plan.  HARRAWAY-SMITH, Cacey Willow 4:50 PM

## 2013-07-09 NOTE — Progress Notes (Signed)
Reports good fm. Denies uc's, lof, vb, uti s/s. LBP, thinks lost part of mucous plug.  Requests SVE. Reports all fbs <90, all 2hr pp <120. NST: 10x10accels, but not getting 15x15s, will get bpp. BPP 6/10, -2 for fluid and nst. AFI 1.7 and repeat 2.6, no pocket >2cm, normal dopplers. [facility]. She would [facility] cousin[facility] @ 2pm[facility] [place] [facility] Reviewed labor s/s, fkc.  All questions answered. Notified BS Charge RN, admissions, and Deidre Poe CNM to be expecting pt.

## 2013-07-09 NOTE — H&P (Signed)
Bradley Ferrismanda R Seese is a 33 y.o. female [phone] @ 1517w6d presenting for IOL indicated by  oligohydramnios with AFI today 1.7 and 2.6, normal UA dopplers, BPP 6/10.  PN care at [facility] notable for A2/B GDM, not taking glyburide as advised; hx PP eclampsia with subararchnoid hemorrhage and intubation x 1wk after 2009 delivery  Maternal Medical History:  Reason for admission: Nausea. oligohydramnios  Contractions: Frequency: rare.   Perceived severity is mild.    Fetal activity: Perceived fetal activity is normal.   Last perceived fetal movement was within the past hour.    Prenatal complications: No bleeding, IUGR, pre-eclampsia, preterm labor or substance abuse.   Prenatal Complications - Diabetes: gestational. Diabetes is managed by oral agent (monotherapy).      OB History   Grav Para Term Preterm Abortions TAB SAB Ect Mult Living   8 6 6  1 1  0   6     Past Medical History  Diagnosis Date  . HSV-2 infection   . H/O abnormal Pap smear   . Abnormal Pap smear   . BV (bacterial vaginosis) 08/28/2012  . Unplanned wanted pregnancy 11/21/2012  . Diabetes mellitus without complication   . 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 Vaginal Pap smear, abnormal   . Gestational diabetes     glyburide   Past Surgical History  Procedure Laterality Date  . No past surgeries     Family History: family history includes COPD in her maternal grandmother. Social History:  reports that she quit smoking about 18 years ago. Her smoking use included Cigarettes. She smoked 0.00 packs per day. She has never used smokeless tobacco. She reports that she does not drink alcohol or use illicit drugs.   Prenatal Transfer Tool  Maternal Diabetes: Yes:  Diabetes Type:  Insulin/Medication controlled Genetic Screening: Declined Maternal Ultrasounds/Referrals: Normal Fetal Ultrasounds or other Referrals:  None Maternal Substance Abuse:  No Significant Maternal Medications:  Meds include:  Other:  Significant Maternal Lab Results:  Lab values include: Group B Strep positive Other Comments:  oligohydramnios   Review of Systems  Constitutional: Negative for fever.  Eyes: Negative for blurred vision and double vision.  Respiratory: Negative for cough.   Gastrointestinal: Negative for nausea, vomiting and diarrhea.  Genitourinary: Negative for dysuria.  Neurological: Negative for headaches.  Psychiatric/Behavioral: Negative for depression.  All other systems reviewed and are negative.  No genital HSV prodrome or lesions    Blood pressure 103/74, pulse 111, temperature 98.7 F (37.1 C), temperature source Oral, resp. rate 20, height 4\' 10"  (1.473 m), weight 59.421 kg (131 lb). Maternal Exam:  Uterine Assessment: Contraction strength is mild.  Contraction frequency is rare.   Abdomen: Estimated fetal weight is 6 1/2 -7#.    Introitus: Normal vulva. Normal vagina.  Ferning test: not done.  Nitrazine test: not done. Amniotic fluid character: not assessed. Slight bloody show  Pelvis: adequate for delivery.   Cervix: Cervix evaluated by digital exam.     Fetal Exam Fetal Monitor Review: Mode: ultrasound.   Variability: moderate (6-25 bpm).   Pattern: accelerations present and no decelerations.   Isolated variable 120's  Fetal State Assessment: Category I - tracings are normal.     Physical Exam  Constitutional: She is oriented to person, place, and time. She appears well-developed and well-nourished.  HENT:  Head: Normocephalic.  Eyes: Conjunctivae are normal. Pupils are equal, round, and reactive to light.  Neck: Normal  range of motion.  Cardiovascular: Normal rate, regular rhythm and normal heart sounds.   Respiratory: Effort normal and breath sounds normal.  GI: Soft. There is no tenderness.  Genitourinary: Vagina normal.  Musculoskeletal: Normal range of motion.  Neurological: She is alert and oriented to person, place, and time. She has normal  reflexes.  Skin: Skin is warm and dry.  Psychiatric: She has a normal mood and affect. Her behavior is normal. Thought content normal.    Prenatal labs: ABO, Rh: O/POS/-- (06/17 1035) Antibody: NEG (06/17 1035) Rubella: 1.51 (06/17 1035) RPR: NON REAC (01/12 1500)  HBsAg: NEGATIVE (06/17 1035)  HIV: NON REACTIVE (01/12 1500)  GBS: POSITIVE (01/19 1005)  2 hr OGTT (@ 7 wks) [phone]  Assessment/Plan: 33 yo G8P6016 at [redacted]w[redacted]d for IOL due to oligohydramnios A2/B DM> CBG q 4 h GBS carriage>IV PCN Hx eclampsia with P5 Favorable cx> pitociin  Category 1 FHR  Clayvon Parlett 07/09/2013, 3:39 PM

## 2013-07-09 NOTE — Progress Notes (Signed)
[name] is a 33 y.o. [phone] at 5340w6d admitted for induction of labor due to Low amniotic fluid..  Subjective: Pt doing well. Not uncomfortable at all.  Frustrated this is taking so long.  +FM  Objective: BP 119/76  Pulse 103  Temp(Src) 98.5 F (36.9 C) (Oral)  Resp 18  Ht 4\' 10"  (1.473 m)  Wt 59.421 kg (131 lb)  BMI 27.39 kg/m2      FHT:  FHR: 145 bpm, variability: moderate,  accelerations:  Present,  decelerations:  Absent UC:   irregular, every 2-4.5 minutes SVE:   Dilation: 3.5 Effacement (%): 70 Station: -2 Exam by:: Drenda FreezeFran CNM  Labs: Lab Results  Component Value Date   WBC 15.0* 07/09/2013   HGB 12.3 07/09/2013   HCT 35.7* 07/09/2013   MCV 82.4 07/09/2013   PLT 307 07/09/2013    Assessment / Plan: IOL due to oligo. still in early labor. Up to 3022mu/min of pit  Labor: cont increasting pit. once in a regular contraction pattern, will plan for AROM Fetal Wellbeing:  Category I Pain Control:  Labor support without medications I/D:  GB pos on PCN Anticipated MOD:  NSVD  [name] 07/09/2013, 11:06 PM

## 2013-07-10 ENCOUNTER — Encounter (HOSPITAL_COMMUNITY): Payer: Self-pay | Admitting: Anesthesiology

## 2013-07-10 ENCOUNTER — Encounter (HOSPITAL_COMMUNITY): Payer: Self-pay | Admitting: *Deleted

## 2013-07-10 DIAGNOSIS — O99354 Diseases of the nervous system complicating childbirth: Secondary | ICD-10-CM

## 2013-07-10 DIAGNOSIS — O4100X0 Oligohydramnios, unspecified trimester, not applicable or unspecified: Secondary | ICD-10-CM

## 2013-07-10 DIAGNOSIS — O24419 Gestational diabetes mellitus in pregnancy, unspecified control: Secondary | ICD-10-CM

## 2013-07-10 DIAGNOSIS — G40909 Epilepsy, unspecified, not intractable, without status epilepticus: Secondary | ICD-10-CM

## 2013-07-10 DIAGNOSIS — O99814 Abnormal glucose complicating childbirth: Secondary | ICD-10-CM

## 2013-07-10 LAB — GLUCOSE, CAPILLARY
GLUCOSE-CAPILLARY: 69 mg/dL — AB (ref 70–99)
GLUCOSE-CAPILLARY: 84 mg/dL (ref 70–99)

## 2013-07-10 MED ORDER — PHENYLEPHRINE 40 MCG/ML (10ML) SYRINGE FOR IV PUSH (FOR BLOOD PRESSURE SUPPORT): 80.0000 ug | PREFILLED_SYRINGE | INTRAVENOUS | Status: DC | PRN

## 2013-07-10 MED ORDER — PRENATAL MULTIVITAMIN CH
1.0000 | ORAL_TABLET | Freq: Every day | ORAL | Status: DC
  Filled 2013-07-10: qty 1

## 2013-07-10 MED ORDER — LANOLIN HYDROUS EX OINT: TOPICAL_OINTMENT | CUTANEOUS | Status: DC | PRN

## 2013-07-10 MED ORDER — PHENYLEPHRINE 40 MCG/ML (10ML) SYRINGE FOR IV PUSH (FOR BLOOD PRESSURE SUPPORT)
PREFILLED_SYRINGE | INTRAVENOUS | Status: AC
  Filled 2013-07-10: qty 10

## 2013-07-10 MED ORDER — BENZOCAINE-MENTHOL 20-0.5 % EX AERO
1.0000 "application " | INHALATION_SPRAY | CUTANEOUS | Status: DC | PRN
  Filled 2013-07-10: qty 56

## 2013-07-10 MED ORDER — EPHEDRINE 5 MG/ML INJ: 10.0000 mg | INTRAVENOUS | Status: DC | PRN

## 2013-07-10 MED ORDER — TETANUS-DIPHTH-ACELL PERTUSSIS 5-2.5-18.5 LF-MCG/0.5 IM SUSP
0.5000 mL | Freq: Once | INTRAMUSCULAR | Status: DC
  Filled 2013-07-10: qty 0.5

## 2013-07-10 MED ORDER — NALBUPHINE HCL 10 MG/ML IJ SOLN
10.0000 mg | INTRAMUSCULAR | Status: DC | PRN
  Administered 2013-07-10: 10 mg via INTRAVENOUS
  Filled 2013-07-10: qty 1

## 2013-07-10 MED ORDER — MISOPROSTOL 200 MCG PO TABS
800.0000 ug | ORAL_TABLET | Freq: Once | ORAL | Status: AC
  Administered 2013-07-10: 800 ug via RECTAL

## 2013-07-10 MED ORDER — ZOLPIDEM TARTRATE 5 MG PO TABS: 5.0000 mg | ORAL_TABLET | Freq: Every evening | ORAL | Status: DC | PRN

## 2013-07-10 MED ORDER — MISOPROSTOL 25 MCG QUARTER TABLET
25.0000 ug | ORAL_TABLET | Freq: Once | ORAL | Status: AC
  Administered 2013-07-10: 25 ug via VAGINAL
  Filled 2013-07-10: qty 0.25

## 2013-07-10 MED ORDER — DIPHENHYDRAMINE HCL 50 MG/ML IJ SOLN: 12.5000 mg | INTRAMUSCULAR | Status: DC | PRN

## 2013-07-10 MED ORDER — DIBUCAINE 1 % RE OINT
1.0000 "application " | TOPICAL_OINTMENT | RECTAL | Status: DC | PRN
  Filled 2013-07-10: qty 28

## 2013-07-10 MED ORDER — EPHEDRINE 5 MG/ML INJ
INTRAVENOUS | Status: AC
Start: 2013-07-10 — End: 2013-07-11
  Filled 2013-07-10: qty 4

## 2013-07-10 MED ORDER — FENTANYL CITRATE 0.05 MG/ML IJ SOLN
100.0000 ug | Freq: Once | INTRAMUSCULAR | Status: DC
  Filled 2013-07-10: qty 2

## 2013-07-10 MED ORDER — METHYLERGONOVINE MALEATE 0.2 MG/ML IJ SOLN
0.2000 mg | INTRAMUSCULAR | Status: DC | PRN
  Administered 2013-07-10: 0.2 mg via INTRAMUSCULAR

## 2013-07-10 MED ORDER — SIMETHICONE 80 MG PO CHEW: 80.0000 mg | CHEWABLE_TABLET | ORAL | Status: DC | PRN

## 2013-07-10 MED ORDER — OXYTOCIN 40 UNITS IN LACTATED RINGERS INFUSION - SIMPLE MED: 62.5000 mL/h | INTRAVENOUS | Status: DC | PRN

## 2013-07-10 MED ORDER — DIPHENHYDRAMINE HCL 25 MG PO CAPS
25.0000 mg | ORAL_CAPSULE | Freq: Four times a day (QID) | ORAL | Status: DC | PRN
Start: 2013-07-10 — End: 2013-07-12

## 2013-07-10 MED ORDER — IBUPROFEN 600 MG PO TABS
600.0000 mg | ORAL_TABLET | Freq: Four times a day (QID) | ORAL | Status: DC
  Filled 2013-07-10 (×2): qty 1

## 2013-07-10 MED ORDER — MISOPROSTOL 200 MCG PO TABS
800.0000 ug | ORAL_TABLET | Freq: Once | ORAL | Status: DC
  Filled 2013-07-10: qty 4

## 2013-07-10 MED ORDER — FENTANYL 2.5 MCG/ML BUPIVACAINE 1/10 % EPIDURAL INFUSION (WH - ANES)
INTRAMUSCULAR | Status: AC
  Filled 2013-07-10: qty 125

## 2013-07-10 MED ORDER — ONDANSETRON HCL 4 MG/2ML IJ SOLN: 4.0000 mg | INTRAMUSCULAR | Status: DC | PRN

## 2013-07-10 MED ORDER — LACTATED RINGERS IV SOLN: 500.0000 mL | Freq: Once | INTRAVENOUS | Status: DC

## 2013-07-10 MED ORDER — ONDANSETRON HCL 4 MG PO TABS: 4.0000 mg | ORAL_TABLET | ORAL | Status: DC | PRN

## 2013-07-10 MED ORDER — METHYLERGONOVINE MALEATE 0.2 MG PO TABS
0.2000 mg | ORAL_TABLET | ORAL | Status: DC | PRN
Start: 2013-07-10 — End: 2013-07-11

## 2013-07-10 MED ORDER — OXYCODONE-ACETAMINOPHEN 5-325 MG PO TABS: 1.0000 | ORAL_TABLET | ORAL | Status: DC | PRN

## 2013-07-10 MED ORDER — WITCH HAZEL-GLYCERIN EX PADS: 1.0000 "application " | MEDICATED_PAD | CUTANEOUS | Status: DC | PRN

## 2013-07-10 MED ORDER — MISOPROSTOL 25 MCG QUARTER TABLET
50.0000 ug | ORAL_TABLET | Freq: Once | ORAL | Status: AC
  Administered 2013-07-10: 50 ug via ORAL
  Filled 2013-07-10: qty 0.5

## 2013-07-10 MED ORDER — METHYLERGONOVINE MALEATE 0.2 MG/ML IJ SOLN
INTRAMUSCULAR | Status: AC
  Filled 2013-07-10: qty 1

## 2013-07-10 MED ORDER — SENNOSIDES-DOCUSATE SODIUM 8.6-50 MG PO TABS
2.0000 | ORAL_TABLET | ORAL | Status: DC
  Filled 2013-07-10: qty 2

## 2013-07-10 MED ORDER — FENTANYL 2.5 MCG/ML BUPIVACAINE 1/10 % EPIDURAL INFUSION (WH - ANES): 14.0000 mL/h | INTRAMUSCULAR | Status: DC | PRN

## 2013-07-10 NOTE — Progress Notes (Signed)
Patient ID: Bradley FerrisAmanda R Peterson, female   DOB: 1980/12/21, 33 y.o.   MRN: [id number] Called to assess bleeding. Little more than normal. Pt was up to BR at 1915. Voided QS.   VSS. Fundus firm w/ massage. U/1. Moderate lochia initially, but scant after massage. No clots expressed. Will have pt empty bladder and start methergine series w/ first dose IM. Will per uterine exploration if no improvement over next hour of sooner PRN for worsening Sx.   [place]ia Milarose Savich, [place]CNM 07/10/2013 9:08 PM

## 2013-07-10 NOTE — Progress Notes (Signed)
[name] is a 33 y.o. [phone] at 7828w0d here for IOL 2/2 A2GDM and oligo.   Subjective:  No complaints, feeling well. No pain at this time.   Objective: BP 122/76  Pulse 101  Temp(Src) 98.5 F (36.9 C) (Oral)  Resp 20  Ht 4\' 10"  (1.473 m)  Wt 59.421 kg (131 lb)  BMI 27.39 kg/m2      FHT:  FHR: 145 bpm, variability: moderate,  accelerations:  Present,  decelerations:  Present occasional variable.  UC:   none SVE:   Dilation: 3.5 Effacement (%): 80 Station: -2 Exam by:: [employer]ogan  Labs: Lab Results  Component Value Date   WBC 15.0* 07/09/2013   HGB 12.3 07/09/2013   HCT 35.7* 07/09/2013   MCV 82.4 07/09/2013   PLT 307 07/09/2013    Assessment / Plan: Induction of labor due to GDM and oligo. ,  progressing well on pitocin  Labor: has had cytotec x 2. Will start pitocin now.  Preeclampsia:  no signs or symptoms of toxicity Fetal Wellbeing:  Category I Pain Control:  Labor support without medications I/D:  n/a Anticipated MOD:  NSVD  Tawnya CrookHogan, Amazin Pincock Donovan 07/10/2013, 1:30 PM

## 2013-07-10 NOTE — Progress Notes (Addendum)
[name] is a 33 y.o. [id number] at 8528w0d admitted for IOL for oligo.  Subjective:  Pt doing well. Sleeping. +FM.   Objective: BP 117/78  Pulse 101  Temp(Src) 98.2 F (36.8 C) (Oral)  Resp 18  Ht 4\' 10"  (1.473 m)  Wt 59.421 kg (131 lb)  BMI 27.39 kg/m2      FHT:  FHR: 140 bpm, variability: moderate,  accelerations:  Present,  decelerations:  Absent UC:   irregular, every 1-6 minutes SVE:   Dilation: 3.5 Effacement (%): 50 Station: -2 Exam by:: Dr Reola CalkinsBeck  Labs: Lab Results  Component Value Date   WBC 15.0* 07/09/2013   HGB 12.3 07/09/2013   HCT 35.7* 07/09/2013   MCV 82.4 07/09/2013   PLT 307 07/09/2013    Assessment / Plan: IOL for oligohydramnios  Labor: on pitocin @ 1730mu/min. cont to increase no change but also no regular contraction pattern.   Fetal Wellbeing:  Category I Pain Control:  Labor support without medications I/D:  GBS pos. on PCN Anticipated MOD:  NSVD  Friend Dorfman L 07/10/2013, 1:39 AM

## 2013-07-10 NOTE — Anesthesia Preprocedure Evaluation (Deleted)
Anesthesia Evaluation  Patient identified by MRN, date of birth, ID band Patient awake    Reviewed: Allergy & Precautions, H&P , Patient's Chart, lab work & pertinent test results  Airway Mallampati: II TM Distance: >3 FB Neck ROM: full    Dental   Pulmonary former smoker,  breath sounds clear to auscultation        Cardiovascular Rhythm:regular Rate:Normal     Neuro/Psych Seizures -,     GI/Hepatic   Endo/Other  diabetes  Renal/GU      Musculoskeletal   Abdominal   Peds  Hematology   Anesthesia Other Findings   Reproductive/Obstetrics (+) Pregnancy                          Anesthesia Physical Anesthesia Plan  ASA: III  Anesthesia Plan: Epidural   Post-op Pain Management:    Induction:   Airway Management Planned:   Additional Equipment:   Intra-op Plan:   Post-operative Plan:   Informed Consent: I have reviewed the patients History and Physical, chart, labs and discussed the procedure including the risks, benefits and alternatives for the proposed anesthesia with the patient or authorized representative who has indicated his/her understanding and acceptance.     Plan Discussed with:   Anesthesia Plan Comments:         Anesthesia Quick Evaluation Came to room and mom pushing.  Decision not to proceed

## 2013-07-10 NOTE — Progress Notes (Signed)
[name] is a 33 y.o. [id number] at 823w0d IOL for oligo.  Subjective:   Objective: BP 120/84  Pulse 91  Temp(Src) 98.3 F (36.8 C) (Oral)  Resp 18  Ht 4\' 10"  (1.473 m)  Wt 59.421 kg (131 lb)  BMI 27.39 kg/m2      FHT:  FHR: 145 bpm, variability: moderate,  accelerations:  Present,  decelerations:  Absent UC:   irregular, every 2-7 minutes SVE:   Dilation: 3 Effacement (%): 70 Station: -1 Exam by:: [name] , CNM   Labs: Lab Results  Component Value Date   WBC 15.0* 07/09/2013   HGB 12.3 07/09/2013   HCT 35.7* 07/09/2013   MCV 82.4 07/09/2013   PLT 307 07/09/2013    Assessment / Plan: Induction of labor due to oligo,  progressing well on pitocin  Labor: still in latent labor. Will place one more dose of cytotec.  Preeclampsia:  NA Fetal Wellbeing:  Category I Pain Control:  Labor support without medications I/D:  n/a Anticipated MOD:  NSVD  Tawnya CrookHogan, Heather Donovan 07/10/2013, 9:25 AM

## 2013-07-10 NOTE — Progress Notes (Signed)
Pt refusing to be checked

## 2013-07-10 NOTE — Progress Notes (Signed)
Spoke with Thressa ShellerHeather Hogan CNM and orders received to continue Pitocin at 125cc/hr and orders for cytotec PR

## 2013-07-10 NOTE — Progress Notes (Signed)
Pt felt like she felt a "pop" a went to empty her bladder, very small amt of fluid noted while she emptied her bladder,

## 2013-07-10 NOTE — Progress Notes (Signed)
[name] is a 33 y.o. [id number] at 935w0d admitted for induction of labor due to oligo  Subjective:  Pt not feeling any pressure and very minimal pain with her contractions.  +FM.   Objective: BP 114/76  Pulse 92  Temp(Src) 98.6 F (37 C) (Oral)  Resp 18  Ht 4\' 10"  (1.473 m)  Wt 59.421 kg (131 lb)  BMI 27.39 kg/m2      FHT:  FHR: 140 bpm, variability: moderate,  accelerations:  Present,  decelerations:  Present variable- occasional UC:   irregular, every 3-9 minutes SVE:   Dilation: 3.5 Effacement (%): 50 Station: -2 Exam by:: S Nix RN  Labs: Lab Results  Component Value Date   WBC 15.0* 07/09/2013   HGB 12.3 07/09/2013   HCT 35.7* 07/09/2013   MCV 82.4 07/09/2013   PLT 307 07/09/2013    Assessment / Plan: IOL for oligo with no progression despite 2432mu/min of pit.  Cervix med consistency and still with some thickness.   Labor: d/c pitocin. and give OTO dose of cytotec 50mcg po and re-evaluate Fetal Wellbeing:  Category I Pain Control:  Labor support without medications I/D:  n/a Anticipated MOD:  NSVD  [name] L 07/10/2013, 4:18 AM

## 2013-07-11 LAB — CBC
HCT: 29 % — ABNORMAL LOW (ref 36.0–46.0)
HEMOGLOBIN: 10 g/dL — AB (ref 12.0–15.0)
MCH: 28.2 pg (ref 26.0–34.0)
MCHC: 34.5 g/dL (ref 30.0–36.0)
MCV: 81.7 fL (ref 78.0–100.0)
Platelets: 249 10*3/uL (ref 150–400)
RBC: 3.55 MIL/uL — ABNORMAL LOW (ref 3.87–5.11)
RDW: 13.7 % (ref 11.5–15.5)
WBC: 13 10*3/uL — ABNORMAL HIGH (ref 4.0–10.5)

## 2013-07-11 LAB — GLUCOSE, CAPILLARY: GLUCOSE-CAPILLARY: 133 mg/dL — AB (ref 70–99)

## 2013-07-11 MED ORDER — METHYLERGONOVINE MALEATE 0.2 MG PO TABS
0.2000 mg | ORAL_TABLET | ORAL | Status: AC
  Administered 2013-07-11 – 2013-07-12 (×5): 0.2 mg via ORAL
  Filled 2013-07-11 (×5): qty 1

## 2013-07-11 MED ORDER — ACETAMINOPHEN 325 MG PO TABS
650.0000 mg | ORAL_TABLET | Freq: Four times a day (QID) | ORAL | Status: DC | PRN
  Administered 2013-07-11 (×2): 650 mg via ORAL
  Filled 2013-07-11 (×5): qty 2

## 2013-07-11 NOTE — Progress Notes (Signed)
UR chart review completed.  

## 2013-07-11 NOTE — Progress Notes (Signed)
Jillian KinsmanVirginia Peterson, CNM phoned and notified of h/a and refusing motrin, requesting tylenol. IV HEP LOCKED AND TO START METHERGINE FOR 6 DOSES. . PT BLEEDING UNDER CONTROL AND NO ISSUES NOTED

## 2013-07-11 NOTE — Progress Notes (Signed)
Post Partum Day 1 Subjective: no complaints, up ad lib, voiding and tolerating PO  Objective: Blood pressure 108/70, pulse 90, temperature 98.1 F (36.7 C), temperature source Oral, resp. rate 20, height 4\' 10"  (1.473 m), weight 59.421 kg (131 lb), SpO2 97.00%, unknown if currently breastfeeding.  Physical Exam:  General: alert, cooperative and no distress Lochia: appropriate Uterine Fundus: firm Incision: na DVT Evaluation: No evidence of DVT seen on physical exam. No cords or calf tenderness. No significant calf/ankle edema.   Recent Labs  07/09/13 1520 07/11/13 0550  HGB 12.3 10.0*  HCT 35.7* 29.0*    Assessment/Plan: Plan for discharge tomorrow and Contraception depo followed by mirena   LOS: 2 days   [name], [name] 07/11/2013, 7:56 AM   I checked pt's pad moderate blood. No acute issues. Will monitor throughout day if needs additional doses of methergine.  I spoke with and examined patient and agree with resident's note and plan of care.  Tawana ScaleMichael Ryan Mathis Cashman, MD OB Fellow 07/11/2013 10:22 AM

## 2013-07-11 NOTE — Progress Notes (Signed)
Pt ambulating well, bleeding within normal limits, no extra methergine needed this pm. constant reassurance given to pt. As to bleeding normal and vs stable and no visible problems foreseen at present. PT c/o slight H/A . PT REFUSING MOTRIN AND REQUESTING   TYLENOL. Call to Nurse midwife placed.

## 2013-07-12 ENCOUNTER — Other Ambulatory Visit: Payer: Medicaid Other | Admitting: Advanced Practice Midwife

## 2013-07-12 MED ORDER — IBUPROFEN 600 MG PO TABS: 600.0000 mg | ORAL_TABLET | Freq: Four times a day (QID) | ORAL | Status: DC

## 2013-07-12 NOTE — Discharge Instructions (Signed)

## 2013-07-12 NOTE — Discharge Summary (Signed)
Obstetric Discharge Summary Reason for Admission: induction of labor for oligo  Prenatal Procedures: none Intrapartum Procedures: spontaneous vaginal delivery & GBS prophylaxis Postpartum Procedures: none Complications-Operative and Postpartum: hemorrhage- slightly more bldg than normal PP- started on Methergine series  Hemoglobin  Date Value Range Status  07/11/2013 10.0* 12.0 - 15.0 g/dL Final     REPEATED TO VERIFY     DELTA CHECK NOTED     HCT  Date Value Range Status  07/11/2013 29.0* 36.0 - 46.0 % Final   Pre-delivery hgb: 12.3  [name] is a 33 yo [id number] who was admitted to IOL due to oligohydramnios. Her cx was favorable and so pitocin was started and increased to 6632mu/min without labor, and so it was stopped and serial cytotec was given. Eventually she progressed to SVD on 2/3. A couple hours after delivery she had some extra bldg and a clot was expelled- nl lochia resumed. By PPD#2 she is deemed to have received the full benefit of her hospital stay and she will be discharged home.  Physical Exam:  General: alert, cooperative and no distress Lochia: appropriate Uterine Fundus: firm Incision: no significant drainage, no redness DVT Evaluation: No evidence of DVT seen on physical exam. No cords or calf tenderness. No significant calf/ankle edema.  Discharge Diagnoses: Term Pregnancy-delivered and oligohydramnios  Discharge Information: Date: 07/12/2013 Activity: pelvic rest Diet: routine Medications: PNV and Ibuprofen Condition: stable Instructions: refer to practice specific booklet Discharge to: home Contraception: depo followed by mirena Follow-up Information   Follow up with [facility] In 4 weeks. (You can go there sooner for depo if you need to)    Contact information:   [address] [place] [phone]      Newborn Data: Live born female  Birth Weight: 5 lb 12.6 oz (2625 g) APGAR: 9, 9  Home with mother.  [name],  [name] 07/12/2013, 7:52 AM  I have seen and examined this patient and I agree with the above. Cam HaiSHAW, Walker [place]addack 8:46 AM 07/12/2013

## 2013-07-17 ENCOUNTER — Inpatient Hospital Stay (HOSPITAL_COMMUNITY): Admission: RE | Admit: 2013-07-17 | Payer: Medicaid Other | Source: Ambulatory Visit

## 2013-07-25 ENCOUNTER — Telehealth: Payer: Self-pay | Admitting: Adult Health

## 2013-07-25 MED ORDER — MEDROXYPROGESTERONE ACETATE 150 MG/ML IM SUSP: 150.0000 mg | INTRAMUSCULAR | Status: DC

## 2013-07-26 ENCOUNTER — Ambulatory Visit: Payer: Medicaid Other

## 2013-07-27 ENCOUNTER — Encounter: Payer: Self-pay | Admitting: Obstetrics & Gynecology

## 2013-07-27 ENCOUNTER — Ambulatory Visit (INDEPENDENT_AMBULATORY_CARE_PROVIDER_SITE_OTHER): Payer: Medicaid Other | Admitting: Obstetrics & Gynecology

## 2013-07-27 VITALS — BP 134/80 | Ht 59.0 in | Wt 120.5 lb

## 2013-07-27 DIAGNOSIS — Z309 Encounter for contraceptive management, unspecified: Secondary | ICD-10-CM

## 2013-07-27 DIAGNOSIS — Z3202 Encounter for pregnancy test, result negative: Secondary | ICD-10-CM

## 2013-07-27 DIAGNOSIS — Z3049 Encounter for surveillance of other contraceptives: Secondary | ICD-10-CM

## 2013-07-27 LAB — POCT URINE PREGNANCY: Preg Test, Ur: NEGATIVE

## 2013-07-27 MED ORDER — MEDROXYPROGESTERONE ACETATE 150 MG/ML IM SUSP
150.0000 mg | Freq: Once | INTRAMUSCULAR | Status: AC
  Administered 2013-07-27: 150 mg via INTRAMUSCULAR

## 2013-07-27 NOTE — Progress Notes (Signed)
Pt here for Depo. Pt's BP was 134/80. Pt is without symptoms, no dizziness, blurred vision, or headache. Reviewed with Dr. Despina HiddenEure and he advised everything was ok at this point. Advised if she started with headache, dizziness, or blurred vision, let us know or call after hours nurse line. Pt voiced understanding. JSY

## 2013-08-20 ENCOUNTER — Ambulatory Visit: Payer: Medicaid Other | Admitting: Women's Health

## 2013-08-28 ENCOUNTER — Ambulatory Visit: Payer: Medicaid Other | Admitting: Women's Health

## 2013-08-28 ENCOUNTER — Encounter: Payer: Self-pay | Admitting: *Deleted

## 2013-09-17 ENCOUNTER — Ambulatory Visit: Payer: Medicaid Other | Admitting: Women's Health

## 2013-09-19 ENCOUNTER — Ambulatory Visit (INDEPENDENT_AMBULATORY_CARE_PROVIDER_SITE_OTHER): Payer: Medicaid Other | Admitting: Women's Health

## 2013-09-19 ENCOUNTER — Encounter: Payer: Self-pay | Admitting: Women's Health

## 2013-09-19 VITALS — BP 120/80 | Ht <= 58 in | Wt 116.5 lb

## 2013-09-19 DIAGNOSIS — K047 Periapical abscess without sinus: Secondary | ICD-10-CM

## 2013-09-19 DIAGNOSIS — O0990 Supervision of high risk pregnancy, unspecified, unspecified trimester: Secondary | ICD-10-CM

## 2013-09-19 MED ORDER — HYDROCODONE-ACETAMINOPHEN 5-325 MG PO TABS: 1.0000 | ORAL_TABLET | Freq: Four times a day (QID) | ORAL | Status: DC | PRN

## 2013-09-19 MED ORDER — AMOXICILLIN-POT CLAVULANATE 875-125 MG PO TABS: 1.0000 | ORAL_TABLET | Freq: Two times a day (BID) | ORAL | Status: DC

## 2013-09-19 NOTE — Progress Notes (Addendum)
Patient ID: Jillian FerrisAmanda R Peterson, female   DOB: 10/22/1980, 33 y.o.   MRN: [id number] Subjective:    Jillian Ferrismanda R Peterson is a 33 y.o. [id number] Caucasian female who presents for a postpartum visit. She is 10 weeks postpartum following a spontaneous vaginal delivery at 38.0 gestational weeks after IOL d/t oligohydramnios, she was also A2/B DM. Anesthesia: none. I have fully reviewed the prenatal and intrapartum course. Postpartum course has been uncomplicated. Baby's course has been colic. Baby is feeding by bottle. Bleeding no bleeding. Bowel function is normal. Bladder function is normal. Patient is sexually active. Contraception method is depo provera, due for next shot 10/19/13. Postpartum depression screening: negative. Score 1.  Last pap 10/19/11 and was neg w/ -HRHPV. Reports bilateral upper back tooth pain x 1wk, called dentist, can't get in w/ him until next month.  The following portions of the patient's history were reviewed and updated as appropriate: allergies, current medications, past medical history, past surgical history and problem list.  Review of Systems Pertinent items are noted in HPI.   Filed Vitals:   09/19/13 1125  BP: 120/80  Height: 4\' 10"  (1.473 m)  Weight: 116 lb 8 oz (52.844 kg)    Objective:   Dental: Lt upper back molar/?wisdom tooth w/ cavity- looks infected, Rt doesn't look bad General:  alert, cooperative and no distress   Breasts:  deferred, no complaints  Lungs: clear to auscultation bilaterally  Heart:  regular rate and rhythm  Abdomen: soft, nontender   Vulva: normal  Vagina: normal vagina  Cervix:  closed  Corpus: Well-involuted  Adnexa:  Non-palpable  Rectal Exam: No hemorrhoids        Assessment:   Postpartum exam 10 wks s/p SVD after IOL d/t oligohydramnios, also had A2/B DM Depression screening Contraception counseling  Dental abscess  Plan:  Rx augmentin bid x 7d, vicodin 5/325mg  1 q 6hr prn #15 0RF for dental abscess Keep appt as scheduled next  month w/ dentist Contraception: Depo-Provera injections Follow up in: 1 month for pap/physical, depo and 2hr gtt, or before as needed.   Marge DuncansKimberly Randall Antuane Eastridge CNM, [facility]NP-BC 09/19/2013 11:32 AM

## 2013-09-19 NOTE — Patient Instructions (Signed)
You will have your sugar test next visit.  Please do not eat or drink anything after midnight the night before you come, not even water.  You will be here for at least two hours.    

## 2013-09-25 ENCOUNTER — Emergency Department (HOSPITAL_COMMUNITY)
Admission: EM | Admit: 2013-09-25 | Discharge: 2013-09-25 | Disposition: A | Discharge status: Home / Self Care | Payer: Medicaid Other | Attending: Emergency Medicine | Admitting: Emergency Medicine

## 2013-09-25 ENCOUNTER — Encounter (HOSPITAL_COMMUNITY): Payer: Self-pay | Admitting: Emergency Medicine

## 2013-09-25 DIAGNOSIS — K029 Dental caries, unspecified: Secondary | ICD-10-CM | POA: Insufficient documentation

## 2013-09-25 DIAGNOSIS — K047 Periapical abscess without sinus: Secondary | ICD-10-CM | POA: Insufficient documentation

## 2013-09-25 DIAGNOSIS — Z8619 Personal history of other infectious and parasitic diseases: Secondary | ICD-10-CM | POA: Insufficient documentation

## 2013-09-25 DIAGNOSIS — E119 Type 2 diabetes mellitus without complications: Secondary | ICD-10-CM | POA: Insufficient documentation

## 2013-09-25 DIAGNOSIS — Z87891 Personal history of nicotine dependence: Secondary | ICD-10-CM | POA: Insufficient documentation

## 2013-09-25 MED ORDER — AMOXICILLIN 250 MG/5ML PO SUSR
500.0000 mg | Freq: Three times a day (TID) | ORAL | Status: DC
Start: 2013-09-25 — End: 2013-10-23

## 2013-09-25 NOTE — ED Provider Notes (Signed)
CSN: [id number]     Arrival date & time 09/25/13  1202 History   First MD Initiated Contact with Patient 09/25/13 1239     Chief Complaint  Patient presents with  . Dental Pain     (Consider location/radiation/quality/duration/timing/severity/associated sxs/prior Treatment) HPI Comments: Bradley Ferrismanda R Levings is a 33 y.o. Female presenting with  day history of dental pain and gingival swelling for the past week..   The patient has a history of  decay in her bilateral upper wisdom teeth with the left tooth causing pain and swelling.  There has been no fevers, chills, nausea or vomiting, also no complaint of difficulty swallowing, although chewing makes pain worse.  She was seen by her OB/GYN last week at which time she was placed on Augmentin and hydrocodone for this problem.  She is unable to swallow the Augmentin tablet and is requesting a liquid medication.  She is able to tolerate the hydrocodone.  She is not breast-feeding.     The history is provided by the patient.    Past Medical History  Diagnosis Date  . HSV-2 infection   . H/O abnormal Pap smear   . Abnormal Pap smear   . BV (bacterial vaginosis) 08/28/2012  . Unplanned wanted pregnancy 11/21/2012  . Diabetes mellitus without complication   . 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 Vaginal Pap smear, abnormal   . Gestational diabetes     glyburide   Past Surgical History  Procedure Laterality Date  . No past surgeries     Family History  Problem Relation Age of Onset  . COPD Maternal Grandmother    History  Substance Use Topics  . Smoking status: Former Smoker    Types: Cigarettes    Quit date: 06/08/1995  . Smokeless tobacco: Never Used  . Alcohol Use: No     Comment: occ   OB History   Grav Para Term Preterm Abortions TAB SAB Ect Mult Living   8 7 7  1 1  0   7     Review of Systems  Constitutional: Negative for fever.  HENT: Positive for dental problem. Negative for facial swelling  and sore throat.   Respiratory: Negative for shortness of breath.   Musculoskeletal: Negative for neck pain and neck stiffness.      Allergies  Review of patient's allergies indicates no known allergies.  Home Medications   Prior to Admission medications   Medication Sig Start Date End Date Taking? Authorizing Provider  medroxyPROGESTERone (DEPO-PROVERA) 150 MG/ML injection Inject 1 mL (150 mg total) into the muscle every 3 (three) months. 07/25/13  Yes Marge DuncansKimberly Randall Booker, CNM  amoxicillin (AMOXIL) 250 MG/5ML suspension Take 10 mLs (500 mg total) by mouth 3 (three) times daily. 09/25/13   Burgess AmorJulie Ely Ballen, PA-C   BP 122/86  Pulse 83  Temp(Src) 98.3 F (36.8 C) (Oral)  Resp 18  Ht 4\' 10"  (1.473 m)  Wt 115 lb (52.164 kg)  BMI 24.04 kg/m2  SpO2 100% Physical Exam  Constitutional: She is oriented to person, place, and time. She appears well-developed and well-nourished. No distress.  HENT:  Head: Normocephalic and atraumatic.  Right Ear: Tympanic membrane and external ear normal.  Left Ear: Tympanic membrane and external ear normal.  Nose: Nose normal.  Mouth/Throat: Oropharynx is clear and moist and mucous membranes are normal. No oral lesions. Dental abscesses and dental caries present.  Deep dental caries and decay of left upper  third molar with mild surrounding gingival edema without fluctuance.  Decayed noted in the right upper third molar as well but no signs suggesting infection here.  Eyes: Conjunctivae are normal.  Neck: Normal range of motion. Neck supple.  Cardiovascular: Normal rate and normal heart sounds.   Pulmonary/Chest: Effort normal.  Abdominal: She exhibits no distension.  Musculoskeletal: Normal range of motion.  Lymphadenopathy:    She has no cervical adenopathy.  Neurological: She is alert and oriented to person, place, and time.  Skin: Skin is warm and dry. No erythema.  Psychiatric: She has a normal mood and affect.    ED Course  Procedures  (including critical care time) Labs Review Labs Reviewed - No data to display  Imaging Review No results found.   EKG Interpretation None      MDM   Final diagnoses:  Dental abscess    Discussed breaking her Augmentin tablets in half which patient was reluctant to do.  She was prescribed liquid amoxicillin 500 mg 3 times a day for 10 days.  She can continue with her hydrocodone as she is currently taking.  Encouraged followup with her dentist.  The patient appears reasonably screened and/or stabilized for discharge and I doubt any other medical condition or other [facility] requiring further screening, evaluation, or treatment in the ED at this time prior to discharge.     Burgess AmorJulie Chalisa Kobler, PA-C 09/25/13 1417

## 2013-09-25 NOTE — ED Notes (Signed)
Pt c/o dental pain bilateral upper and lower wisdom teeth for over a week, unable to see dentist soon, has an appt in a month

## 2013-09-25 NOTE — Discharge Instructions (Signed)
°  Dental Abscess °A dental abscess is a collection of infected fluid (pus) from a bacterial infection in the inner part of the tooth (pulp). It usually occurs at the end of the tooth's root.  °CAUSES  °· Severe tooth decay. °· Trauma to the tooth that allows bacteria to enter into the pulp, such as a broken or chipped tooth. °SYMPTOMS  °· Severe pain in and around the infected tooth. °· Swelling and redness around the abscessed tooth or in the mouth or face. °· Tenderness. °· Pus drainage. °· Bad breath. °· Bitter taste in the mouth. °· Difficulty swallowing. °· Difficulty opening the mouth. °· Nausea. °· Vomiting. °· Chills. °· Swollen neck glands. °DIAGNOSIS  °· A medical and dental history will be taken. °· An examination will be performed by tapping on the abscessed tooth. °· X-rays may be taken of the tooth to identify the abscess. °TREATMENT °The goal of treatment is to eliminate the infection. You may be prescribed antibiotic medicine to stop the infection from spreading. A root canal may be performed to save the tooth. If the tooth cannot be saved, it may be pulled (extracted) and the abscess may be drained.  °HOME CARE INSTRUCTIONS °· Only take over-the-counter or prescription medicines for pain, fever, or discomfort as directed by your caregiver. °· Rinse your mouth (gargle) often with salt water (¼ tsp salt in 8 oz [250 ml] of warm water) to relieve pain or swelling. °· Do not drive after taking pain medicine (narcotics). °· Do not apply heat to the outside of your face. °· Return to your dentist for further treatment as directed. °SEEK MEDICAL CARE IF: °· Your pain is not helped by medicine. °· Your pain is getting worse instead of better. °SEEK IMMEDIATE MEDICAL CARE IF: °· You have a fever or persistent symptoms for more than 2 3 days. °· You have a fever and your symptoms suddenly get worse. °· You have chills or a very bad headache. °· You have problems breathing or swallowing. °· You have trouble  opening your mouth. °· You have swelling in the neck or around the eye. °Document Released: 05/24/2005 Document Revised: 02/16/2012 Document Reviewed: 09/01/2010 °ExitCare® Patient Information ©2014 [employer]. ° ° °

## 2013-09-25 NOTE — ED Notes (Signed)
Bil upper wisdom teeth are decayed. And painful for 1 week.

## 2013-09-26 NOTE — ED Provider Notes (Signed)
Medical screening examination/treatment/procedure(s) were performed by non-physician practitioner and as supervising physician I was immediately available for consultation/collaboration.   EKG Interpretation None        Laray AngerKathleen M Edwar Coe, DO 09/26/13 [id number]

## 2013-10-19 ENCOUNTER — Ambulatory Visit: Payer: Medicaid Other

## 2013-10-19 ENCOUNTER — Other Ambulatory Visit: Payer: Medicaid Other | Admitting: Adult Health

## 2013-10-19 ENCOUNTER — Other Ambulatory Visit: Payer: Medicaid Other

## 2013-10-22 ENCOUNTER — Other Ambulatory Visit: Payer: Medicaid Other

## 2013-10-22 ENCOUNTER — Other Ambulatory Visit: Payer: Medicaid Other | Admitting: Adult Health

## 2013-10-22 ENCOUNTER — Encounter: Payer: Self-pay | Admitting: *Deleted

## 2013-10-23 ENCOUNTER — Ambulatory Visit: Payer: Medicaid Other

## 2013-10-23 ENCOUNTER — Encounter: Payer: Self-pay | Admitting: Adult Health

## 2013-10-23 ENCOUNTER — Ambulatory Visit (INDEPENDENT_AMBULATORY_CARE_PROVIDER_SITE_OTHER): Payer: Medicaid Other | Admitting: Adult Health

## 2013-10-23 VITALS — BP 120/80 | Ht 59.0 in | Wt 117.0 lb

## 2013-10-23 DIAGNOSIS — Z3202 Encounter for pregnancy test, result negative: Secondary | ICD-10-CM

## 2013-10-23 DIAGNOSIS — Z3200 Encounter for pregnancy test, result unknown: Secondary | ICD-10-CM

## 2013-10-23 DIAGNOSIS — Z309 Encounter for contraceptive management, unspecified: Secondary | ICD-10-CM

## 2013-10-23 DIAGNOSIS — Z3049 Encounter for surveillance of other contraceptives: Secondary | ICD-10-CM

## 2013-10-23 LAB — POCT URINE PREGNANCY: PREG TEST UR: NEGATIVE

## 2013-10-23 MED ORDER — MEDROXYPROGESTERONE ACETATE 150 MG/ML IM SUSP
150.0000 mg | Freq: Once | INTRAMUSCULAR | Status: AC
  Administered 2013-10-23: 150 mg via INTRAMUSCULAR

## 2013-10-30 ENCOUNTER — Other Ambulatory Visit: Payer: Medicaid Other

## 2013-11-06 ENCOUNTER — Other Ambulatory Visit: Payer: Medicaid Other

## 2013-11-06 ENCOUNTER — Other Ambulatory Visit: Payer: Medicaid Other | Admitting: Women's Health

## 2013-11-21 ENCOUNTER — Other Ambulatory Visit: Payer: Medicaid Other | Admitting: Advanced Practice Midwife

## 2013-11-21 ENCOUNTER — Other Ambulatory Visit: Payer: Medicaid Other

## 2014-01-15 ENCOUNTER — Ambulatory Visit: Payer: Medicaid Other

## 2014-01-16 ENCOUNTER — Encounter: Payer: Self-pay | Admitting: Adult Health

## 2014-01-16 ENCOUNTER — Ambulatory Visit (INDEPENDENT_AMBULATORY_CARE_PROVIDER_SITE_OTHER): Payer: Medicaid Other | Admitting: Adult Health

## 2014-01-16 DIAGNOSIS — Z3042 Encounter for surveillance of injectable contraceptive: Secondary | ICD-10-CM

## 2014-01-16 DIAGNOSIS — Z3049 Encounter for surveillance of other contraceptives: Secondary | ICD-10-CM

## 2014-01-16 DIAGNOSIS — Z3202 Encounter for pregnancy test, result negative: Secondary | ICD-10-CM

## 2014-01-16 LAB — POCT URINE PREGNANCY: Preg Test, Ur: NEGATIVE

## 2014-01-16 MED ORDER — MEDROXYPROGESTERONE ACETATE 150 MG/ML IM SUSP
150.0000 mg | Freq: Once | INTRAMUSCULAR | Status: AC
  Administered 2014-01-16: 150 mg via INTRAMUSCULAR

## 2014-01-21 ENCOUNTER — Other Ambulatory Visit: Payer: Medicaid Other

## 2014-01-21 ENCOUNTER — Other Ambulatory Visit: Payer: Medicaid Other | Admitting: Women's Health

## 2014-04-08 ENCOUNTER — Encounter: Payer: Self-pay | Admitting: Adult Health

## 2014-04-10 ENCOUNTER — Ambulatory Visit: Payer: Medicaid Other

## 2014-04-16 ENCOUNTER — Other Ambulatory Visit: Payer: Medicaid Other | Admitting: Obstetrics & Gynecology

## 2014-04-17 ENCOUNTER — Other Ambulatory Visit: Payer: Medicaid Other | Admitting: Advanced Practice Midwife

## 2014-04-30 ENCOUNTER — Other Ambulatory Visit: Payer: Medicaid Other | Admitting: Advanced Practice Midwife

## 2014-05-14 ENCOUNTER — Other Ambulatory Visit: Payer: Medicaid Other | Admitting: Advanced Practice Midwife

## 2014-06-04 ENCOUNTER — Ambulatory Visit (INDEPENDENT_AMBULATORY_CARE_PROVIDER_SITE_OTHER): Payer: Medicaid Other | Admitting: Advanced Practice Midwife

## 2014-06-04 ENCOUNTER — Encounter: Payer: Self-pay | Admitting: Advanced Practice Midwife

## 2014-06-04 ENCOUNTER — Other Ambulatory Visit (HOSPITAL_COMMUNITY)
Admission: RE | Admit: 2014-06-04 | Discharge: 2014-06-04 | Disposition: A | Discharge status: Home / Self Care | Payer: Medicaid Other | Source: Ambulatory Visit | Attending: Advanced Practice Midwife | Admitting: Advanced Practice Midwife

## 2014-06-04 VITALS — BP 100/70 | Ht <= 58 in | Wt 119.0 lb

## 2014-06-04 DIAGNOSIS — Z1151 Encounter for screening for human papillomavirus (HPV): Secondary | ICD-10-CM | POA: Diagnosis present

## 2014-06-04 DIAGNOSIS — Z1329 Encounter for screening for other suspected endocrine disorder: Secondary | ICD-10-CM

## 2014-06-04 DIAGNOSIS — Z113 Encounter for screening for infections with a predominantly sexual mode of transmission: Secondary | ICD-10-CM | POA: Diagnosis present

## 2014-06-04 DIAGNOSIS — Z01419 Encounter for gynecological examination (general) (routine) without abnormal findings: Secondary | ICD-10-CM | POA: Diagnosis not present

## 2014-06-04 DIAGNOSIS — Z0000 Encounter for general adult medical examination without abnormal findings: Secondary | ICD-10-CM

## 2014-06-04 DIAGNOSIS — R87810 Cervical high risk human papillomavirus (HPV) DNA test positive: Secondary | ICD-10-CM | POA: Diagnosis present

## 2014-06-04 DIAGNOSIS — Z131 Encounter for screening for diabetes mellitus: Secondary | ICD-10-CM

## 2014-06-04 DIAGNOSIS — Z13220 Encounter for screening for lipoid disorders: Secondary | ICD-10-CM

## 2014-06-04 LAB — COMPREHENSIVE METABOLIC PANEL
ALBUMIN: 4.1 g/dL (ref 3.5–5.2)
ALT: 24 U/L (ref 0–35)
AST: 16 U/L (ref 0–37)
Alkaline Phosphatase: 91 U/L (ref 39–117)
BUN: 8 mg/dL (ref 6–23)
CALCIUM: 9.1 mg/dL (ref 8.4–10.5)
CHLORIDE: 108 meq/L (ref 96–112)
CO2: 23 mEq/L (ref 19–32)
CREATININE: 0.59 mg/dL (ref 0.50–1.10)
Glucose, Bld: 97 mg/dL (ref 70–99)
Potassium: 4.2 mEq/L (ref 3.5–5.3)
Sodium: 140 mEq/L (ref 135–145)
Total Bilirubin: 0.3 mg/dL (ref 0.2–1.2)
Total Protein: 6.6 g/dL (ref 6.0–8.3)

## 2014-06-04 LAB — LIPID PANEL
CHOL/HDL RATIO: 5.1 ratio
CHOLESTEROL: 139 mg/dL (ref 0–200)
HDL: 27 mg/dL — AB (ref >39)
LDL Cholesterol: 91 mg/dL (ref 0–99)
Triglycerides: 107 mg/dL (ref <150)
VLDL: 21 mg/dL (ref 0–40)

## 2014-06-04 NOTE — Progress Notes (Signed)
Jillian FerrisAmanda R Peterson 33 y.o.  Filed Vitals:   06/04/14 0856  BP: 100/70     Past Medical History: Past Medical History  Diagnosis Date  . HSV-2 infection   . H/O abnormal Pap smear   . Abnormal Pap smear   . BV (bacterial vaginosis) 08/28/2012  . Unplanned wanted pregnancy 11/21/2012  . Diabetes mellitus without complication   . 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 Vaginal Pap smear, abnormal   . Gestational diabetes     glyburide    Past Surgical History: Past Surgical History  Procedure Laterality Date  . No past surgeries      Family History: Family History  Problem Relation Age of Onset  . COPD Maternal Grandmother     Social History: History  Substance Use Topics  . Smoking status: Former Smoker    Types: Cigarettes    Quit date: 06/08/1995  . Smokeless tobacco: Never Used  . Alcohol Use: No    Allergies: No Known Allergies   No current outpatient prescriptions on file.  History of Present Illness: Here for pap and physical.  Stopped Depo in November and is using abstinence (gained a lot of weight)..  Was diagnosed class A2/B DM last year with pregnancy and was prescribed Glyburide 2.5mg  BID, but has never taken it.    Review of Systems   Patient denies any headaches, blurred vision, shortness of breath, chest pain, abdominal pain, problems with bowel movements, urination, or intercourse.   Physical Exam: General:  Well developed, well nourished, no acute distress Skin:  Warm and dry Neck:  Midline trachea, normal thyroid Lungs; Clear to auscultation bilaterally Breast:  No dominant palpable mass, retraction, or nipple discharge Cardiovascular: Regular rate and rhythm Abdomen:  Soft, non tender, no hepatosplenomegaly Pelvic:  External genitalia is normal in appearance.  The vagina is normal in appearance.  The cervix is bulbous.  Uterus is felt to be normal size, shape, and contour.  No adnexal masses or tenderness noted.   Extremities:  No swelling or varicosities noted Psych:  No mood changes.     Impression: Normal GYN exam     Plan: CBC, TSH, Hgb A1C, Lipid panel

## 2014-06-05 LAB — CBC
HCT: 42.4 % (ref 36.0–46.0)
Hemoglobin: 14 g/dL (ref 12.0–15.0)
MCH: 27 pg (ref 26.0–34.0)
MCHC: 33 g/dL (ref 30.0–36.0)
MCV: 81.9 fL (ref 78.0–100.0)
MPV: 8.8 fL (ref 8.6–12.4)
Platelets: 348 10*3/uL (ref 150–400)
RBC: 5.18 MIL/uL — AB (ref 3.87–5.11)
RDW: 13.2 % (ref 11.5–15.5)
WBC: 8.7 10*3/uL (ref 4.0–10.5)

## 2014-06-05 LAB — CYTOLOGY - PAP

## 2014-06-05 LAB — HEMOGLOBIN A1C
HEMOGLOBIN A1C: 5.8 % — AB (ref <5.7)
MEAN PLASMA GLUCOSE: 120 mg/dL — AB (ref <117)

## 2014-06-05 LAB — TSH: TSH: 0.74 u[IU]/mL (ref 0.350–4.500)

## 2014-06-10 ENCOUNTER — Telehealth: Payer: Self-pay | Admitting: Obstetrics and Gynecology

## 2014-06-10 NOTE — Telephone Encounter (Signed)
Pt informed of + HPV on Pap and A1C of 5.8 and mean plasma glucose of 120. Will mail pt information on HPV per request.

## 2014-06-18 ENCOUNTER — Telehealth: Payer: Self-pay | Admitting: Advanced Practice Midwife

## 2014-06-18 NOTE — Telephone Encounter (Signed)
[facility] but she states she was informed by me that it was +HPV. Informed pt that the HPV was positive and that was an additional test added to her pap, also discuss pt A1C results per Cathie BeamsFran Cresenzo-Dishmon, CNM pt to repeat both pap and A1C in 1 year. Pt verbalized understanding.

## 2014-06-23 ENCOUNTER — Emergency Department (HOSPITAL_COMMUNITY)
Admission: EM | Admit: 2014-06-23 | Discharge: 2014-06-23 | Disposition: A | Discharge status: Home / Self Care | Payer: Medicaid Other | Attending: Emergency Medicine | Admitting: Emergency Medicine

## 2014-06-23 ENCOUNTER — Encounter (HOSPITAL_COMMUNITY): Payer: Self-pay | Admitting: *Deleted

## 2014-06-23 DIAGNOSIS — Z87891 Personal history of nicotine dependence: Secondary | ICD-10-CM | POA: Diagnosis not present

## 2014-06-23 DIAGNOSIS — Z8632 Personal history of gestational diabetes: Secondary | ICD-10-CM | POA: Insufficient documentation

## 2014-06-23 DIAGNOSIS — Z8742 Personal history of other diseases of the female genital tract: Secondary | ICD-10-CM | POA: Diagnosis not present

## 2014-06-23 DIAGNOSIS — E119 Type 2 diabetes mellitus without complications: Secondary | ICD-10-CM | POA: Diagnosis not present

## 2014-06-23 DIAGNOSIS — K088 Other specified disorders of teeth and supporting structures: Secondary | ICD-10-CM | POA: Diagnosis present

## 2014-06-23 DIAGNOSIS — K029 Dental caries, unspecified: Secondary | ICD-10-CM | POA: Diagnosis not present

## 2014-06-23 DIAGNOSIS — Z8619 Personal history of other infectious and parasitic diseases: Secondary | ICD-10-CM | POA: Diagnosis not present

## 2014-06-23 MED ORDER — TRAMADOL HCL 50 MG PO TABS: 50.0000 mg | ORAL_TABLET | Freq: Four times a day (QID) | ORAL | Status: DC | PRN

## 2014-06-23 MED ORDER — CLINDAMYCIN HCL 150 MG PO CAPS: 150.0000 mg | ORAL_CAPSULE | Freq: Four times a day (QID) | ORAL | Status: DC

## 2014-06-23 MED ORDER — NAPROXEN 500 MG PO TABS: 500.0000 mg | ORAL_TABLET | Freq: Two times a day (BID) | ORAL | Status: DC

## 2014-06-23 NOTE — ED Notes (Signed)
Dental pain since Friday. Pt states she has an appt to have teeth removed on the 26th.

## 2014-06-23 NOTE — Discharge Instructions (Signed)
Dental Pain  A tooth ache may be caused by cavities (tooth decay). Cavities expose the nerve of the tooth to air and hot or cold temperatures. It may come from an infection or abscess (also called a boil or furuncle) around your tooth. It is also often caused by dental caries (tooth decay). This causes the pain you are having.  DIAGNOSIS   Your caregiver can diagnose this problem by exam.  TREATMENT   · If caused by an infection, it may be treated with medications which kill germs (antibiotics) and pain medications as prescribed by your caregiver. Take medications as directed.  · Only take over-the-counter or prescription medicines for pain, discomfort, or fever as directed by your caregiver.  · Whether the tooth ache today is caused by infection or dental disease, you should see your dentist as soon as possible for further care.  SEEK MEDICAL CARE IF:  The exam and treatment you received today has been provided on an emergency basis only. This is not a substitute for complete medical or dental care. If your problem worsens or new problems (symptoms) appear, and you are unable to meet with your dentist, call or return to this location.  SEEK IMMEDIATE MEDICAL CARE IF:   · You have a fever.  · You develop redness and swelling of your face, jaw, or neck.  · You are unable to open your mouth.  · You have severe pain uncontrolled by pain medicine.  MAKE SURE YOU:   · Understand these instructions.  · Will watch your condition.  · Will get help right away if you are not doing well or get worse.  Document Released: 05/24/2005 Document Revised: 08/16/2011 Document Reviewed: 01/10/2008  ExitCare® Patient Information ©2015 [employer]. This information is not intended to replace advice given to you by your health care provider. Make sure you discuss any questions you have with your health care provider.    Dental Caries  Dental caries is tooth decay. This decay can cause a hole in teeth (cavity) that can get bigger and  deeper over time.  HOME CARE  · Brush and floss your teeth. Do this at least two times a day.  · Use a fluoride toothpaste.  · Use a mouth rinse if told by your dentist or doctor.  · Eat less sugary and starchy foods. Drink less sugary drinks.  · Avoid snacking often on sugary and starchy foods. Avoid sipping often on sugary drinks.  · Keep regular checkups and cleanings with your dentist.  · Use fluoride supplements if told by your dentist or doctor.  · Allow fluoride to be applied to teeth if told by your dentist or doctor.  Document Released: 03/02/2008 Document Revised: 10/08/2013 Document Reviewed: 05/26/2012  ExitCare® Patient Information ©2015 [employer]. This information is not intended to replace advice given to you by your health care provider. Make sure you discuss any questions you have with your health care provider.

## 2014-06-23 NOTE — ED Provider Notes (Signed)
CSN: [id number]     Arrival date & time 06/23/14  1318 History  This chart was scribed for a non-physician practitioner, Junius Finner, PA-C working with Donnetta Hutching, MD by [place] Peace, ED Scribe. The patient was seen in [facility]. The patient's care was started at 2:07 PM.    Chief Complaint  Patient presents with  . Dental Pain      Patient is a 34 y.o. female presenting with tooth pain. The history is provided by the patient. No language interpreter was used.  Dental Pain Location:  Upper Severity:  Severe Onset quality:  Gradual Duration:  2 days Ineffective treatments: Tylenol. Associated symptoms: facial swelling   Associated symptoms: no fever and no headaches   HPI Comments: [name] is a 34 y.o. female who presents to the Emergency Department complaining of dental pain onset 2 days ago to left upper and lower spect of her mouth. Aching and throbbing, 10/10. No complaints of fever, nausea, or vomiting. Pt notes she has tried taking Tylenol without relief. Pt states she has appt to have affected tooth removed on the 26th by Dr. Jeanice Lim.    Past Medical History  Diagnosis Date  . HSV-2 infection   . H/O abnormal Pap smear   . Abnormal Pap smear   . BV (bacterial vaginosis) 08/28/2012  . Unplanned wanted pregnancy 11/21/2012  . Diabetes mellitus without complication   . 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 Vaginal Pap smear, abnormal   . Gestational diabetes     glyburide   Past Surgical History  Procedure Laterality Date  . No past surgeries     Family History  Problem Relation Age of Onset  . COPD Maternal Grandmother    History  Substance Use Topics  . Smoking status: Former Smoker    Types: Cigarettes    Quit date: 06/08/1995  . Smokeless tobacco: Never Used  . Alcohol Use: No   OB History    Gravida Para Term Preterm AB TAB SAB Ectopic Multiple Living   0   7     Review of Systems  Constitutional:  Negative for fever.  HENT: Positive for dental problem and facial swelling.   Gastrointestinal: Negative for nausea and vomiting.  Neurological: Negative for headaches.  All other systems reviewed and are negative.     Allergies  Review of patient's allergies indicates not on file.  Home Medications   Prior to Admission medications   Medication Sig Start Date End Date Taking? Authorizing Provider  acetaminophen (TYLENOL) 500 MG tablet Take 1,000 mg by mouth every 6 (six) hours as needed for moderate pain.   Yes Historical Provider, MD  clindamycin (CLEOCIN) 150 MG capsule Take 1 capsule (150 mg total) by mouth every 6 (six) hours. 06/23/14   Junius Finner, PA-C  naproxen (NAPROSYN) 500 MG tablet Take 1 tablet (500 mg total) by mouth 2 (two) times daily. 06/23/14   Junius Finner, PA-C  traMADol (ULTRAM) 50 MG tablet Take 1 tablet (50 mg total) by mouth every 6 (six) hours as needed. 06/23/14   Junius Finner, PA-C   BP 121/75 mmHg  Pulse 98  Temp(Src) 99.8 F (37.7 C) (Oral)  Resp 18  Ht  (1.473 m)  Wt 120 lb (54.432 kg)  BMI 25.09 kg/m2  SpO2 98% Physical Exam  Constitutional: She is oriented to person, place, and time. She appears well-developed and well-nourished.  HENT:  Head: Normocephalic and atraumatic.  Mouth/Throat: No dental abscesses.  Tenderness to last lower left molar. Surrounding edema to buccal mucosa. No gingival abscess. No bleeding or discharge.   Eyes: EOM are normal.  Neck: Normal range of motion.  Cardiovascular: Normal rate.   Pulmonary/Chest: Effort normal.  Musculoskeletal: Normal range of motion.  Neurological: She is alert and oriented to person, place, and time.  Skin: Skin is warm and dry.  Psychiatric: She has a normal mood and affect. Her behavior is normal.  Nursing note and vitals reviewed.   ED Course  Procedures (including critical care time) Labs Review Labs Reviewed - No data to display  Imaging Review No results found.    EKG Interpretation None     Medications - No data to display  2:10 PM- Treatment plan was discussed with patient who verbalizes understanding and agrees.   MDM   Final diagnoses:  Pain due to dental caries   Pt presenting to ED with dental pain w/o gingival abscess. Will tx with clindamycin, tramadol, and naproxen. Home care instructions provided. Advised to f/u with Dr. Jeanice Limurham as previously scheduled for 1/26. Return precautions provided. Pt verbalized understanding and agreement with tx plan.  I personally performed the services described in this documentation, which was scribed in my presence. The recorded information has been reviewed and is accurate.   Junius Finnerrin O'Malley, PA-C 06/23/14 1441  Donnetta HutchingBrian Cook, MD 06/25/14 1356

## 2014-09-23 ENCOUNTER — Telehealth: Payer: Self-pay | Admitting: Advanced Practice Midwife

## 2014-09-24 ENCOUNTER — Encounter: Payer: Self-pay | Admitting: *Deleted

## 2014-09-24 ENCOUNTER — Ambulatory Visit: Payer: Medicaid Other

## 2014-09-24 ENCOUNTER — Ambulatory Visit (INDEPENDENT_AMBULATORY_CARE_PROVIDER_SITE_OTHER): Payer: Medicaid Other | Admitting: *Deleted

## 2014-09-24 ENCOUNTER — Other Ambulatory Visit: Payer: Self-pay | Admitting: Women's Health

## 2014-09-24 DIAGNOSIS — Z3042 Encounter for surveillance of injectable contraceptive: Secondary | ICD-10-CM | POA: Diagnosis not present

## 2014-09-24 DIAGNOSIS — Z3202 Encounter for pregnancy test, result negative: Secondary | ICD-10-CM

## 2014-09-24 LAB — POCT URINE PREGNANCY: Preg Test, Ur: NEGATIVE

## 2014-09-24 MED ORDER — MEDROXYPROGESTERONE ACETATE 150 MG/ML IM SUSP
150.0000 mg | Freq: Once | INTRAMUSCULAR | Status: AC
  Administered 2014-09-24: 150 mg via INTRAMUSCULAR

## 2014-09-24 NOTE — Progress Notes (Signed)
Pt here for Depo. Pt started period on Saturday, April 16. Pt reports no problems at this time. Return in 12 weeks for next shot. JSY

## 2014-09-30 NOTE — Telephone Encounter (Signed)
Pt has already received her Depo injection

## 2014-12-17 ENCOUNTER — Ambulatory Visit: Payer: Medicaid Other

## 2015-04-17 ENCOUNTER — Ambulatory Visit (INDEPENDENT_AMBULATORY_CARE_PROVIDER_SITE_OTHER): Payer: Medicaid Other | Admitting: Advanced Practice Midwife

## 2015-04-17 ENCOUNTER — Encounter: Payer: Self-pay | Admitting: Advanced Practice Midwife

## 2015-04-17 VITALS — BP 100/60 | HR 80 | Wt 121.4 lb

## 2015-04-17 DIAGNOSIS — N898 Other specified noninflammatory disorders of vagina: Secondary | ICD-10-CM

## 2015-04-17 DIAGNOSIS — B373 Candidiasis of vulva and vagina: Secondary | ICD-10-CM | POA: Diagnosis not present

## 2015-04-17 DIAGNOSIS — B3731 Acute candidiasis of vulva and vagina: Secondary | ICD-10-CM

## 2015-04-17 MED ORDER — FLUCONAZOLE 150 MG PO TABS: ORAL_TABLET | ORAL | Status: DC

## 2015-04-17 MED ORDER — DOXYCYCLINE HYCLATE 100 MG PO CAPS: 100.0000 mg | ORAL_CAPSULE | Freq: Two times a day (BID) | ORAL | Status: DC

## 2015-04-17 NOTE — Progress Notes (Signed)
   [facility] Visit  Patient name: [name] Khalsa MRN [id number]  Date of birth: June 13, 1980  CC & HPI:  [name] Hlad is a 34 y.o. Caucasian female presenting today for c/o vaginal itching.  Her discharge is also heavier, yellos in color.  Sx for 2 days.   Pertinent History Reviewed:  Medical & Surgical Hx:   Past Medical History  Diagnosis Date  . HSV-2 infection   . H/O abnormal Pap smear   . Abnormal Pap smear   . BV (bacterial vaginosis) 08/28/2012  . Unplanned wanted pregnancy 11/21/2012  . Diabetes mellitus without complication (HCC)   . 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 Vaginal Pap smear, abnormal   . Gestational diabetes     glyburide   Past Surgical History  Procedure Laterality Date  . No past surgeries     Family History  Problem Relation Age of Onset  . COPD Maternal Grandmother     Current outpatient prescriptions:  .  doxycycline (VIBRAMYCIN) 100 MG capsule, Take 1 capsule (100 mg total) by mouth 2 (two) times daily., Disp: 14 capsule, Rfl: 0 .  fluconazole (DIFLUCAN) 150 MG tablet, 1 po stat; repeat in 3 days, Disp: 2 tablet, Rfl: 2 Social History: Reviewed -  reports that she quit smoking about 19 years ago. Her smoking use included Cigarettes. She has never used smokeless tobacco.  Review of Systems:   Constitutional: Negative for fever and chills Eyes: Negative for visual disturbances Respiratory: Negative for shortness of breath, dyspnea Cardiovascular: Negative for chest pain or palpitations  Gastrointestinal: Negative for vomiting, diarrhea and constipation; no abdominal pain Genitourinary: Negative for dysuria and urgency Musculoskeletal: Negative for back pain, joint pain, myalgias  Neurological: Negative for dizziness and headaches    Objective Findings:  Vitals: BP 100/60 mmHg  Pulse 80  Wt 121 lb 6.4 oz (55.067 kg)  Physical Examination: General appearance - well appearing, and in no  distress Mental status - alert, oriented to person, place, and time Chest:  Normal respiratory effort Heart - normal rate and regular rhythm Abdomen:  Soft, nontender Pelvic: Vulva sl red, SSE:  Thin yellowish, some clumps discharge with no odor.  Wet prep + yeast, no clue, large WBC, no trich. Musculoskeletal:  Normal range of motion without pain Extremities:  No edema    No results found for this or any previous visit (from the past 24 hour(s)).    Assessment & Plan:  A:   Leukorrhea, yeast P:  Doxycycline 100mg  BID X7, diflucan 150mg   GC/CHL  If sx don't improve, consider vaginal culture   Return after 12/29 for Pap.  CRESENZO-DISHMAN,Tenna Lacko CNM 04/17/2015 9:25 AM

## 2015-04-19 LAB — GC/CHLAMYDIA PROBE AMP
CHLAMYDIA, DNA PROBE: NEGATIVE
Neisseria gonorrhoeae by PCR: NEGATIVE

## 2015-04-21 ENCOUNTER — Telehealth: Payer: Self-pay | Admitting: *Deleted

## 2015-04-21 NOTE — Telephone Encounter (Signed)
No answer @ 11:04 am. JSY

## 2015-04-21 NOTE — Telephone Encounter (Signed)
No answer, just beeps @ 2:46 pm. JSY

## 2015-04-22 ENCOUNTER — Telehealth: Payer: Self-pay | Admitting: Advanced Practice Midwife

## 2015-04-22 NOTE — Telephone Encounter (Signed)
Mail box full @ 11:47 am. JSY

## 2015-04-22 NOTE — Telephone Encounter (Signed)
Pt informed GC/CHL from 04/17/2015 negative.

## 2015-04-22 NOTE — Telephone Encounter (Signed)
Voice mail not set up @ 4:59 pm. Multiple attempts to reach pt with no success. Encounter closed. JSY

## 2015-06-05 ENCOUNTER — Encounter: Payer: Self-pay | Admitting: *Deleted

## 2015-06-05 ENCOUNTER — Other Ambulatory Visit: Payer: Medicaid Other | Admitting: Advanced Practice Midwife

## 2015-06-25 ENCOUNTER — Encounter: Payer: Self-pay | Admitting: Advanced Practice Midwife

## 2015-06-25 ENCOUNTER — Other Ambulatory Visit (HOSPITAL_COMMUNITY)
Admission: RE | Admit: 2015-06-25 | Discharge: 2015-06-25 | Disposition: A | Discharge status: Home / Self Care | Payer: Medicaid Other | Source: Ambulatory Visit | Attending: Advanced Practice Midwife | Admitting: Advanced Practice Midwife

## 2015-06-25 ENCOUNTER — Ambulatory Visit (INDEPENDENT_AMBULATORY_CARE_PROVIDER_SITE_OTHER): Payer: Medicaid Other | Admitting: Advanced Practice Midwife

## 2015-06-25 VITALS — BP 102/72 | HR 64 | Ht 59.0 in | Wt 121.0 lb

## 2015-06-25 DIAGNOSIS — Z01419 Encounter for gynecological examination (general) (routine) without abnormal findings: Secondary | ICD-10-CM

## 2015-06-25 DIAGNOSIS — Z1151 Encounter for screening for human papillomavirus (HPV): Secondary | ICD-10-CM | POA: Insufficient documentation

## 2015-06-25 DIAGNOSIS — Z113 Encounter for screening for infections with a predominantly sexual mode of transmission: Secondary | ICD-10-CM | POA: Insufficient documentation

## 2015-06-25 DIAGNOSIS — Z3202 Encounter for pregnancy test, result negative: Secondary | ICD-10-CM

## 2015-06-25 DIAGNOSIS — Z13220 Encounter for screening for lipoid disorders: Secondary | ICD-10-CM

## 2015-06-25 DIAGNOSIS — Z0000 Encounter for general adult medical examination without abnormal findings: Secondary | ICD-10-CM

## 2015-06-25 LAB — POCT URINE PREGNANCY: Preg Test, Ur: NEGATIVE

## 2015-06-25 NOTE — Progress Notes (Signed)
[name] 35 y.o.  Filed Vitals:   06/25/15 1356  BP: 102/72  Pulse: 64     Past Medical History: Past Medical History  Diagnosis Date  . HSV-2 infection   . H/O abnormal Pap smear   . Abnormal Pap smear   . BV (bacterial vaginosis) 08/28/2012  . Unplanned wanted pregnancy 11/21/2012  . Diabetes mellitus without complication (HCC)   . 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 Vaginal Pap smear, abnormal   . Gestational diabetes     glyburide    Past Surgical History: Past Surgical History  Procedure Laterality Date  . No past surgeries      Family History: Family History  Problem Relation Age of Onset  . COPD Maternal Grandmother     Social History: Social History  Substance Use Topics  . Smoking status: Former Smoker    Types: Cigarettes    Quit date: 06/08/1995  . Smokeless tobacco: Never Used  . Alcohol Use: No    Allergies: No Known Allergies   No current outpatient prescriptions on file.  History of Present Illness: Here for pap/physical.  Last pap 12/15 was normal with + HPV. Is not currently on any Birth control.  Would like nothing for [facility] Is not having sex at the moment  Was dx early in pregnancy last year with DM.  Has not checked blood sugars since delivery.    Review of Systems   Patient denies any headaches, blurred vision, shortness of breath, chest pain, abdominal pain, problems with bowel movements, urination, or intercourse.   Physical Exam: General:  Well developed, well nourished, no acute distress Skin:  Warm and dry Neck:  Midline trachea, normal thyroid Lungs; Clear to auscultation bilaterally Breast:  No dominant palpable mass, retraction, or nipple discharge Cardiovascular: Regular rate and rhythm Abdomen:  Soft, non tender, no hepatosplenomegaly Pelvic:  External genitalia is normal in appearance.  The vagina is normal in appearance.  The cervix is bulbous.  Uterus is felt to be normal  size, shape, and contour.  No adnexal masses or tenderness noted.  Extremities:  No swelling or varicosities noted Psych:  No mood changes.     Impression: normal GYN exam     Plan: If pap normal with neg HPV, may check q 3 years. If still has HPV, will proceed to colpo.   CBC, CMP, HgB A1C, lipids when fasting.

## 2015-06-30 LAB — CYTOLOGY - PAP

## 2015-07-02 ENCOUNTER — Encounter: Payer: Self-pay | Admitting: Advanced Practice Midwife

## 2015-08-06 ENCOUNTER — Emergency Department (HOSPITAL_COMMUNITY)
Admission: EM | Admit: 2015-08-06 | Discharge: 2015-08-06 | Disposition: A | Discharge status: Home / Self Care | Payer: No Typology Code available for payment source | Attending: Emergency Medicine | Admitting: Emergency Medicine

## 2015-08-06 ENCOUNTER — Encounter (HOSPITAL_COMMUNITY): Payer: Self-pay | Admitting: Emergency Medicine

## 2015-08-06 DIAGNOSIS — Y998 Other external cause status: Secondary | ICD-10-CM | POA: Diagnosis not present

## 2015-08-06 DIAGNOSIS — Z8619 Personal history of other infectious and parasitic diseases: Secondary | ICD-10-CM | POA: Insufficient documentation

## 2015-08-06 DIAGNOSIS — Y9389 Activity, other specified: Secondary | ICD-10-CM | POA: Insufficient documentation

## 2015-08-06 DIAGNOSIS — Z8632 Personal history of gestational diabetes: Secondary | ICD-10-CM | POA: Diagnosis not present

## 2015-08-06 DIAGNOSIS — Z8742 Personal history of other diseases of the female genital tract: Secondary | ICD-10-CM | POA: Insufficient documentation

## 2015-08-06 DIAGNOSIS — Y92410 Unspecified street and highway as the place of occurrence of the external cause: Secondary | ICD-10-CM | POA: Insufficient documentation

## 2015-08-06 DIAGNOSIS — Z87891 Personal history of nicotine dependence: Secondary | ICD-10-CM | POA: Diagnosis not present

## 2015-08-06 DIAGNOSIS — E119 Type 2 diabetes mellitus without complications: Secondary | ICD-10-CM | POA: Insufficient documentation

## 2015-08-06 DIAGNOSIS — Z041 Encounter for examination and observation following transport accident: Secondary | ICD-10-CM | POA: Insufficient documentation
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
## 2015-08-29 ENCOUNTER — Other Ambulatory Visit: Payer: Self-pay | Admitting: Advanced Practice Midwife

## 2015-09-18 ENCOUNTER — Encounter: Payer: Self-pay | Admitting: Adult Health

## 2015-09-18 ENCOUNTER — Ambulatory Visit: Payer: Medicaid Other | Admitting: Adult Health

## 2015-11-05 ENCOUNTER — Ambulatory Visit (INDEPENDENT_AMBULATORY_CARE_PROVIDER_SITE_OTHER): Payer: Medicaid Other | Admitting: Advanced Practice Midwife

## 2015-11-05 ENCOUNTER — Encounter: Payer: Self-pay | Admitting: Advanced Practice Midwife

## 2015-11-05 VITALS — BP 108/62 | HR 74 | Ht 59.0 in | Wt 117.0 lb

## 2015-11-05 DIAGNOSIS — B9689 Other specified bacterial agents as the cause of diseases classified elsewhere: Secondary | ICD-10-CM

## 2015-11-05 DIAGNOSIS — N898 Other specified noninflammatory disorders of vagina: Secondary | ICD-10-CM | POA: Diagnosis not present

## 2015-11-05 DIAGNOSIS — N762 Acute vulvitis: Secondary | ICD-10-CM | POA: Diagnosis not present

## 2015-11-05 DIAGNOSIS — A499 Bacterial infection, unspecified: Secondary | ICD-10-CM | POA: Diagnosis not present

## 2015-11-05 DIAGNOSIS — N760 Acute vaginitis: Secondary | ICD-10-CM | POA: Diagnosis not present

## 2015-11-05 MED ORDER — METRONIDAZOLE 0.75 % VA GEL: 1.0000 | Freq: Every day | VAGINAL | Status: DC

## 2015-11-05 NOTE — Patient Instructions (Signed)

## 2015-11-05 NOTE — Progress Notes (Signed)
   [facility] Visit  Patient name: [name] MRN [id number]  Date of birth: 12-Apr-1981  CC & HPI:  [name] is a 35 y.o. Caucasian female presenting today for fishy vaginal odor for a few days.  Denies itching/irritation, but dc is heavier.   Pertinent History Reviewed:  Medical & Surgical Hx:   Past Medical History  Diagnosis Date  . HSV-2 infection   . H/O abnormal Pap smear   . Abnormal Pap smear   . BV (bacterial vaginosis) 08/28/2012  . Unplanned wanted pregnancy 11/21/2012  . 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 Vaginal Pap smear, abnormal   . Diabetes mellitus without complication (HCC)   . Gestational diabetes     glyburide   Past Surgical History  Procedure Laterality Date  . No past surgeries     Family History  Problem Relation Age of Onset  . COPD Maternal Grandmother     Current outpatient prescriptions:  .  acyclovir (ZOVIRAX) 400 MG tablet, TAKE 1 TABLET BY MOUTH THREE TIMES DAILY. (Patient not taking: Reported on 11/05/2015), Disp: 90 tablet, Rfl: 3 .  metroNIDAZOLE (METROGEL VAGINAL) 0.75 % vaginal gel, Place 1 Applicatorful vaginally at bedtime., Disp: 70 g, Rfl: 1 Social History: Reviewed -  reports that she quit smoking about 20 years ago. Her smoking use included Cigarettes. She has never used smokeless tobacco.  Review of Systems:   Constitutional: Negative for fever and chills Eyes: Negative for visual disturbances Respiratory: Negative for shortness of breath, dyspnea Cardiovascular: Negative for chest pain or palpitations  Gastrointestinal: Negative for vomiting, diarrhea and constipation; no abdominal pain Genitourinary: Negative for dysuria and urgency, vaginal irritation or itching Musculoskeletal: Negative for back pain, joint pain, myalgias  Neurological: Negative for dizziness and headaches    Objective Findings:    Physical Examination: General appearance - well appearing, and  in no distress Mental status - alert, oriented to person, place, and time Chest:  Normal respiratory effort Heart - normal rate and regular rhythm Abdomen:  Soft, nontender Pelvic: vulva normal.  SSE: thin white dc w/amine odor. Wet prep + clue, no yeast or WBC or trich Musculoskeletal:  Normal range of motion without pain Extremities:  No edema    No results found for this or any previous visit (from the past 24 hour(s)).    Assessment & Plan:  A:   BV P:  rx metrogel   Return if symptoms worsen or fail to improve.  CRESENZO-DISHMAN,Elisse Pennick CNM 11/05/2015 4:27 PM

## 2015-11-10 ENCOUNTER — Telehealth: Payer: Self-pay | Admitting: *Deleted

## 2015-11-10 NOTE — Telephone Encounter (Signed)
Pt states has been using Metrogel as prescribed, noticed clumps after use, is this normal. Pt informed probably  from using the Metrogel, continue to use. Pt verbalized understanding.

## 2015-11-12 ENCOUNTER — Telehealth: Payer: Self-pay | Admitting: *Deleted

## 2015-11-12 MED ORDER — FLUCONAZOLE 150 MG PO TABS: 150.0000 mg | ORAL_TABLET | Freq: Once | ORAL | Status: DC

## 2015-11-12 NOTE — Telephone Encounter (Signed)
Spoke with [name]. [name] is having vaginal itching. She was on Metrogel recently. [name] is requesting Diflucan. She saw Drenda FreezeFran recently. Please advise. Thanks!! JSY

## 2015-11-12 NOTE — Telephone Encounter (Signed)
Refilled diflucan 

## 2015-11-15 LAB — COMPREHENSIVE METABOLIC PANEL
A/G RATIO: 1.7 (ref 1.2–2.2)
ALT: 7 IU/L (ref 0–32)
AST: 15 IU/L (ref 0–40)
Albumin: 4.3 g/dL (ref 3.5–5.5)
Alkaline Phosphatase: 89 IU/L (ref 39–117)
BILIRUBIN TOTAL: 0.5 mg/dL (ref 0.0–1.2)
BUN/Creatinine Ratio: 14 (ref 9–23)
BUN: 8 mg/dL (ref 6–20)
CALCIUM: 9.2 mg/dL (ref 8.7–10.2)
CHLORIDE: 102 mmol/L (ref 96–106)
CO2: 22 mmol/L (ref 18–29)
Creatinine, Ser: 0.58 mg/dL (ref 0.57–1.00)
GFR calc Af Amer: 139 mL/min/{1.73_m2} (ref >59)
GFR calc non Af Amer: 121 mL/min/{1.73_m2} (ref >59)
GLUCOSE: 91 mg/dL (ref 65–99)
Globulin, Total: 2.6 g/dL (ref 1.5–4.5)
POTASSIUM: 4.4 mmol/L (ref 3.5–5.2)
Sodium: 140 mmol/L (ref 134–144)
TOTAL PROTEIN: 6.9 g/dL (ref 6.0–8.5)

## 2015-11-15 LAB — LIPID PANEL
CHOLESTEROL TOTAL: 153 mg/dL (ref 100–199)
Chol/HDL Ratio: 4.8 ratio units — ABNORMAL HIGH (ref 0.0–4.4)
HDL: 32 mg/dL — AB (ref >39)
LDL Calculated: 108 mg/dL — ABNORMAL HIGH (ref 0–99)
TRIGLYCERIDES: 65 mg/dL (ref 0–149)
VLDL Cholesterol Cal: 13 mg/dL (ref 5–40)

## 2015-11-15 LAB — CBC
HEMATOCRIT: 41.6 % (ref 34.0–46.6)
HEMOGLOBIN: 14.5 g/dL (ref 11.1–15.9)
MCH: 28.4 pg (ref 26.6–33.0)
MCHC: 34.9 g/dL (ref 31.5–35.7)
MCV: 82 fL (ref 79–97)
PLATELETS: 319 10*3/uL (ref 150–379)
RBC: 5.1 x10E6/uL (ref 3.77–5.28)
RDW: 13.2 % (ref 12.3–15.4)
WBC: 9.1 10*3/uL (ref 3.4–10.8)

## 2015-11-15 LAB — HEMOGLOBIN A1C
Est. average glucose Bld gHb Est-mCnc: 103 mg/dL
HEMOGLOBIN A1C: 5.2 % (ref 4.8–5.6)

## 2015-11-15 LAB — TSH: TSH: 0.599 u[IU]/mL (ref 0.450–4.500)

## 2015-11-26 ENCOUNTER — Encounter: Payer: Self-pay | Admitting: Advanced Practice Midwife

## 2015-11-26 ENCOUNTER — Telehealth: Payer: Self-pay | Admitting: Advanced Practice Midwife

## 2015-11-26 NOTE — Telephone Encounter (Signed)
Pt informed of Lab results from 11/14/2015. Pt states she saw where her LDL was high and her HDL was low, should she be concerned?

## 2015-11-26 NOTE — Telephone Encounter (Signed)
Phone # not good--mychargt message sent

## 2016-01-21 ENCOUNTER — Telehealth: Payer: Self-pay | Admitting: *Deleted

## 2016-01-21 ENCOUNTER — Other Ambulatory Visit: Payer: Self-pay | Admitting: Advanced Practice Midwife

## 2016-01-21 MED ORDER — VALACYCLOVIR HCL 1 G PO TABS: 500.0000 mg | ORAL_TABLET | Freq: Two times a day (BID) | ORAL | 2 refills | Status: AC

## 2016-01-21 MED ORDER — VALACYCLOVIR HCL 1 G PO TABS: 1000.0000 mg | ORAL_TABLET | Freq: Every day | ORAL | 2 refills | Status: DC

## 2016-01-21 NOTE — Telephone Encounter (Signed)
Requesting Rx for herpes outbreak.

## 2016-01-21 NOTE — Progress Notes (Unsigned)
Valtrex 500mg  q 12 hr X 3 days for recurrant outbreak

## 2016-02-10 NOTE — Telephone Encounter (Signed)
RX was sent to pharmacy on 01/22/16

## 2016-04-08 ENCOUNTER — Ambulatory Visit (INDEPENDENT_AMBULATORY_CARE_PROVIDER_SITE_OTHER): Payer: Medicaid Other | Admitting: Women's Health

## 2016-04-08 ENCOUNTER — Encounter: Payer: Self-pay | Admitting: Women's Health

## 2016-04-08 VITALS — BP 88/60 | Wt 114.0 lb

## 2016-04-08 DIAGNOSIS — N6310 Unspecified lump in the right breast, unspecified quadrant: Secondary | ICD-10-CM | POA: Insufficient documentation

## 2016-04-08 DIAGNOSIS — Z8632 Personal history of gestational diabetes: Secondary | ICD-10-CM | POA: Insufficient documentation

## 2016-04-08 DIAGNOSIS — N6311 Unspecified lump in the right breast, upper outer quadrant: Secondary | ICD-10-CM | POA: Diagnosis not present

## 2016-04-08 DIAGNOSIS — N6312 Unspecified lump in the right breast, upper inner quadrant: Secondary | ICD-10-CM | POA: Insufficient documentation

## 2016-04-08 NOTE — Patient Instructions (Signed)
[facility], be there at 7:45am, no lotion/deoderant/powder/perfume that day

## 2016-04-08 NOTE — Progress Notes (Signed)
   [facility] Visit  Patient name: [name] MRN [id number]  Date of birth: 1980-09-05  CC & HPI:  [name] is a 35 y.o. [phone] Caucasian female presenting today for report of mass Rt breast x 2d. Feels breasts on regular basis, this definitely feels like a new finding to her. No pain, discharge. No family h/o breast cancer.  Patient's last menstrual period was 03/21/2016. The current method of family planning is none, is sexually active just of recently Last pap 06/25/2015, normal  Pertinent History Reviewed:  Medical & Surgical Hx:   Past medical, surgical, family, and social history reviewed in electronic medical record Medications: Reviewed & Updated - see associated section Allergies: Reviewed in electronic medical record  Objective Findings:  Vitals: BP (!) 88/60 (BP Location: Right Arm, Patient Position: Sitting, Cuff Size: Normal)   Wt 114 lb (51.7 kg)   LMP 03/21/2016   BMI 23.03 kg/m  Body mass index is 23.03 kg/m.  Physical Examination: General appearance - alert, well appearing, and in no distress Breasts - Rt: distinct ~1cm firm mass [id number] from nipple @ 1 o'clock, no abnormal skin changes, no axillary nodes palpated  Lt: normal breast w/o masses/nodes/skin changes  No results found for this or any previous visit (from the past 24 hour(s)).   Assessment & Plan:  A:   Rt breast mass  P:  Breast u/s and mammo 11/7 @ [facility] at 8:00, be there at 7:45, no lotion/deoderant/powder/perfume that day   Return for after 1/18 for pap & physical.  Marge DuncansBooker, Hrishikesh Hoeg Randall CNM, [facility] Institute[facility]NP-BC 04/08/2016 3:04 PM

## 2016-04-13 ENCOUNTER — Ambulatory Visit (HOSPITAL_COMMUNITY)
Admission: RE | Admit: 2016-04-13 | Discharge: 2016-04-13 | Disposition: A | Discharge status: Home / Self Care | Payer: Medicaid Other | Source: Ambulatory Visit | Attending: Women's Health | Admitting: Women's Health

## 2016-04-13 DIAGNOSIS — N6312 Unspecified lump in the right breast, upper inner quadrant: Secondary | ICD-10-CM | POA: Diagnosis not present

## 2016-04-13 DIAGNOSIS — N6310 Unspecified lump in the right breast, unspecified quadrant: Secondary | ICD-10-CM

## 2016-04-23 ENCOUNTER — Ambulatory Visit: Payer: Medicaid Other | Admitting: Adult Health

## 2016-04-25 ENCOUNTER — Encounter (HOSPITAL_COMMUNITY): Payer: Self-pay | Admitting: Emergency Medicine

## 2016-04-25 ENCOUNTER — Emergency Department (HOSPITAL_COMMUNITY)
Admission: EM | Admit: 2016-04-25 | Discharge: 2016-04-25 | Disposition: A | Discharge status: Home / Self Care | Payer: Medicaid Other | Attending: Emergency Medicine | Admitting: Emergency Medicine

## 2016-04-25 DIAGNOSIS — N9489 Other specified conditions associated with female genital organs and menstrual cycle: Secondary | ICD-10-CM | POA: Insufficient documentation

## 2016-04-25 DIAGNOSIS — N939 Abnormal uterine and vaginal bleeding, unspecified: Secondary | ICD-10-CM | POA: Diagnosis present

## 2016-04-25 DIAGNOSIS — E119 Type 2 diabetes mellitus without complications: Secondary | ICD-10-CM | POA: Insufficient documentation

## 2016-04-25 DIAGNOSIS — N946 Dysmenorrhea, unspecified: Secondary | ICD-10-CM | POA: Diagnosis not present

## 2016-04-25 DIAGNOSIS — Z87891 Personal history of nicotine dependence: Secondary | ICD-10-CM | POA: Diagnosis not present

## 2016-04-25 LAB — HCG, SERUM, QUALITATIVE: PREG SERUM: NEGATIVE

## 2016-04-25 LAB — POC URINE PREG, ED: Preg Test, Ur: NEGATIVE

## 2016-04-25 MED ORDER — IBUPROFEN 800 MG PO TABS: 800.0000 mg | ORAL_TABLET | Freq: Three times a day (TID) | ORAL | 0 refills | Status: DC

## 2016-04-25 NOTE — ED Triage Notes (Signed)
Pt reports vaginal bleeding and cramping x2 days. Pt reports last period oct 15th. Pt reports was a week late and took a pregnancy test. Pt reports x4 positive tests. Pt reports scheduled an appointment with OB but reports vaginal bleeding began before could see OB-GYN.

## 2016-04-28 NOTE — ED Provider Notes (Signed)
[facility] Provider Note   CSN: [id number] Arrival date & time: 04/25/16  0945     History   Chief Complaint Chief Complaint  Patient presents with  . Vaginal Bleeding    HPI [name] is a 35 y.o. female presenting for evaluation of vaginal bleeding.  She is concerned about possible pregnancy as she endorses being a week late with her menses (LMP 10/15) and took 4 home pregnancy tests, all faintly positive.  She also endorses slight nausea when she smells certain foods and some mild breast tenderness.  She denies abdominal pain, does endorse some menstrual cramping which she does not generally have.  Her flow has been normal without clots and she denies vaginal discharge or other complaint.  The history is provided by the patient.    Past Medical History:  Diagnosis Date  . Abnormal Pap smear   . BV (bacterial vaginosis) 08/28/2012  . Diabetes mellitus without complication (HCC)   . Gestational diabetes    glyburide  . H/O abnormal Pap smear   . HSV-2 infection   . Seizures (HCC)    Had a bleed in her brain, after childbirth in 2009  . Unplanned wanted pregnancy 11/21/2012  . Vaginal Pap smear, abnormal     Patient Active Problem List   Diagnosis Date Noted  . History of gestational diabetes 04/08/2016  . Breast mass, right 04/08/2016  . H/O PRES (posterior reversible encephalopathy syndrome) 2009 06/18/2013  . 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 NO PAST SURGERIES      OB History    Gravida Para Term Preterm AB Living   8 7 7   1 7    SAB TAB Ectopic Multiple Live Births   0 1     7       Home Medications    Prior to Admission medications   Medication Sig Start Date End Date Taking? Authorizing Provider  ibuprofen (ADVIL,MOTRIN) 800 MG tablet Take 1 tablet (800 mg total) by mouth 3 (three) times daily. 04/25/16   Burgess AmorJulie Ayanni Tun, PA-C    Family History Family History  Problem Relation Age of  Onset  . COPD Maternal Grandmother     Social History Social History  Substance Use Topics  . Smoking status: Former Smoker    Types: Cigarettes    Quit date: 06/08/1995  . Smokeless tobacco: Never Used  . Alcohol use No     Allergies   Patient has no known allergies.   Review of Systems Review of Systems  Constitutional: Negative for chills and fever.  HENT: Negative for congestion and sore throat.   Eyes: Negative.   Respiratory: Negative for chest tightness and shortness of breath.   Cardiovascular: Negative for chest pain.  Gastrointestinal: Positive for nausea. Negative for abdominal pain and vomiting.  Genitourinary: Positive for dysuria, menstrual problem and vaginal bleeding. Negative for vaginal discharge.  Musculoskeletal: Negative for arthralgias, joint swelling and neck pain.  Skin: Negative.  Negative for rash and wound.  Neurological: Negative for dizziness, weakness, light-headedness, numbness and headaches.  Psychiatric/Behavioral: Negative.      Physical Exam Updated Vital Signs BP 103/74 (BP Location: Right Arm)   Pulse 74   Temp 98.1 F (36.7 C) (Oral)   Resp 18   Ht 4\' 11"  (1.499 m)   Wt 51.7 kg   LMP 03/21/2016   SpO2 99%   BMI 23.03 kg/m   Physical Exam  Constitutional: She appears  well-developed and well-nourished.  HENT:  Head: Normocephalic and atraumatic.  Eyes: Conjunctivae are normal.  Neck: Normal range of motion.  Cardiovascular: Normal rate, regular rhythm, normal heart sounds and intact distal pulses.   Pulmonary/Chest: Effort normal and breath sounds normal. She has no wheezes.  Abdominal: Soft. Bowel sounds are normal. There is no tenderness.  Genitourinary:  Genitourinary Comments: deferred  Musculoskeletal: Normal range of motion.  Neurological: She is alert.  Skin: Skin is warm and dry.  Psychiatric: She has a normal mood and affect.  Nursing note and vitals reviewed.    ED Treatments / Results  Labs  Results  for orders placed or performed during the hospital encounter of 04/25/16  hCG, serum, qualitative  Result Value Ref Range   Preg, Serum NEGATIVE NEGATIVE  POC urine preg, ED  Result Value Ref Range   Preg Test, Ur NEGATIVE NEGATIVE      EKG  EKG Interpretation None       Radiology No results found.  Procedures Procedures (including critical care time)  Medications Ordered in ED Medications - No data to display   Initial Impression / Assessment and Plan / ED Course  I have reviewed the triage vital signs and the nursing notes.  Pertinent labs & imaging results that were available during my care of the patient were reviewed by me and considered in my medical decision making (see chart for details).  Clinical Course     Prn f/u with pcp and/or gyn as needed. Advised ibuprofen for cramping.  Final Clinical Impressions(s) / ED Diagnoses   Final diagnoses:  Dysmenorrhea    New Prescriptions Discharge Medication List as of 04/25/2016 11:46 AM    START taking these medications   Details  ibuprofen (ADVIL,MOTRIN) 800 MG tablet Take 1 tablet (800 mg total) by mouth 3 (three) times daily., Starting Sun 04/25/2016, Print         Burgess AmorJulie Fionn Stracke, PA-C 04/28/16 0818    Donnetta HutchingBrian Cook, MD 04/28/16 [phone]

## 2016-06-09 ENCOUNTER — Encounter (HOSPITAL_COMMUNITY): Payer: Self-pay | Admitting: Emergency Medicine

## 2016-06-09 ENCOUNTER — Emergency Department (HOSPITAL_COMMUNITY)
Admission: EM | Admit: 2016-06-09 | Discharge: 2016-06-09 | Disposition: A | Discharge status: Home / Self Care | Payer: Medicaid Other | Attending: Emergency Medicine | Admitting: Emergency Medicine

## 2016-06-09 DIAGNOSIS — E876 Hypokalemia: Secondary | ICD-10-CM | POA: Diagnosis not present

## 2016-06-09 DIAGNOSIS — R112 Nausea with vomiting, unspecified: Secondary | ICD-10-CM

## 2016-06-09 DIAGNOSIS — R197 Diarrhea, unspecified: Secondary | ICD-10-CM

## 2016-06-09 LAB — CBC
HCT: 44.1 % (ref 36.0–46.0)
Hemoglobin: 14.8 g/dL (ref 12.0–15.0)
MCH: 28 pg (ref 26.0–34.0)
MCHC: 33.6 g/dL (ref 30.0–36.0)
MCV: 83.4 fL (ref 78.0–100.0)
PLATELETS: 302 10*3/uL (ref 150–400)
RBC: 5.29 MIL/uL — ABNORMAL HIGH (ref 3.87–5.11)
RDW: 12.3 % (ref 11.5–15.5)
WBC: 7.8 10*3/uL (ref 4.0–10.5)

## 2016-06-09 LAB — COMPREHENSIVE METABOLIC PANEL
ALT: 14 U/L (ref 14–54)
AST: 20 U/L (ref 15–41)
Albumin: 4.3 g/dL (ref 3.5–5.0)
Alkaline Phosphatase: 88 U/L (ref 38–126)
Anion gap: 8 (ref 5–15)
BILIRUBIN TOTAL: 0.6 mg/dL (ref 0.3–1.2)
BUN: 9 mg/dL (ref 6–20)
CO2: 25 mmol/L (ref 22–32)
CREATININE: 0.67 mg/dL (ref 0.44–1.00)
Calcium: 9 mg/dL (ref 8.9–10.3)
Chloride: 100 mmol/L — ABNORMAL LOW (ref 101–111)
GFR calc Af Amer: >60 mL/min (ref >60)
Glucose, Bld: 95 mg/dL (ref 65–99)
Potassium: 3.1 mmol/L — ABNORMAL LOW (ref 3.5–5.1)
Sodium: 133 mmol/L — ABNORMAL LOW (ref 135–145)
TOTAL PROTEIN: 8 g/dL (ref 6.5–8.1)

## 2016-06-09 LAB — URINALYSIS, ROUTINE W REFLEX MICROSCOPIC
Bacteria, UA: NONE SEEN
Bilirubin Urine: NEGATIVE
GLUCOSE, UA: NEGATIVE mg/dL
KETONES UR: NEGATIVE mg/dL
LEUKOCYTES UA: NEGATIVE
NITRITE: NEGATIVE
PH: 5 (ref 5.0–8.0)
Protein, ur: NEGATIVE mg/dL
SPECIFIC GRAVITY, URINE: 1.024 (ref 1.005–1.030)

## 2016-06-09 LAB — PREGNANCY, URINE: Preg Test, Ur: NEGATIVE

## 2016-06-09 LAB — LIPASE, BLOOD: Lipase: 28 U/L (ref 11–51)

## 2016-06-09 MED ORDER — SODIUM CHLORIDE 0.9 % IV BOLUS (SEPSIS)
2000.0000 mL | Freq: Once | INTRAVENOUS | Status: AC
  Administered 2016-06-09: 2000 mL via INTRAVENOUS

## 2016-06-09 MED ORDER — POTASSIUM CHLORIDE CRYS ER 20 MEQ PO TBCR
40.0000 meq | EXTENDED_RELEASE_TABLET | Freq: Once | ORAL | Status: DC
  Filled 2016-06-09: qty 2

## 2016-06-09 MED ORDER — POTASSIUM CHLORIDE ER 10 MEQ PO TBCR: 10.0000 meq | EXTENDED_RELEASE_TABLET | Freq: Every day | ORAL | 0 refills | Status: DC

## 2016-06-09 MED ORDER — ONDANSETRON HCL 4 MG/2ML IJ SOLN
4.0000 mg | Freq: Once | INTRAMUSCULAR | Status: AC
  Administered 2016-06-09: 4 mg via INTRAVENOUS
  Filled 2016-06-09: qty 2

## 2016-06-09 MED ORDER — ONDANSETRON HCL 4 MG PO TABS: 4.0000 mg | ORAL_TABLET | Freq: Three times a day (TID) | ORAL | 0 refills | Status: DC | PRN

## 2016-06-09 NOTE — ED Triage Notes (Signed)
Pt stated having V/D since Monday. Vomited twice and unknown count of diarrhea episodes. No fever. MM wet.

## 2016-06-09 NOTE — ED Provider Notes (Signed)
[facility] Provider Note   CSN: [id number] Arrival date & time: 06/09/16  1349     History   Chief Complaint Chief Complaint  Patient presents with  . Diarrhea    HPI [name] is a 36 y.o. female.  HPI   Patient presents with vomiting and diarrhea starting 2 days ago. No known sick contacts. States she's had 2-3 episodes of vomiting daily and calculus episodes of diarrhea. No blood in either the stool or vomit. Chills without fever. Denies abdominal pain. This had mild lightheadedness with standing.  Past Medical History:  Diagnosis Date  . Abnormal Pap smear   . BV (bacterial vaginosis) 08/28/2012  . Diabetes mellitus without complication (HCC)   . Gestational diabetes    glyburide  . H/O abnormal Pap smear   . HSV-2 infection   . Seizures (HCC)    Had a bleed in her brain, after childbirth in 2009  . Unplanned wanted pregnancy 11/21/2012  . Vaginal Pap smear, abnormal     Patient Active Problem List   Diagnosis Date Noted  . History of gestational diabetes 04/08/2016  . Breast mass, right 04/08/2016  . H/O PRES (posterior reversible encephalopathy syndrome) 2009 06/18/2013  . 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 NO PAST SURGERIES      OB History    Gravida Para Term Preterm AB Living   8 7 7   1 7    SAB TAB Ectopic Multiple Live Births   0 1     7       Home Medications    Prior to Admission medications   Medication Sig Start Date End Date Taking? Authorizing Provider  ibuprofen (ADVIL,MOTRIN) 800 MG tablet Take 1 tablet (800 mg total) by mouth 3 (three) times daily. Patient not taking: Reported on 06/09/2016 04/25/16   Burgess AmorJulie Idol, PA-C  ondansetron (ZOFRAN) 4 MG tablet Take 1 tablet (4 mg total) by mouth every 8 (eight) hours as needed for nausea or vomiting. 06/09/16   Loren Raceravid Claudius Mich, MD  potassium chloride (K-DUR) 10 MEQ tablet Take 1 tablet (10 mEq total) by mouth daily. 06/09/16   Loren Raceravid  Travus Oren, MD    Family History Family History  Problem Relation Age of Onset  . COPD Maternal Grandmother     Social History Social History  Substance Use Topics  . Smoking status: Never Smoker  . Smokeless tobacco: Never Used  . Alcohol use No     Allergies   Patient has no known allergies.   Review of Systems Review of Systems  Constitutional: Positive for appetite change, chills and fatigue. Negative for fever.  Respiratory: Negative for shortness of breath.   Cardiovascular: Negative for chest pain, palpitations and leg swelling.  Gastrointestinal: Positive for diarrhea, nausea and vomiting. Negative for abdominal pain and constipation.  Genitourinary: Negative for dysuria, flank pain and frequency.  Musculoskeletal: Negative for back pain and neck stiffness.  Skin: Negative for rash and wound.  Neurological: Positive for light-headedness. Negative for dizziness, syncope, weakness, numbness and headaches.  All other systems reviewed and are negative.    Physical Exam Updated Vital Signs BP 96/61 (BP Location: Left Arm)   Pulse 84   Temp 98.6 F (37 C) (Oral)   Resp 14   Ht 4\' 10"  (1.473 m)   Wt 114 lb (51.7 kg)   LMP 05/16/2016   SpO2 100%   BMI 23.83 kg/m   Physical Exam  Constitutional: She  is oriented to person, place, and time. She appears well-developed and well-nourished.  HENT:  Head: Normocephalic and atraumatic.  Mouth/Throat: Oropharynx is clear and moist. No oropharyngeal exudate.  Eyes: EOM are normal. Pupils are equal, round, and reactive to light.  Neck: Normal range of motion. Neck supple.  Cardiovascular: Normal rate and regular rhythm.   Pulmonary/Chest: Effort normal and breath sounds normal.  Abdominal: Soft. Bowel sounds are normal. There is no tenderness. There is no rebound and no guarding.  Musculoskeletal: Normal range of motion. She exhibits no edema or tenderness.  Neurological: She is alert and oriented to person, place, and  time.  Moving all extremities without deficit. Sensation intact.  Skin: Skin is warm and dry. Capillary refill takes less than 2 seconds. No rash noted. No erythema.  Psychiatric: She has a normal mood and affect. Her behavior is normal.  Nursing note and vitals reviewed.    ED Treatments / Results  Labs (all labs ordered are listed, but only abnormal results are displayed) Labs Reviewed  COMPREHENSIVE METABOLIC PANEL - Abnormal; Notable for the following:       Result Value   Sodium 133 (*)    Potassium 3.1 (*)    Chloride 100 (*)    All other components within normal limits  CBC - Abnormal; Notable for the following:    RBC 5.29 (*)    All other components within normal limits  URINALYSIS, ROUTINE W REFLEX MICROSCOPIC - Abnormal; Notable for the following:    Hgb urine dipstick MODERATE (*)    All other components within normal limits  LIPASE, BLOOD  PREGNANCY, URINE    EKG  EKG Interpretation None       Radiology No results found.  Procedures Procedures (including critical care time)  Medications Ordered in ED Medications  potassium chloride SA (K-DUR,KLOR-CON) CR tablet 40 mEq (40 mEq Oral Not Given 06/09/16 1551)  sodium chloride 0.9 % bolus 2,000 mL (2,000 mLs Intravenous New Bag/Given 06/09/16 1551)  ondansetron (ZOFRAN) injection 4 mg (4 mg Intravenous Given 06/09/16 1551)     Initial Impression / Assessment and Plan / ED Course  I have reviewed the triage vital signs and the nursing notes.  Pertinent labs & imaging results that were available during my care of the patient were reviewed by me and considered in my medical decision making (see chart for details).  Clinical Course   Potassium placed in the emergency department. We'll give a short course of by mouth potassium and ODT Zofran. Encouraged to drink fluids. Return precautions given.  Final Clinical Impressions(s) / ED Diagnoses   Final diagnoses:  Nausea vomiting and diarrhea  Hypokalemia     New Prescriptions New Prescriptions   ONDANSETRON (ZOFRAN) 4 MG TABLET    Take 1 tablet (4 mg total) by mouth every 8 (eight) hours as needed for nausea or vomiting.   POTASSIUM CHLORIDE (K-DUR) 10 MEQ TABLET    Take 1 tablet (10 mEq total) by mouth daily.     Loren Racer, MD 06/09/16 [phone]

## 2016-06-09 NOTE — ED Notes (Signed)
When attempting to given patient PO potassium, patient states "I'm not good at taking pills. Do I have to take that? I would rather just get the fluid and let it help." Medication not given to patient and returned medication to pyxis.

## 2016-07-22 ENCOUNTER — Ambulatory Visit: Payer: Medicaid Other | Admitting: Advanced Practice Midwife

## 2016-09-28 ENCOUNTER — Ambulatory Visit (INDEPENDENT_AMBULATORY_CARE_PROVIDER_SITE_OTHER): Payer: Medicaid Other | Admitting: Advanced Practice Midwife

## 2016-09-28 ENCOUNTER — Encounter: Payer: Self-pay | Admitting: Advanced Practice Midwife

## 2016-09-28 VITALS — BP 122/84 | HR 68 | Ht <= 58 in | Wt 112.0 lb

## 2016-09-28 DIAGNOSIS — N923 Ovulation bleeding: Secondary | ICD-10-CM

## 2016-09-28 MED ORDER — PNV PRENATAL PLUS MULTIVITAMIN 27-1 MG PO TABS: 1.0000 | ORAL_TABLET | Freq: Every day | ORAL | 11 refills | Status: DC

## 2016-09-28 MED ORDER — METRONIDAZOLE 0.75 % VA GEL
1.0000 | Freq: Every day | VAGINAL | 1 refills | Status: DC
Start: 2016-09-28 — End: 2017-07-12

## 2016-09-28 NOTE — Progress Notes (Signed)
[facility] Visit  Patient name: [name] MRN [id number]  Date of birth: 1980-09-30  CC & HPI:  [name] is a 36 y.o.  female presenting today for spotting brown a few days before periods.  She has had sex 3-4 times in the past 3-4 months, but spotted red after each time. Had sex last week and did not spot.  Not on [facility], new boyfriend of 1 year (30yo) doesn;t have kids, wonders if he's fertile.   Pertinent History Reviewed:  Medical & Surgical Hx:   Past Medical History:  Diagnosis Date  . Abnormal Pap smear   . BV (bacterial vaginosis) 08/28/2012  . Diabetes mellitus without complication (HCC)   . Gestational diabetes    glyburide  . H/O abnormal Pap smear   . HSV-2 infection   . Seizures (HCC)    Had a bleed in her brain, after childbirth in 2009  . Unplanned wanted pregnancy 11/21/2012  . Vaginal Pap smear, abnormal    Past Surgical History:  Procedure Laterality Date  . NO PAST SURGERIES     Family History  Problem Relation Age of Onset  . COPD Maternal Grandmother     Current Outpatient Prescriptions:  .  ibuprofen (ADVIL,MOTRIN) 800 MG tablet, Take 1 tablet (800 mg total) by mouth 3 (three) times daily. (Patient not taking: Reported on 06/09/2016), Disp: 21 tablet, Rfl: 0 .  metroNIDAZOLE (METROGEL VAGINAL) 0.75 % vaginal gel, Place 1 Applicatorful vaginally at bedtime., Disp: 70 g, Rfl: 1 .  ondansetron (ZOFRAN) 4 MG tablet, Take 1 tablet (4 mg total) by mouth every 8 (eight) hours as needed for nausea or vomiting. (Patient not taking: Reported on 09/28/2016), Disp: 12 tablet, Rfl: 0 .  potassium chloride (K-DUR) 10 MEQ tablet, Take 1 tablet (10 mEq total) by mouth daily. (Patient not taking: Reported on 09/28/2016), Disp: 5 tablet, Rfl: 0 .  Prenatal Vit-Fe Fumarate-FA (PNV PRENATAL PLUS MULTIVITAMIN) 27-1 MG TABS, Take 1 tablet by mouth daily., Disp: 30 tablet, Rfl: 11 Social History: Reviewed -  reports that she has never smoked. She has never used  smokeless tobacco.  Review of Systems:   Constitutional: Negative for fever and chills Eyes: Negative for visual disturbances Respiratory: Negative for shortness of breath, dyspnea Cardiovascular: Negative for chest pain or palpitations  Gastrointestinal: Negative for vomiting, diarrhea and constipation; no abdominal pain Genitourinary: Negative for dysuria and urgency, vaginal irritation or itching Musculoskeletal: Negative for back pain, joint pain, myalgias  Neurological: Negative for dizziness and headaches    Objective Findings:    Physical Examination: General appearance - well appearing, and in no distress Mental status - alert, oriented to person, place, and time Chest:  Normal respiratory effort Heart - normal rate and regular rhythm Abdomen:  Soft, nontender Pelvic: normal appearing DC, no odor, wet prep few clue only.  Cx non friable Musculoskeletal:  Normal range of motion without pain Extremities:  No edema    No results found for this or any previous visit (from the past 24 hour(s)).    Assessment & Plan:  A:   Cervicitis vs endometrial spotting P:  Metrogel x5 nighst PNV Semen analysis in 6 months if needed   Return for If you have any problems.  CRESENZO-DISHMAN,Seward Coran CNM 09/28/2016 11:46 AM

## 2016-10-04 ENCOUNTER — Encounter: Payer: Self-pay | Admitting: Women's Health

## 2017-04-13 ENCOUNTER — Encounter: Payer: Self-pay | Admitting: Obstetrics & Gynecology

## 2017-04-13 ENCOUNTER — Ambulatory Visit: Payer: Self-pay | Admitting: Advanced Practice Midwife

## 2017-05-20 IMAGING — US US BREAST*R* LIMITED INC AXILLA
1 series · 7 of 7 positions shown · non-contrast
Comparison: None.

CLINICAL DATA: 34-year-old female presenting for evaluation of a
palpable area of concern in the right breast noted 1 week ago.No
known trauma to the right breast.

EXAM:
2D DIGITAL DIAGNOSTIC BILATERAL MAMMOGRAM WITH CAD AND ADJUNCT TOMO
RIGHT BREAST ULTRASOUND

[Series 1: us breast*right* limited inc axilla · 0.06mm/px · 7 of 7 slices shown]
[im 1/7]
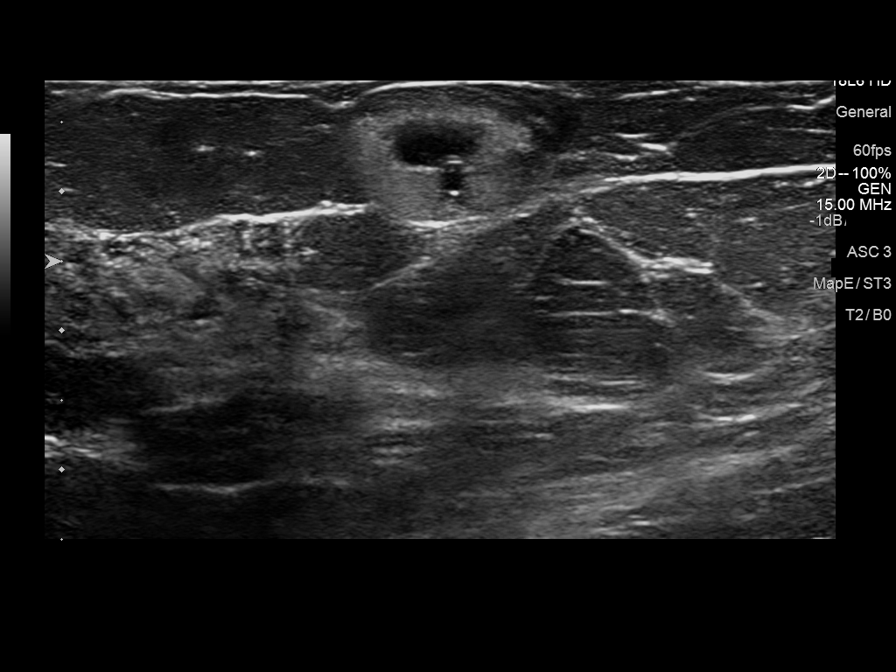
[im 2/7]
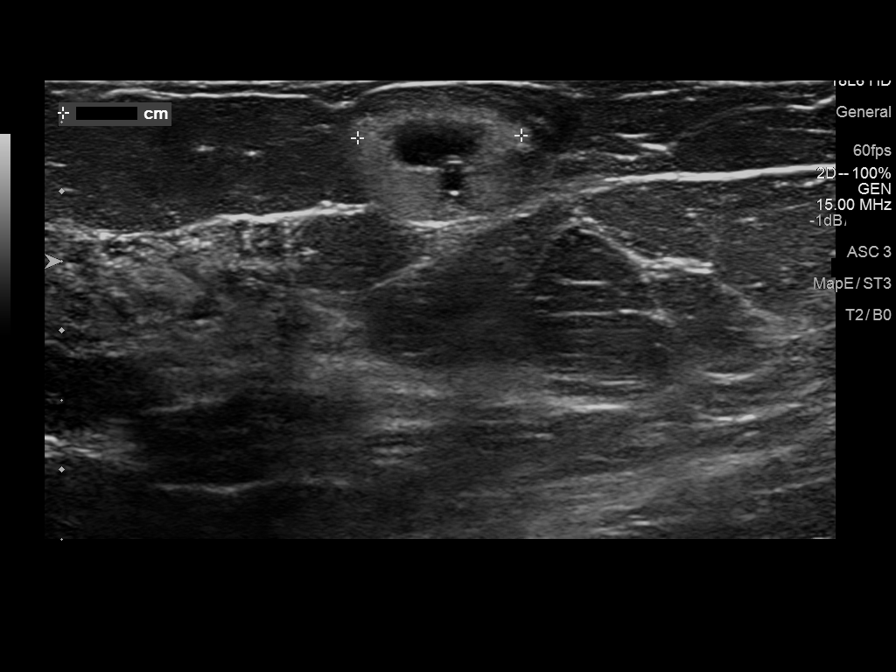
[im 3/7]
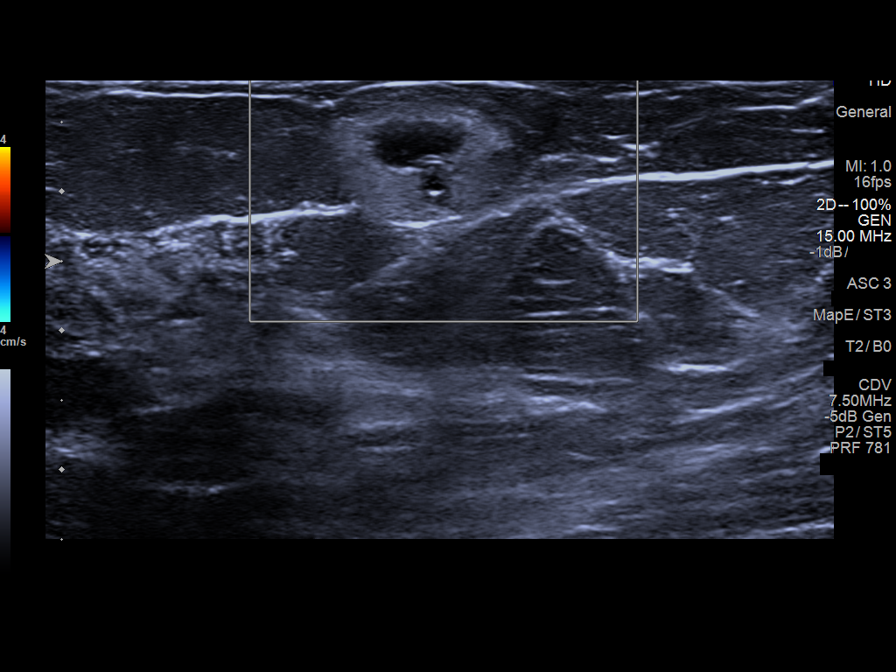
[im 4/7]
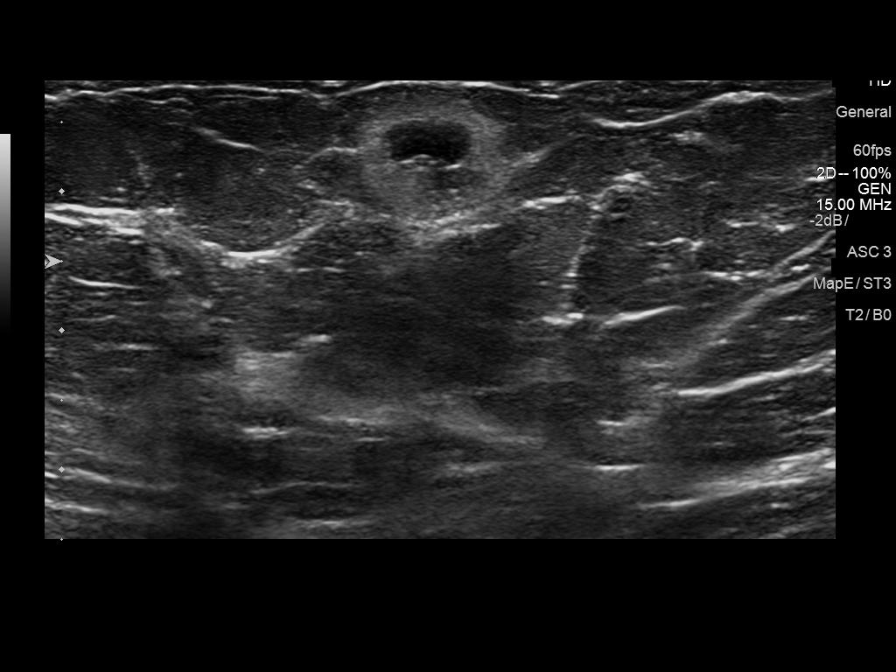
[im 5/7]
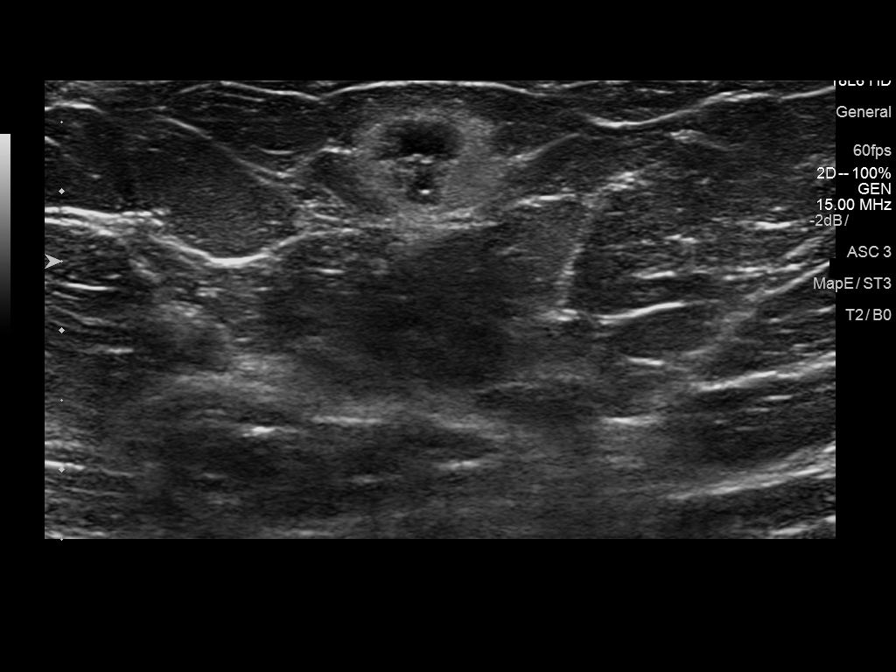
[im 6/7]
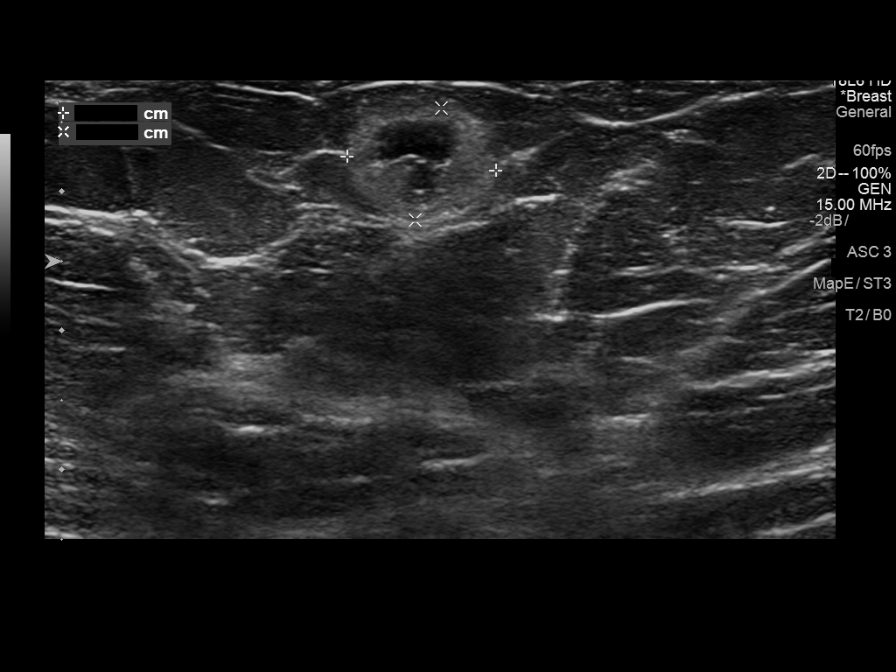
[im 7/7]
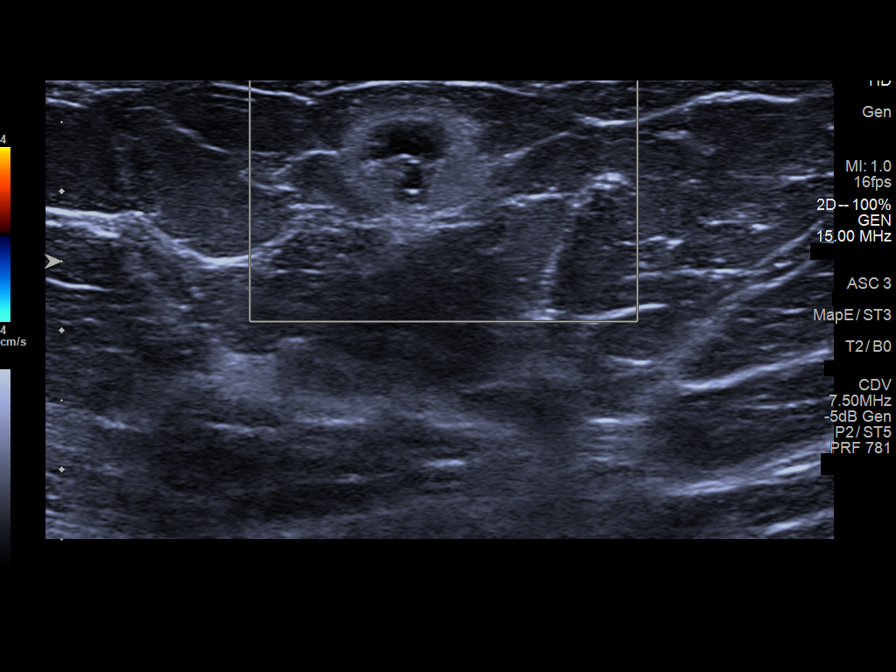

[7 of 7 positions shown; findings below may reference images not displayed]

ACR Breast Density Category c: The breast tissue is heterogeneously
dense, which may obscure small masses.
FINDINGS: A BB has been placed on the upper slightly inner quadrant of the
right breast. Deep to this marker there is a superficial mass
measuring approximately 0.8 cm. There also appears to be a lucent
halo around the mass which spans 1.5 cm, suggesting the possibility
of fat necrosis.

Mammographic images were processed with CAD.

There is a pea-sized palpable mass at the site of concern in the
upper inner right breast.

Ultrasound targeted to the right breast at 1 o'clock, 5 cm from the
nipple demonstrates 2 superficial near anechoic abutting masses with
a thick echogenic rim which altogether measures 1.2 x 0.8 x 1.1 cm.
IMPRESSION: 1. The palpable mass in the right breast is probably benign, most
consistent with fat necrosis.

2.  No mammographic evidence of malignancy in the bilateral breasts.

RECOMMENDATION:
1. A three-month follow-up right breast ultrasound is recommended to
document expected evolution/resolution of the presumed fat necrosis.

I have discussed the findings and recommendations with the patient.
Results were also provided in writing at the conclusion of the
visit. If applicable, a reminder letter will be sent to the patient
regarding the next appointment.

BI-RADS CATEGORY  3: Probably benign.

## 2017-06-14 ENCOUNTER — Other Ambulatory Visit: Payer: Self-pay

## 2017-06-14 ENCOUNTER — Ambulatory Visit: Payer: Medicaid Other | Admitting: Adult Health

## 2017-06-14 ENCOUNTER — Encounter: Payer: Self-pay | Admitting: Adult Health

## 2017-06-14 VITALS — BP 124/86 | HR 103 | Ht <= 58 in | Wt 114.0 lb

## 2017-06-14 DIAGNOSIS — O3680X0 Pregnancy with inconclusive fetal viability, not applicable or unspecified: Secondary | ICD-10-CM

## 2017-06-14 DIAGNOSIS — Z3201 Encounter for pregnancy test, result positive: Secondary | ICD-10-CM

## 2017-06-14 DIAGNOSIS — Z3490 Encounter for supervision of normal pregnancy, unspecified, unspecified trimester: Secondary | ICD-10-CM

## 2017-06-14 DIAGNOSIS — N926 Irregular menstruation, unspecified: Secondary | ICD-10-CM | POA: Diagnosis not present

## 2017-06-14 DIAGNOSIS — Z0001 Encounter for general adult medical examination with abnormal findings: Secondary | ICD-10-CM | POA: Insufficient documentation

## 2017-06-14 DIAGNOSIS — O09521 Supervision of elderly multigravida, first trimester: Secondary | ICD-10-CM

## 2017-06-14 LAB — POCT URINE PREGNANCY: PREG TEST UR: POSITIVE — AB

## 2017-06-14 NOTE — Patient Instructions (Signed)
First Trimester of Pregnancy The first trimester of pregnancy is from week 1 until the end of week 13 (months 1 through 3). A week after a sperm fertilizes an egg, the egg will implant on the wall of the uterus. This embryo will begin to develop into a baby. Genes from you and your partner will form the baby. The female genes will determine whether the baby will be a boy or a girl. At 6-8 weeks, the eyes and face will be formed, and the heartbeat can be seen on ultrasound. At the end of 12 weeks, all the baby's organs will be formed. Now that you are pregnant, you will want to do everything you can to have a healthy baby. Two of the most important things are to get good prenatal care and to follow your health care provider's instructions. Prenatal care is all the medical care you receive before the baby's birth. This care will help prevent, find, and treat any problems during the pregnancy and childbirth. Body changes during your first trimester Your body goes through many changes during pregnancy. The changes vary from woman to woman.  You may gain or lose a couple of pounds at first.  You may feel sick to your stomach (nauseous) and you may throw up (vomit). If the vomiting is uncontrollable, call your health care provider.  You may tire easily.  You may develop headaches that can be relieved by medicines. All medicines should be approved by your health care provider.  You may urinate more often. Painful urination may mean you have a bladder infection.  You may develop heartburn as a result of your pregnancy.  You may develop constipation because certain hormones are causing the muscles that push stool through your intestines to slow down.  You may develop hemorrhoids or swollen veins (varicose veins).  Your breasts may begin to grow larger and become tender. Your nipples may stick out more, and the tissue that surrounds them (areola) may become darker.  Your gums may bleed and may be  sensitive to brushing and flossing.  Dark spots or blotches (chloasma, mask of pregnancy) may develop on your face. This will likely fade after the baby is born.  Your menstrual periods will stop.  You may have a loss of appetite.  You may develop cravings for certain kinds of food.  You may have changes in your emotions from day to day, such as being excited to be pregnant or being concerned that something may go wrong with the pregnancy and baby.  You may have more vivid and strange dreams.  You may have changes in your hair. These can include thickening of your hair, rapid growth, and changes in texture. Some women also have hair loss during or after pregnancy, or hair that feels dry or thin. Your hair will most likely return to normal after your baby is born.  What to expect at prenatal visits During a routine prenatal visit:  You will be weighed to make sure you and the baby are growing normally.  Your blood pressure will be taken.  Your abdomen will be measured to track your baby's growth.  The fetal heartbeat will be listened to between weeks 10 and 14 of your pregnancy.  Test results from any previous visits will be discussed.  Your health care provider may ask you:  How you are feeling.  If you are feeling the baby move.  If you have had any abnormal symptoms, such as leaking fluid, bleeding, severe headaches,   or abdominal cramping.  If you are using any tobacco products, including cigarettes, chewing tobacco, and electronic cigarettes.  If you have any questions.  Other tests that may be performed during your first trimester include:  Blood tests to find your blood type and to check for the presence of any previous infections. The tests will also be used to check for low iron levels (anemia) and protein on red blood cells (Rh antibodies). Depending on your risk factors, or if you previously had diabetes during pregnancy, you may have tests to check for high blood  sugar that affects pregnant women (gestational diabetes).  Urine tests to check for infections, diabetes, or protein in the urine.  An ultrasound to confirm the proper growth and development of the baby.  Fetal screens for spinal cord problems (spina bifida) and Down syndrome.  HIV (human immunodeficiency virus) testing. Routine prenatal testing includes screening for HIV, unless you choose not to have this test.  You may need other tests to make sure you and the baby are doing well.  Follow these instructions at home: Medicines  Follow your health care provider's instructions regarding medicine use. Specific medicines may be either safe or unsafe to take during pregnancy.  Take a prenatal vitamin that contains at least 600 micrograms (mcg) of folic acid.  If you develop constipation, try taking a stool softener if your health care provider approves. Eating and drinking  Eat a balanced diet that includes fresh fruits and vegetables, whole grains, good sources of protein such as meat, eggs, or tofu, and low-fat dairy. Your health care provider will help you determine the amount of weight gain that is right for you.  Avoid raw meat and uncooked cheese. These carry germs that can cause birth defects in the baby.  Eating four or five small meals rather than three large meals a day may help relieve nausea and vomiting. If you start to feel nauseous, eating a few soda crackers can be helpful. Drinking liquids between meals, instead of during meals, also seems to help ease nausea and vomiting.  Limit foods that are high in fat and processed sugars, such as fried and sweet foods.  To prevent constipation: ? Eat foods that are high in fiber, such as fresh fruits and vegetables, whole grains, and beans. ? Drink enough fluid to keep your urine clear or pale yellow. Activity  Exercise only as directed by your health care provider. Most women can continue their usual exercise routine during  pregnancy. Try to exercise for 30 minutes at least 5 days a week. Exercising will help you: ? Control your weight. ? Stay in shape. ? Be prepared for labor and delivery.  Experiencing pain or cramping in the lower abdomen or lower back is a good sign that you should stop exercising. Check with your health care provider before continuing with normal exercises.  Try to avoid standing for long periods of time. Move your legs often if you must stand in one place for a long time.  Avoid heavy lifting.  Wear low-heeled shoes and practice good posture.  You may continue to have sex unless your health care provider tells you not to. Relieving pain and discomfort  Wear a good support bra to relieve breast tenderness.  Take warm sitz baths to soothe any pain or discomfort caused by hemorrhoids. Use hemorrhoid cream if your health care provider approves.  Rest with your legs elevated if you have leg cramps or low back pain.  If you develop   varicose veins in your legs, wear support hose. Elevate your feet for 15 minutes, 3-4 times a day. Limit salt in your diet. Prenatal care  Schedule your prenatal visits by the twelfth week of pregnancy. They are usually scheduled monthly at first, then more often in the last 2 months before delivery.  Write down your questions. Take them to your prenatal visits.  Keep all your prenatal visits as told by your health care provider. This is important. Safety  Wear your seat belt at all times when driving.  Make a list of emergency phone numbers, including numbers for family, friends, the hospital, and police and fire departments. General instructions  Ask your health care provider for a referral to a local prenatal education class. Begin classes no later than the beginning of month 6 of your pregnancy.  Ask for help if you have counseling or nutritional needs during pregnancy. Your health care provider can offer advice or refer you to specialists for help  with various needs.  Do not use hot tubs, steam rooms, or saunas.  Do not douche or use tampons or scented sanitary pads.  Do not cross your legs for long periods of time.  Avoid cat litter boxes and soil used by cats. These carry germs that can cause birth defects in the baby and possibly loss of the fetus by miscarriage or stillbirth.  Avoid all smoking, herbs, alcohol, and medicines not prescribed by your health care provider. Chemicals in these products affect the formation and growth of the baby.  Do not use any products that contain nicotine or tobacco, such as cigarettes and e-cigarettes. If you need help quitting, ask your health care provider. You may receive counseling support and other resources to help you quit.  Schedule a dentist appointment. At home, brush your teeth with a soft toothbrush and be gentle when you floss. Contact a health care provider if:  You have dizziness.  You have mild pelvic cramps, pelvic pressure, or nagging pain in the abdominal area.  You have persistent nausea, vomiting, or diarrhea.  You have a bad smelling vaginal discharge.  You have pain when you urinate.  You notice increased swelling in your face, hands, legs, or ankles.  You are exposed to fifth disease or chickenpox.  You are exposed to German measles (rubella) and have never had it. Get help right away if:  You have a fever.  You are leaking fluid from your vagina.  You have spotting or bleeding from your vagina.  You have severe abdominal cramping or pain.  You have rapid weight gain or loss.  You vomit blood or material that looks like coffee grounds.  You develop a severe headache.  You have shortness of breath.  You have any kind of trauma, such as from a fall or a car accident. Summary  The first trimester of pregnancy is from week 1 until the end of week 13 (months 1 through 3).  Your body goes through many changes during pregnancy. The changes vary from  woman to woman.  You will have routine prenatal visits. During those visits, your health care provider will examine you, discuss any test results you may have, and talk with you about how you are feeling. This information is not intended to replace advice given to you by your health care provider. Make sure you discuss any questions you have with your health care provider. Document Released: 05/18/2001 Document Revised: 05/05/2016 Document Reviewed: 05/05/2016 Elsevier Interactive Patient Education  2018 [employer]   [employer]  

## 2017-06-14 NOTE — Progress Notes (Signed)
Subjective:     Patient ID: Jillian FerrisAmanda R Peterson, female   DOB: 1980-12-12, 37 y.o.   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   Review of Systems +missed period with +HPT Denies, nausea,vomiting or bleeding Reviewed past medical,surgical, social and family history. Reviewed medications and allergies.     Objective:   Physical Exam BP 124/86 (BP Location: Right Arm, Patient Position: Sitting, Cuff Size: Normal)   Pulse (!) 103   Ht 4\' 10"  (1.473 m)   Wt 114 lb (51.7 kg)   LMP 05/16/2017   BMI 23.83 kg/m UPT +, about 4+4 weeks by LMP with EDD 02/20/18.Skin warm and dry. Neck: mid line trachea, normal thyroid, good ROM, no lymphadenopathy noted. Lungs: clear to ausculation bilaterally. Cardiovascular: regular rate and rhythm.Abdomen is soft and non tender.    Assessment:     1. Positive pregnancy test   2. Pregnancy, unspecified gestational age   533. Encounter to determine fetal viability of pregnancy, single or unspecified fetus   4. Elderly multigravida in first trimester       Plan:     Take PNV, has Rx Return in 2 weeks for dating US Review handouts on First trimester and by [facility]

## 2017-06-28 ENCOUNTER — Ambulatory Visit (INDEPENDENT_AMBULATORY_CARE_PROVIDER_SITE_OTHER): Payer: Medicaid Other

## 2017-06-28 DIAGNOSIS — O3680X0 Pregnancy with inconclusive fetal viability, not applicable or unspecified: Secondary | ICD-10-CM

## 2017-06-28 DIAGNOSIS — O09521 Supervision of elderly multigravida, first trimester: Secondary | ICD-10-CM | POA: Diagnosis not present

## 2017-06-28 DIAGNOSIS — Z3A01 Less than 8 weeks gestation of pregnancy: Secondary | ICD-10-CM

## 2017-06-28 NOTE — Progress Notes (Signed)
US 6+1 wks,single IUP w/ys,positive fht 112 bpm,simple right corpus luteal cyst 1.9 x 1.3 x 1.7 cm,normal left ovary,crl 3.6 mm,EDD 02/20/2018 BY LMP

## 2017-07-12 ENCOUNTER — Encounter: Payer: Self-pay | Admitting: Advanced Practice Midwife

## 2017-07-12 ENCOUNTER — Ambulatory Visit: Payer: Medicaid Other | Admitting: *Deleted

## 2017-07-12 ENCOUNTER — Ambulatory Visit (INDEPENDENT_AMBULATORY_CARE_PROVIDER_SITE_OTHER): Payer: Medicaid Other | Admitting: Advanced Practice Midwife

## 2017-07-12 VITALS — BP 110/78 | HR 68 | Wt 117.8 lb

## 2017-07-12 DIAGNOSIS — Z1389 Encounter for screening for other disorder: Secondary | ICD-10-CM

## 2017-07-12 DIAGNOSIS — Z3682 Encounter for antenatal screening for nuchal translucency: Secondary | ICD-10-CM | POA: Diagnosis not present

## 2017-07-12 DIAGNOSIS — O09291 Supervision of pregnancy with other poor reproductive or obstetric history, first trimester: Secondary | ICD-10-CM

## 2017-07-12 DIAGNOSIS — Z3481 Encounter for supervision of other normal pregnancy, first trimester: Secondary | ICD-10-CM

## 2017-07-12 DIAGNOSIS — Z331 Pregnant state, incidental: Secondary | ICD-10-CM

## 2017-07-12 DIAGNOSIS — Z3A08 8 weeks gestation of pregnancy: Secondary | ICD-10-CM

## 2017-07-12 DIAGNOSIS — Z8632 Personal history of gestational diabetes: Secondary | ICD-10-CM

## 2017-07-12 DIAGNOSIS — O0990 Supervision of high risk pregnancy, unspecified, unspecified trimester: Secondary | ICD-10-CM | POA: Insufficient documentation

## 2017-07-12 LAB — POCT URINALYSIS DIPSTICK
Glucose, UA: NEGATIVE
KETONES UA: NEGATIVE
LEUKOCYTES UA: NEGATIVE
NITRITE UA: NEGATIVE
Protein, UA: NEGATIVE
RBC UA: NEGATIVE

## 2017-07-12 NOTE — Progress Notes (Signed)
Subjective:    [name] is a [id number] 8364w1d being seen today for her first obstetrical visit.  Her obstetrical history is significant for . PRES/pp eclampsia in 2009.  She has not had GHTN/preX w/last 2 pregnancies since then. Was A1DM w/2011 pregnancey and A2DM w/2015 pregnancy.  THis is a new FOB, age 37, looks about 7719!, first baby.  Pregnancy history fully reviewed.  Patient reports no complaints.  Vitals:   07/12/17 1050  BP: 110/78  Pulse: 68  Weight: 117 lb 12.8 oz (53.4 kg)    HISTORY: OB History  Gravida Para Term Preterm AB Living  9 7 7   1 7   SAB TAB Ectopic Multiple Live Births  0 1     7    # Outcome Date GA Lbr Len/2nd Weight Sex Delivery Anes PTL Lv  9 Current           8 Term 07/10/13 3550w0d / 00:01 5 lb 12.6 oz (2.625 kg) F Vag-Spont None N LIV     Complications: Oligohydramnios,Gestational diabetes  7 Term 11/23/09 7050w0d  7 lb 11 oz (3.487 kg) M Vag-Spont EPI N LIV     Complications: Gestational diabetes     Birth Comments: A1DM  6 Term 08/20/07 750w0d  7 lb (3.175 kg) F Vag-Spont EPI N LIV     Birth Comments: PP PRES w/ subarachnoid bleed and intubation 1wk after birth  5 TAB 2004          4 Term 05/11/02 464w0d  8 lb (3.629 kg) M Vag-Spont EPI N LIV     Birth Comments: baby had open heart surgery to 're-route vein and close hole'  3 Term 03/28/01 2050w0d  8 lb (3.629 kg) M Vag-Spont None N LIV  2 Term 06/23/99 4150w0d  6 lb (2.722 kg) F Vag-Vacuum EPI N LIV  1 Term 07/28/98 6588w0d  7 lb 4 oz (3.289 kg) F Vag-Spont EPI N LIV     Past Medical History:  Diagnosis Date  . Abnormal Pap smear   . BV (bacterial vaginosis) 08/28/2012  . Diabetes mellitus without complication (HCC)   . Gestational diabetes    glyburide  . H/O abnormal Pap smear   . HSV-2 infection   . Seizures (HCC)    Had a bleed in her brain, after childbirth in 2009  . Unplanned wanted pregnancy 11/21/2012  . Vaginal Pap smear, abnormal    Past Surgical History:  Procedure Laterality  Date  . NO PAST SURGERIES     Family History  Problem Relation Age of Onset  . COPD Maternal Grandmother   . COPD Mother      Exam                                      System:     Skin: normal coloration and turgor, no rashes    Neurologic: oriented, normal, normal mood   Extremities: normal strength, tone, and muscle mass   HEENT PERRLA   Mouth/Teeth mucous membranes moist, normal dentition   Neck supple and no masses   Cardiovascular: regular rate and rhythm   Respiratory:  appears well, vitals normal, no respiratory distress, acyanotic   Abdomen: soft, non-tender;  FHR: 160US        The nature [facility] with multiple MDs and other Advanced Practice Providers was explained to patient; also emphasized  that residents, students are part of our team.  Assessment:    Pregnancy: [id number] Patient Active Problem List   Diagnosis Date Noted  . Supervision of normal pregnancy 07/12/2017  . Elderly multigravida in first trimester 06/14/2017  . History of gestational diabetes 04/08/2016  . Breast mass, right 04/08/2016  . H/O PRES (posterior reversible encephalopathy syndrome) 2009 06/18/2013  . 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 Hgb A1c added Will start ASA 162mg  >12 weeks Continue prenatal vitamins  Problem list reviewed and updated  Reviewed n/v relief measures and warning s/s to report  Reviewed recommended weight gain based on pre-gravid BMI  Encouraged well-balanced diet Genetic Screening discussed Integrated Screen: requested.  Ultrasound discussed; fetal survey: requested.  Return in about 5 weeks (around 08/16/2017) for LROB, US:NT+1st IT.  CRESENZO-DISHMAN,Lizzie An 07/12/2017

## 2017-07-12 NOTE — Patient Instructions (Signed)
 First Trimester of Pregnancy The first trimester of pregnancy is from week 1 until the end of week 12 (months 1 through 3). A week after a sperm fertilizes an egg, the egg will implant on the wall of the uterus. This embryo will begin to develop into a baby. Genes from you and your partner are forming the baby. The female genes determine whether the baby is a boy or a girl. At 6-8 weeks, the eyes and face are formed, and the heartbeat can be seen on ultrasound. At the end of 12 weeks, all the baby's organs are formed.  Now that you are pregnant, you will want to do everything you can to have a healthy baby. Two of the most important things are to get good prenatal care and to follow your health care provider's instructions. Prenatal care is all the medical care you receive before the baby's birth. This care will help prevent, find, and treat any problems during the pregnancy and childbirth. BODY CHANGES Your body goes through many changes during pregnancy. The changes vary from woman to woman.   You may gain or lose a couple of pounds at first.  You may feel sick to your stomach (nauseous) and throw up (vomit). If the vomiting is uncontrollable, call your health care provider.  You may tire easily.  You may develop headaches that can be relieved by medicines approved by your health care provider.  You may urinate more often. Painful urination may mean you have a bladder infection.  You may develop heartburn as a result of your pregnancy.  You may develop constipation because certain hormones are causing the muscles that push waste through your intestines to slow down.  You may develop hemorrhoids or swollen, bulging veins (varicose veins).  Your breasts may begin to grow larger and become tender. Your nipples may stick out more, and the tissue that surrounds them (areola) may become darker.  Your gums may bleed and may be sensitive to brushing and flossing.  Dark spots or blotches  (chloasma, mask of pregnancy) may develop on your face. This will likely fade after the baby is born.  Your menstrual periods will stop.  You may have a loss of appetite.  You may develop cravings for certain kinds of food.  You may have changes in your emotions from day to day, such as being excited to be pregnant or being concerned that something may go wrong with the pregnancy and baby.  You may have more vivid and strange dreams.  You may have changes in your hair. These can include thickening of your hair, rapid growth, and changes in texture. Some women also have hair loss during or after pregnancy, or hair that feels dry or thin. Your hair will most likely return to normal after your baby is born. WHAT TO EXPECT AT YOUR PRENATAL VISITS During a routine prenatal visit:  You will be weighed to make sure you and the baby are growing normally.  Your blood pressure will be taken.  Your abdomen will be measured to track your baby's growth.  The fetal heartbeat will be listened to starting around week 10 or 12 of your pregnancy.  Test results from any previous visits will be discussed. Your health care provider may ask you:  How you are feeling.  If you are feeling the baby move.  If you have had any abnormal symptoms, such as leaking fluid, bleeding, severe headaches, or abdominal cramping.  If you have any questions. Other   tests that may be performed during your first trimester include:  Blood tests to find your blood type and to check for the presence of any previous infections. They will also be used to check for low iron levels (anemia) and Rh antibodies. Later in the pregnancy, blood tests for diabetes will be done along with other tests if problems develop.  Urine tests to check for infections, diabetes, or protein in the urine.  An ultrasound to confirm the proper growth and development of the baby.  An amniocentesis to check for possible genetic problems.  Fetal  screens for spina bifida and Down syndrome.  You may need other tests to make sure you and the baby are doing well. HOME CARE INSTRUCTIONS  Medicines  Follow your health care provider's instructions regarding medicine use. Specific medicines may be either safe or unsafe to take during pregnancy.  Take your prenatal vitamins as directed.  If you develop constipation, try taking a stool softener if your health care provider approves. Diet  Eat regular, well-balanced meals. Choose a variety of foods, such as meat or vegetable-based protein, fish, milk and low-fat dairy products, vegetables, fruits, and whole grain breads and cereals. Your health care provider will help you determine the amount of weight gain that is right for you.  Avoid raw meat and uncooked cheese. These carry germs that can cause birth defects in the baby.  Eating four or five small meals rather than three large meals a day may help relieve nausea and vomiting. If you start to feel nauseous, eating a few soda crackers can be helpful. Drinking liquids between meals instead of during meals also seems to help nausea and vomiting.  If you develop constipation, eat more high-fiber foods, such as fresh vegetables or fruit and whole grains. Drink enough fluids to keep your urine clear or pale yellow. Activity and Exercise  Exercise only as directed by your health care provider. Exercising will help you:  Control your weight.  Stay in shape.  Be prepared for labor and delivery.  Experiencing pain or cramping in the lower abdomen or low back is a good sign that you should stop exercising. Check with your health care provider before continuing normal exercises.  Try to avoid standing for long periods of time. Move your legs often if you must stand in one place for a long time.  Avoid heavy lifting.  Wear low-heeled shoes, and practice good posture.  You may continue to have sex unless your health care provider directs you  otherwise. Relief of Pain or Discomfort  Wear a good support bra for breast tenderness.   Take warm sitz baths to soothe any pain or discomfort caused by hemorrhoids. Use hemorrhoid cream if your health care provider approves.   Rest with your legs elevated if you have leg cramps or low back pain.  If you develop varicose veins in your legs, wear support hose. Elevate your feet for 15 minutes, 3-4 times a day. Limit salt in your diet. Prenatal Care  Schedule your prenatal visits by the twelfth week of pregnancy. They are usually scheduled monthly at first, then more often in the last 2 months before delivery.  Write down your questions. Take them to your prenatal visits.  Keep all your prenatal visits as directed by your health care provider. Safety  Wear your seat belt at all times when driving.  Make a list of emergency phone numbers, including numbers for family, friends, the hospital, and police and fire departments. General   Tips  Ask your health care provider for a referral to a local prenatal education class. Begin classes no later than at the beginning of month 6 of your pregnancy.  Ask for help if you have counseling or nutritional needs during pregnancy. Your health care provider can offer advice or refer you to specialists for help with various needs.  Do not use hot tubs, steam rooms, or saunas.  Do not douche or use tampons or scented sanitary pads.  Do not cross your legs for long periods of time.  Avoid cat litter boxes and soil used by cats. These carry germs that can cause birth defects in the baby and possibly loss of the fetus by miscarriage or stillbirth.  Avoid all smoking, herbs, alcohol, and medicines not prescribed by your health care provider. Chemicals in these affect the formation and growth of the baby.  Schedule a dentist appointment. At home, brush your teeth with a soft toothbrush and be gentle when you floss. SEEK MEDICAL CARE IF:   You have  dizziness.  You have mild pelvic cramps, pelvic pressure, or nagging pain in the abdominal area.  You have persistent nausea, vomiting, or diarrhea.  You have a bad smelling vaginal discharge.  You have pain with urination.  You notice increased swelling in your face, hands, legs, or ankles. SEEK IMMEDIATE MEDICAL CARE IF:   You have a fever.  You are leaking fluid from your vagina.  You have spotting or bleeding from your vagina.  You have severe abdominal cramping or pain.  You have rapid weight gain or loss.  You vomit blood or material that looks like coffee grounds.  You are exposed to German measles and have never had them.  You are exposed to fifth disease or chickenpox.  You develop a severe headache.  You have shortness of breath.  You have any kind of trauma, such as from a fall or a car accident. Document Released: 05/18/2001 Document Revised: 10/08/2013 Document Reviewed: 04/03/2013 [employer] Patient Information 2015 [employer]. This information is not intended to replace advice given to you by your health care provider. Make sure you discuss any questions you have with your health care provider.   Nausea & Vomiting  Have saltine crackers or pretzels by your bed and eat a few bites before you raise your head out of bed in the morning  Eat small frequent meals throughout the day instead of large meals  Drink plenty of fluids throughout the day to stay hydrated, just don't drink a lot of fluids with your meals.  This can make your stomach fill up faster making you feel sick  Do not brush your teeth right after you eat  Products with real ginger are good for nausea, like ginger ale and ginger hard candy Make sure it says made with real ginger!  Sucking on sour candy like lemon heads is also good for nausea  If your prenatal vitamins make you nauseated, take them at night so you will sleep through the nausea  Sea Bands  If you feel like you need  medicine for the nausea & vomiting please let us know  If you are unable to keep any fluids or food down please let us know   Constipation  Drink plenty of fluid, preferably water, throughout the day  Eat foods high in fiber such as fruits, vegetables, and grains  Exercise, such as walking, is a good way to keep your bowels regular  Drink warm fluids, especially warm   prune juice, or decaf coffee  Eat a 1/2 cup of real oatmeal (not instant), 1/2 cup applesauce, and 1/2-1 cup warm prune juice every day  If needed, you may take Colace (docusate sodium) stool softener once or twice a day to help keep the stool soft. If you are pregnant, wait until you are out of your first trimester (12-14 weeks of pregnancy)  If you still are having problems with constipation, you may take Miralax once daily as needed to help keep your bowels regular.  If you are pregnant, wait until you are out of your first trimester (12-14 weeks of pregnancy)  Safe Medications in Pregnancy   Acne: Benzoyl Peroxide Salicylic Acid  Backache/Headache: Tylenol: 2 regular strength every 4 hours OR              2 Extra strength every 6 hours  Colds/Coughs/Allergies: Benadryl (alcohol free) 25 mg every 6 hours as needed Breath right strips Claritin Cepacol throat lozenges Chloraseptic throat spray Cold-Eeze- up to three times per day Cough drops, alcohol free Flonase (by prescription only) Guaifenesin Mucinex Robitussin DM (plain only, alcohol free) Saline nasal spray/drops Sudafed (pseudoephedrine) & Actifed ** use only after [redacted] weeks gestation and if you do not have high blood pressure Tylenol Vicks Vaporub Zinc lozenges Zyrtec   Constipation: Colace Ducolax suppositories Fleet enema Glycerin suppositories Metamucil Milk of magnesia Miralax Senokot Smooth move tea  Diarrhea: Kaopectate Imodium A-D  *NO pepto Bismol  Hemorrhoids: Anusol Anusol HC Preparation  H Tucks  Indigestion: Tums Maalox Mylanta Zantac  Pepcid  Insomnia: Benadryl (alcohol free) 25mg every 6 hours as needed Tylenol PM Unisom, no Gelcaps  Leg Cramps: Tums MagGel  Nausea/Vomiting:  Bonine Dramamine Emetrol Ginger extract Sea bands Meclizine  Nausea medication to take during pregnancy:  Unisom (doxylamine succinate 25 mg tablets) Take one tablet daily at bedtime. If symptoms are not adequately controlled, the dose can be increased to a maximum recommended dose of two tablets daily (1/2 tablet in the morning, 1/2 tablet mid-afternoon and one at bedtime). Vitamin B6 100mg tablets. Take one tablet twice a day (up to 200 mg per day).  Skin Rashes: Aveeno products Benadryl cream or 25mg every 6 hours as needed Calamine Lotion 1% cortisone cream  Yeast infection: Gyne-lotrimin 7 Monistat 7   **If taking multiple medications, please check labels to avoid duplicating the same active ingredients **take medication as directed on the label ** Do not exceed 4000 mg of tylenol in 24 hours **Do not take medications that contain aspirin or ibuprofen      

## 2017-07-13 LAB — PMP SCREEN PROFILE (10S), URINE
AMPHETAMINE SCREEN URINE: NEGATIVE ng/mL
BARBITURATE SCREEN URINE: NEGATIVE ng/mL
BENZODIAZEPINE SCREEN, URINE: NEGATIVE ng/mL
CANNABINOIDS UR QL SCN: NEGATIVE ng/mL
COCAINE(METAB.)SCREEN, URINE: NEGATIVE ng/mL
Creatinine(Crt), U: 59.4 mg/dL (ref 20.0–300.0)
METHADONE SCREEN, URINE: NEGATIVE ng/mL
OPIATE SCREEN URINE: NEGATIVE ng/mL
OXYCODONE+OXYMORPHONE UR QL SCN: NEGATIVE ng/mL
PHENCYCLIDINE QUANTITATIVE URINE: NEGATIVE ng/mL
PROPOXYPHENE SCREEN URINE: NEGATIVE ng/mL
Ph of Urine: 6.5 (ref 4.5–8.9)

## 2017-07-13 LAB — MICROSCOPIC EXAMINATION: Casts: NONE SEEN /lpf

## 2017-07-13 LAB — HEMOGLOBIN A1C
Est. average glucose Bld gHb Est-mCnc: 111 mg/dL
Hgb A1c MFr Bld: 5.5 % (ref 4.8–5.6)

## 2017-07-13 LAB — CBC
HEMATOCRIT: 39.2 % (ref 34.0–46.6)
Hemoglobin: 13 g/dL (ref 11.1–15.9)
MCH: 27.8 pg (ref 26.6–33.0)
MCHC: 33.2 g/dL (ref 31.5–35.7)
MCV: 84 fL (ref 79–97)
Platelets: 323 10*3/uL (ref 150–379)
RBC: 4.67 x10E6/uL (ref 3.77–5.28)
RDW: 12.9 % (ref 12.3–15.4)
WBC: 11.8 10*3/uL — ABNORMAL HIGH (ref 3.4–10.8)

## 2017-07-13 LAB — URINALYSIS, ROUTINE W REFLEX MICROSCOPIC
Bilirubin, UA: NEGATIVE
Glucose, UA: NEGATIVE
KETONES UA: NEGATIVE
NITRITE UA: NEGATIVE
PH UA: 7 (ref 5.0–7.5)
Protein, UA: NEGATIVE
RBC, UA: NEGATIVE
SPEC GRAV UA: 1.01 (ref 1.005–1.030)
Urobilinogen, Ur: 0.2 mg/dL (ref 0.2–1.0)

## 2017-07-13 LAB — HEPATITIS B SURFACE ANTIGEN: Hepatitis B Surface Ag: NEGATIVE

## 2017-07-13 LAB — HIV ANTIBODY (ROUTINE TESTING W REFLEX): HIV Screen 4th Generation wRfx: NONREACTIVE

## 2017-07-13 LAB — RUBELLA SCREEN: Rubella Antibodies, IGG: 2.21 index (ref >0.99)

## 2017-07-13 LAB — MED LIST OPTION NOT SELECTED

## 2017-07-13 LAB — ANTIBODY SCREEN: Antibody Screen: NEGATIVE

## 2017-07-13 LAB — RPR: RPR Ser Ql: NONREACTIVE

## 2017-07-14 LAB — URINE CULTURE: ORGANISM ID, BACTERIA: NO GROWTH

## 2017-07-15 LAB — GC/CHLAMYDIA PROBE AMP
Chlamydia trachomatis, NAA: NEGATIVE
NEISSERIA GONORRHOEAE BY PCR: NEGATIVE

## 2017-07-18 ENCOUNTER — Telehealth: Payer: Self-pay | Admitting: Advanced Practice Midwife

## 2017-07-18 NOTE — Telephone Encounter (Signed)
Pt called requesting lab results. DOB verified. Informed pt that all labs drawn on 07/12/17 looked normal. Pt verbalized understanding.

## 2017-07-22 ENCOUNTER — Telehealth: Payer: Self-pay | Admitting: Advanced Practice Midwife

## 2017-07-22 NOTE — Telephone Encounter (Signed)
Patient states she woke up this morning noticed a "glob" of mucous when she went to the bathroom. She is not having any bleeding, cramping, vaginal odor or itching. Advised patient to continue to monitor and if she has any of the above mentioned, give us a call or go [facility]. Informed that if she were having a miscarriage, unfortunately, there isn't anything that could be done at this time. Pt verbalized understanding with no further questions.

## 2017-08-16 ENCOUNTER — Ambulatory Visit (INDEPENDENT_AMBULATORY_CARE_PROVIDER_SITE_OTHER): Payer: Medicaid Other | Admitting: Advanced Practice Midwife

## 2017-08-16 ENCOUNTER — Encounter: Payer: Self-pay | Admitting: Advanced Practice Midwife

## 2017-08-16 ENCOUNTER — Other Ambulatory Visit: Payer: Self-pay

## 2017-08-16 ENCOUNTER — Encounter: Payer: Medicaid Other | Admitting: Obstetrics & Gynecology

## 2017-08-16 ENCOUNTER — Ambulatory Visit (INDEPENDENT_AMBULATORY_CARE_PROVIDER_SITE_OTHER): Payer: Medicaid Other

## 2017-08-16 VITALS — BP 102/64 | HR 79 | Wt 118.0 lb

## 2017-08-16 DIAGNOSIS — Z3A13 13 weeks gestation of pregnancy: Secondary | ICD-10-CM

## 2017-08-16 DIAGNOSIS — Z3682 Encounter for antenatal screening for nuchal translucency: Secondary | ICD-10-CM

## 2017-08-16 DIAGNOSIS — Z3481 Encounter for supervision of other normal pregnancy, first trimester: Secondary | ICD-10-CM

## 2017-08-16 DIAGNOSIS — O0941 Supervision of pregnancy with grand multiparity, first trimester: Secondary | ICD-10-CM

## 2017-08-16 DIAGNOSIS — Z331 Pregnant state, incidental: Secondary | ICD-10-CM

## 2017-08-16 DIAGNOSIS — Z3402 Encounter for supervision of normal first pregnancy, second trimester: Secondary | ICD-10-CM

## 2017-08-16 DIAGNOSIS — Z1389 Encounter for screening for other disorder: Secondary | ICD-10-CM

## 2017-08-16 LAB — POCT URINALYSIS DIPSTICK
Blood, UA: NEGATIVE
Glucose, UA: NEGATIVE
KETONES UA: NEGATIVE
Leukocytes, UA: NEGATIVE
NITRITE UA: NEGATIVE
PROTEIN UA: NEGATIVE

## 2017-08-16 MED ORDER — AMOXICILLIN 500 MG PO CAPS: 500.0000 mg | ORAL_CAPSULE | Freq: Three times a day (TID) | ORAL | 0 refills | Status: DC

## 2017-08-16 NOTE — Progress Notes (Signed)
US 13+1 wks,measurements c/w dates,NB present,NT 1.9 mm,crl 74.28 mm,normal ovaries bilat,posterior pl gr 0,fhr 164 bpm

## 2017-08-16 NOTE — Progress Notes (Addendum)
  [id number] Estimated Date of Delivery: 02/20/18  Blood pressure 102/64, pulse 79, weight 118 lb (53.5 kg), last menstrual period 05/16/2017.   BP weight and urine results all reviewed and noted.  Please refer to the obstetrical flow sheet for the fundal height and fetal heart rate documentation:  Patient denies any bleeding and no rupture of membranes symptoms or regular contractions. Patient c/o top right molar pain. Knows she needs to get it pulled, it flares up about q 6 months or so.  All questions were answered.   Physical Assessment:   Vitals:   08/16/17 0856  BP: 102/64  Pulse: 79  Weight: 118 lb (53.5 kg)  Body mass index is 24.66 kg/m.        Physical Examination:   General appearance: Well appearing, and in no distress  Mental status: Alert, oriented to person, place, and time  Skin: Warm & dry  Cardiovascular: Normal heart rate noted  Respiratory: Normal respiratory effort, no distress  Abdomen: Soft, gravid, nontender  Pelvic: Cervical exam deferred         Extremities: Edema: None  Fetal Status: Fetal Heart Rate (bpm): 164       US 13+1 wks,measurements c/w dates,NB present,NT 1.9 mm,crl 74.28 mm,normal ovaries bilat,posterior pl gr 0,fhr 164 bpm   No results found for this or any previous visit (from the past 24 hour(s)).   Orders Placed This Encounter  Procedures  . Integrated 1  . POCT urinalysis dipstick    Plan:  Continued routine obstetrical care,  rx amoxicillin  Return in about 4 weeks (around 09/13/2017) for LROB, 2nd IT.

## 2017-08-18 LAB — INTEGRATED 1
CROWN RUMP LENGTH MAT SCREEN: 74.3 mm
Gest. Age on Collection Date: 13.3 weeks
Maternal Age at EDD: 36.8 yr
Nuchal Translucency (NT): 1.9 mm
Number of Fetuses: 1
PAPP-A Value: 2706.4 ng/mL
Weight: 118 [lb_av]

## 2017-08-25 ENCOUNTER — Telehealth: Payer: Self-pay | Admitting: Advanced Practice Midwife

## 2017-08-25 NOTE — Telephone Encounter (Signed)
Called patient back and informed her that we would not have the final results until the second sample is drawn for the integrated screen.

## 2017-08-25 NOTE — Telephone Encounter (Signed)
Patient called stating that she would like to know the results of her blood work. Pt states that she seen they have came in on her my chart. Please contact pt

## 2017-09-13 ENCOUNTER — Ambulatory Visit (INDEPENDENT_AMBULATORY_CARE_PROVIDER_SITE_OTHER): Payer: Medicaid Other | Admitting: Advanced Practice Midwife

## 2017-09-13 ENCOUNTER — Other Ambulatory Visit: Payer: Self-pay

## 2017-09-13 ENCOUNTER — Encounter: Payer: Self-pay | Admitting: Advanced Practice Midwife

## 2017-09-13 VITALS — BP 112/74 | HR 98 | Wt 124.0 lb

## 2017-09-13 DIAGNOSIS — Z1389 Encounter for screening for other disorder: Secondary | ICD-10-CM

## 2017-09-13 DIAGNOSIS — Z363 Encounter for antenatal screening for malformations: Secondary | ICD-10-CM

## 2017-09-13 DIAGNOSIS — Z1379 Encounter for other screening for genetic and chromosomal anomalies: Secondary | ICD-10-CM

## 2017-09-13 DIAGNOSIS — O0942 Supervision of pregnancy with grand multiparity, second trimester: Secondary | ICD-10-CM

## 2017-09-13 DIAGNOSIS — Z3A17 17 weeks gestation of pregnancy: Secondary | ICD-10-CM

## 2017-09-13 DIAGNOSIS — Z3482 Encounter for supervision of other normal pregnancy, second trimester: Secondary | ICD-10-CM

## 2017-09-13 DIAGNOSIS — Z331 Pregnant state, incidental: Secondary | ICD-10-CM

## 2017-09-13 LAB — POCT URINALYSIS DIPSTICK
Blood, UA: NEGATIVE
GLUCOSE UA: NEGATIVE
KETONES UA: NEGATIVE
Leukocytes, UA: NEGATIVE
NITRITE UA: NEGATIVE
PROTEIN UA: NEGATIVE

## 2017-09-13 NOTE — Patient Instructions (Signed)
Jillian FerrisAmanda R Peterson, I greatly value your feedback.  If you receive a survey following your visit with us today, we appreciate you taking the time to fill it out.  Thanks, Jillian BeamsFran Peterson, CNM     Second Trimester of Pregnancy The second trimester is from week 14 through week 27 (months 4 through 6). The second trimester is often a time when you feel your best. Your body has adjusted to being pregnant, and you begin to feel better physically. Usually, morning sickness has lessened or quit completely, you may have more energy, and you may have an increase in appetite. The second trimester is also a time when the fetus is growing rapidly. At the end of the sixth month, the fetus is about 9 inches long and weighs about 1 pounds. You will likely begin to feel the baby move (quickening) between 16 and 20 weeks of pregnancy. Body changes during your second trimester Your body continues to go through many changes during your second trimester. The changes vary from woman to woman.  Your weight will continue to increase. You will notice your lower abdomen bulging out.  You may begin to get stretch marks on your hips, abdomen, and breasts.  You may develop headaches that can be relieved by medicines. The medicines should be approved by your health care provider.  You may urinate more often because the fetus is pressing on your bladder.  You may develop or continue to have heartburn as a result of your pregnancy.  You may develop constipation because certain hormones are causing the muscles that push waste through your intestines to slow down.  You may develop hemorrhoids or swollen, bulging veins (varicose veins).  You may have back pain. This is caused by: ? Weight gain. ? Pregnancy hormones that are relaxing the joints in your pelvis. ? A shift in weight and the muscles that support your balance.  Your breasts will continue to grow and they will continue to become tender.  Your gums may  bleed and may be sensitive to brushing and flossing.  Dark spots or blotches (chloasma, mask of pregnancy) may develop on your face. This will likely fade after the baby is born.  A dark line from your belly button to the pubic area (linea nigra) may appear. This will likely fade after the baby is born.  You may have changes in your hair. These can include thickening of your hair, rapid growth, and changes in texture. Some women also have hair loss during or after pregnancy, or hair that feels dry or thin. Your hair will most likely return to normal after your baby is born.  What to expect at prenatal visits During a routine prenatal visit:  You will be weighed to make sure you and the fetus are growing normally.  Your blood pressure will be taken.  Your abdomen will be measured to track your baby's growth.  The fetal heartbeat will be listened to.  Any test results from the previous visit will be discussed.  Your health care provider may ask you:  How you are feeling.  If you are feeling the baby move.  If you have had any abnormal symptoms, such as leaking fluid, bleeding, severe headaches, or abdominal cramping.  If you are using any tobacco products, including cigarettes, chewing tobacco, and electronic cigarettes.  If you have any questions.  Other tests that may be performed during your second trimester include:  Blood tests that check for: ? Low iron levels (anemia). ?  High blood sugar that affects pregnant women (gestational diabetes) between 20 and 28 weeks. ? Rh antibodies. This is to check for a protein on red blood cells (Rh factor).  Urine tests to check for infections, diabetes, or protein in the urine.  An ultrasound to confirm the proper growth and development of the baby.  An amniocentesis to check for possible genetic problems.  Fetal screens for spina bifida and Down syndrome.  HIV (human immunodeficiency virus) testing. Routine prenatal testing  includes screening for HIV, unless you choose not to have this test.  Follow these instructions at home: Medicines  Follow your health care provider's instructions regarding medicine use. Specific medicines may be either safe or unsafe to take during pregnancy.  Take a prenatal vitamin that contains at least 600 micrograms (mcg) of folic acid.  If you develop constipation, try taking a stool softener if your health care provider approves. Eating and drinking  Eat a balanced diet that includes fresh fruits and vegetables, whole grains, good sources of protein such as meat, eggs, or tofu, and low-fat dairy. Your health care provider will help you determine the amount of weight gain that is right for you.  Avoid raw meat and uncooked cheese. These carry germs that can cause birth defects in the baby.  If you have low calcium intake from food, talk to your health care provider about whether you should take a daily calcium supplement.  Limit foods that are high in fat and processed sugars, such as fried and sweet foods.  To prevent constipation: ? Drink enough fluid to keep your urine clear or pale yellow. ? Eat foods that are high in fiber, such as fresh fruits and vegetables, whole grains, and beans. Activity  Exercise only as directed by your health care provider. Most women can continue their usual exercise routine during pregnancy. Try to exercise for 30 minutes at least 5 days a week. Stop exercising if you experience uterine contractions.  Avoid heavy lifting, wear low heel shoes, and practice good posture.  A sexual relationship may be continued unless your health care provider directs you otherwise. Relieving pain and discomfort  Wear a good support bra to prevent discomfort from breast tenderness.  Take warm sitz baths to soothe any pain or discomfort caused by hemorrhoids. Use hemorrhoid cream if your health care provider approves.  Rest with your legs elevated if you have  leg cramps or low back pain.  If you develop varicose veins, wear support hose. Elevate your feet for 15 minutes, 3-4 times a day. Limit salt in your diet. Prenatal Care  Write down your questions. Take them to your prenatal visits.  Keep all your prenatal visits as told by your health care provider. This is important. Safety  Wear your seat belt at all times when driving.  Make a list of emergency phone numbers, including numbers for family, friends, the hospital, and police and fire departments. General instructions  Ask your health care provider for a referral to a local prenatal education class. Begin classes no later than the beginning of month 6 of your pregnancy.  Ask for help if you have counseling or nutritional needs during pregnancy. Your health care provider can offer advice or refer you to specialists for help with various needs.  Do not use hot tubs, steam rooms, or saunas.  Do not douche or use tampons or scented sanitary pads.  Do not cross your legs for long periods of time.  Avoid cat litter boxes and  soil used by cats. These carry germs that can cause birth defects in the baby and possibly loss of the fetus by miscarriage or stillbirth.  Avoid all smoking, herbs, alcohol, and unprescribed drugs. Chemicals in these products can affect the formation and growth of the baby.  Do not use any products that contain nicotine or tobacco, such as cigarettes and e-cigarettes. If you need help quitting, ask your health care provider.  Visit your dentist if you have not gone yet during your pregnancy. Use a soft toothbrush to brush your teeth and be gentle when you floss. Contact a health care provider if:  You have dizziness.  You have mild pelvic cramps, pelvic pressure, or nagging pain in the abdominal area.  You have persistent nausea, vomiting, or diarrhea.  You have a bad smelling vaginal discharge.  You have pain when you urinate. Get help right away if:  You  have a fever.  You are leaking fluid from your vagina.  You have spotting or bleeding from your vagina.  You have severe abdominal cramping or pain.  You have rapid weight gain or weight loss.  You have shortness of breath with chest pain.  You notice sudden or extreme swelling of your face, hands, ankles, feet, or legs.  You have not felt your baby move in over an hour.  You have severe headaches that do not go away when you take medicine.  You have vision changes. Summary  The second trimester is from week 14 through week 27 (months 4 through 6). It is also a time when the fetus is growing rapidly.  Your body goes through many changes during pregnancy. The changes vary from woman to woman.  Avoid all smoking, herbs, alcohol, and unprescribed drugs. These chemicals affect the formation and growth your baby.  Do not use any tobacco products, such as cigarettes, chewing tobacco, and e-cigarettes. If you need help quitting, ask your health care provider.  Contact your health care provider if you have any questions. Keep all prenatal visits as told by your health care provider. This is important. This information is not intended to replace advice given to you by your health care provider. Make sure you discuss any questions you have with your health care provider.      CHILDBIRTH CLASSES [phone] is the phone number for Pregnancy Classes or hospital tours at [place] will be referred to  [email or web address] for more information on childbirth classes  At this site you may register for classes. You may sign up for a waiting list if classes are full. Please SIGN UP FOR THIS!.   When the waiting list becomes long, sometimes new classes can be added.

## 2017-09-13 NOTE — Progress Notes (Signed)
  [id number] Estimated Date of Delivery: 02/20/18  Blood pressure 112/74, pulse 98, weight 124 lb (56.2 kg), last menstrual period 05/16/2017.   BP weight and urine results all reviewed and noted.  Please refer to the obstetrical flow sheet for the fundal height and fetal heart rate documentation:  Patient reports good fetal movement, denies any bleeding and no rupture of membranes symptoms or regular contractions. Patient is without complaints. All questions were answered.   Physical Assessment:   Vitals:   09/13/17 0836  BP: 112/74  Pulse: 98  Weight: 124 lb (56.2 kg)  Body mass index is 25.92 kg/m.        Physical Examination:   General appearance: Well appearing, and in no distress  Mental status: Alert, oriented to person, place, and time  Skin: Warm & dry  Cardiovascular: Normal heart rate noted  Respiratory: Normal respiratory effort, no distress  Abdomen: Soft, gravid, nontender  Pelvic: Cervical exam deferred         Extremities: Edema: None  Fetal Status:     Movement: Present    Results for orders placed or performed in visit on 09/13/17 (from the past 24 hour(s))  POCT urinalysis dipstick   Collection Time: 09/13/17  8:39 AM  Result Value Ref Range   Color, UA     Clarity, UA     Glucose, UA neg    Bilirubin, UA     Ketones, UA neg    Spec Grav, UA  1.010 - 1.025   Blood, UA neg    pH, UA  5.0 - 8.0   Protein, UA neg    Urobilinogen, UA  0.2 or 1.0 E.U./dL   Nitrite, UA neg    Leukocytes, UA Negative Negative   Appearance     Odor       Orders Placed This Encounter  Procedures  . INTEGRATED 2  . POCT urinalysis dipstick    Plan:  Continued routine obstetrical care,   Return in about 1 week (around 09/20/2017) for JY:NWGNFAOS:Anatomy only; 4 weeks LROB.

## 2017-09-15 LAB — INTEGRATED 2
ADSF: 1.92
AFP MARKER: 47.1 ng/mL
AFP MoM: 1.14
CROWN RUMP LENGTH: 74.3 mm
DIA MoM: 0.96
DIA Value: 187.2 pg/mL
ESTRIOL UNCONJUGATED: 2.11 ng/mL
GESTATIONAL AGE: 17.3 wk
Gest. Age on Collection Date: 13.3 weeks
Maternal Age at EDD: 36.8 yr
NUCHAL TRANSLUCENCY MOM: 1.11
Nuchal Translucency (NT): 1.9 mm
Number of Fetuses: 1
PAPP-A MoM: 1.57
PAPP-A Value: 2706.4 ng/mL
TEST RESULTS: NEGATIVE
Weight: 118 [lb_av]
Weight: 118 [lb_av]
hCG MoM: 1.03
hCG Value: 31 IU/mL

## 2017-09-20 ENCOUNTER — Ambulatory Visit (INDEPENDENT_AMBULATORY_CARE_PROVIDER_SITE_OTHER): Payer: Medicaid Other

## 2017-09-20 DIAGNOSIS — Z363 Encounter for antenatal screening for malformations: Secondary | ICD-10-CM | POA: Diagnosis not present

## 2017-09-20 DIAGNOSIS — Z3402 Encounter for supervision of normal first pregnancy, second trimester: Secondary | ICD-10-CM

## 2017-09-20 NOTE — Progress Notes (Signed)
US 18+1 wks,cephalic,cx 3.5 cm,posterior pl gr 0,normal ovaries bilat,svp of fluid 5 cm,fhr 159 bpm,efw 253 g,anatomy complete,no obvious abnormalities

## 2017-10-11 ENCOUNTER — Other Ambulatory Visit: Payer: Self-pay

## 2017-10-11 ENCOUNTER — Ambulatory Visit (INDEPENDENT_AMBULATORY_CARE_PROVIDER_SITE_OTHER): Payer: Medicaid Other | Admitting: Advanced Practice Midwife

## 2017-10-11 ENCOUNTER — Encounter: Payer: Self-pay | Admitting: Advanced Practice Midwife

## 2017-10-11 VITALS — BP 100/56 | HR 85 | Wt 126.0 lb

## 2017-10-11 DIAGNOSIS — Z1389 Encounter for screening for other disorder: Secondary | ICD-10-CM

## 2017-10-11 DIAGNOSIS — Z331 Pregnant state, incidental: Secondary | ICD-10-CM

## 2017-10-11 DIAGNOSIS — O0942 Supervision of pregnancy with grand multiparity, second trimester: Secondary | ICD-10-CM

## 2017-10-11 DIAGNOSIS — O99612 Diseases of the digestive system complicating pregnancy, second trimester: Secondary | ICD-10-CM

## 2017-10-11 DIAGNOSIS — R12 Heartburn: Secondary | ICD-10-CM

## 2017-10-11 DIAGNOSIS — Z3482 Encounter for supervision of other normal pregnancy, second trimester: Secondary | ICD-10-CM

## 2017-10-11 DIAGNOSIS — Z3A21 21 weeks gestation of pregnancy: Secondary | ICD-10-CM

## 2017-10-11 DIAGNOSIS — K219 Gastro-esophageal reflux disease without esophagitis: Secondary | ICD-10-CM

## 2017-10-11 LAB — POCT URINALYSIS DIPSTICK
Blood, UA: NEGATIVE
GLUCOSE UA: NEGATIVE
KETONES UA: NEGATIVE
Leukocytes, UA: NEGATIVE
NITRITE UA: NEGATIVE
PROTEIN UA: NEGATIVE

## 2017-10-11 MED ORDER — OMEPRAZOLE 20 MG PO CPDR: 20.0000 mg | DELAYED_RELEASE_CAPSULE | Freq: Every day | ORAL | 6 refills | Status: DC

## 2017-10-11 NOTE — Progress Notes (Signed)
  [id number] [redacted]w[redacted]d Estimated Date of Delivery: 02/20/18  Blood pressure (!) 100/56, pulse 85, weight 126 lb (57.2 kg), last menstrual period 05/16/2017.   BP weight and urine results all reviewed and noted.  Please refer to the obstetrical flow sheet for the fundal height and fetal heart rate documentation:  Patient reports good fetal movement, denies any bleeding and no rupture of membranes symptoms or regular contractions. Patient has heartburn, some reflux All questions were answered.   Physical Assessment:   Vitals:   10/11/17 0903  BP: (!) 100/56  Pulse: 85  Weight: 126 lb (57.2 kg)  Body mass index is 26.33 kg/m.        Physical Examination:   General appearance: Well appearing, and in no distress  Mental status: Alert, oriented to person, place, and time  Skin: Warm & dry  Cardiovascular: Normal heart rate noted  Respiratory: Normal respiratory effort, no distress  Abdomen: Soft, gravid, nontender  Pelvic: Cervical exam deferred         Extremities: Edema: None  Fetal Status:     Movement: Present    Results for orders placed or performed in visit on 10/11/17 (from the past 24 hour(s))  POCT urinalysis dipstick   Collection Time: 10/11/17  9:05 AM  Result Value Ref Range   Color, UA     Clarity, UA     Glucose, UA neg    Bilirubin, UA     Ketones, UA neg    Spec Grav, UA  1.010 - 1.025   Blood, UA neg    pH, UA  5.0 - 8.0   Protein, UA neg    Urobilinogen, UA  0.2 or 1.0 E.U./dL   Nitrite, UA neg    Leukocytes, UA Negative Negative   Appearance     Odor       Orders Placed This Encounter  Procedures  . POCT urinalysis dipstick    Plan:  Continued routine obstetrical care, prilosec for GERD  Return in about 1 month (around 11/08/2017) for LROB.

## 2017-10-11 NOTE — Patient Instructions (Signed)
[name], I greatly value your feedback.  If you receive a survey following your visit with [place] today, we appreciate you taking the time to fill it out.  Thanks, [name], CNM     Second Trimester of Pregnancy The second trimester is from week 14 through week 27 (months 4 through 6). The second trimester is often a time when you feel your best. Your body has adjusted to being pregnant, and you begin to feel better physically. Usually, morning sickness has lessened or quit completely, you may have more energy, and you may have an increase in appetite. The second trimester is also a time when the fetus is growing rapidly. At the end of the sixth month, the fetus is about 9 inches long and weighs about 1 pounds. You will likely begin to feel the baby move (quickening) between 16 and 20 weeks of pregnancy. Body changes during your second trimester Your body continues to go through many changes during your second trimester. The changes vary from woman to woman.  Your weight will continue to increase. You will notice your lower abdomen bulging out.  You may begin to get stretch marks on your hips, abdomen, and breasts.  You may develop headaches that can be relieved by medicines. The medicines should be approved by your health care provider.  You may urinate more often because the fetus is pressing on your bladder.  You may develop or continue to have heartburn as a result of your pregnancy.  You may develop constipation because certain hormones are causing the muscles that push waste through your intestines to slow down.  You may develop hemorrhoids or swollen, bulging veins (varicose veins).  You may have back pain. This is caused by: ? Weight gain. ? Pregnancy hormones that are relaxing the joints in your pelvis. ? A shift in weight and the muscles that support your balance.  Your breasts will continue to grow and they will continue to become tender.  Your gums may  bleed and may be sensitive to brushing and flossing.  Dark spots or blotches (chloasma, mask of pregnancy) may develop on your face. This will likely fade after the baby is born.  A dark line from your belly button to the pubic area (linea nigra) may appear. This will likely fade after the baby is born.  You may have changes in your hair. These can include thickening of your hair, rapid growth, and changes in texture. Some women also have hair loss during or after pregnancy, or hair that feels dry or thin. Your hair will most likely return to normal after your baby is born.  What to expect at prenatal visits During a routine prenatal visit:  You will be weighed to make sure you and the fetus are growing normally.  Your blood pressure will be taken.  Your abdomen will be measured to track your baby's growth.  The fetal heartbeat will be listened to.  Any test results from the previous visit will be discussed.  Your health care provider may ask you:  How you are feeling.  If you are feeling the baby move.  If you have had any abnormal symptoms, such as leaking fluid, bleeding, severe headaches, or abdominal cramping.  If you are using any tobacco products, including cigarettes, chewing tobacco, and electronic cigarettes.  If you have any questions.  Other tests that may be performed during your second trimester include:  Blood tests that check for: ? Low iron levels (anemia). ?  High blood sugar that affects pregnant women (gestational diabetes) between 20 and 28 weeks. ? Rh antibodies. This is to check for a protein on red blood cells (Rh factor).  Urine tests to check for infections, diabetes, or protein in the urine.  An ultrasound to confirm the proper growth and development of the baby.  An amniocentesis to check for possible genetic problems.  Fetal screens for spina bifida and Down syndrome.  HIV (human immunodeficiency virus) testing. Routine prenatal testing  includes screening for HIV, unless you choose not to have this test.  Follow these instructions at home: Medicines  Follow your health care provider's instructions regarding medicine use. Specific medicines may be either safe or unsafe to take during pregnancy.  Take a prenatal vitamin that contains at least 600 micrograms (mcg) of folic acid.  If you develop constipation, try taking a stool softener if your health care provider approves. Eating and drinking  Eat a balanced diet that includes fresh fruits and vegetables, whole grains, good sources of protein such as meat, eggs, or tofu, and low-fat dairy. Your health care provider will help you determine the amount of weight gain that is right for you.  Avoid raw meat and uncooked cheese. These carry germs that can cause birth defects in the baby.  If you have low calcium intake from food, talk to your health care provider about whether you should take a daily calcium supplement.  Limit foods that are high in fat and processed sugars, such as fried and sweet foods.  To prevent constipation: ? Drink enough fluid to keep your urine clear or pale yellow. ? Eat foods that are high in fiber, such as fresh fruits and vegetables, whole grains, and beans. Activity  Exercise only as directed by your health care provider. Most women can continue their usual exercise routine during pregnancy. Try to exercise for 30 minutes at least 5 days a week. Stop exercising if you experience uterine contractions.  Avoid heavy lifting, wear low heel shoes, and practice good posture.  A sexual relationship may be continued unless your health care provider directs you otherwise. Relieving pain and discomfort  Wear a good support bra to prevent discomfort from breast tenderness.  Take warm sitz baths to soothe any pain or discomfort caused by hemorrhoids. Use hemorrhoid cream if your health care provider approves.  Rest with your legs elevated if you have  leg cramps or low back pain.  If you develop varicose veins, wear support hose. Elevate your feet for 15 minutes, 3-4 times a day. Limit salt in your diet. Prenatal Care  Write down your questions. Take them to your prenatal visits.  Keep all your prenatal visits as told by your health care provider. This is important. Safety  Wear your seat belt at all times when driving.  Make a list of emergency phone numbers, including numbers for family, friends, the hospital, and police and fire departments. General instructions  Ask your health care provider for a referral to a local prenatal education class. Begin classes no later than the beginning of month 6 of your pregnancy.  Ask for help if you have counseling or nutritional needs during pregnancy. Your health care provider can offer advice or refer you to specialists for help with various needs.  Do not use hot tubs, steam rooms, or saunas.  Do not douche or use tampons or scented sanitary pads.  Do not cross your legs for long periods of time.  Avoid cat litter boxes and  soil used by cats. These carry germs that can cause birth defects in the baby and possibly loss of the fetus by miscarriage or stillbirth.  Avoid all smoking, herbs, alcohol, and unprescribed drugs. Chemicals in these products can affect the formation and growth of the baby.  Do not use any products that contain nicotine or tobacco, such as cigarettes and e-cigarettes. If you need help quitting, ask your health care provider.  Visit your dentist if you have not gone yet during your pregnancy. Use a soft toothbrush to brush your teeth and be gentle when you floss. Contact a health care provider if:  You have dizziness.  You have mild pelvic cramps, pelvic pressure, or nagging pain in the abdominal area.  You have persistent nausea, vomiting, or diarrhea.  You have a bad smelling vaginal discharge.  You have pain when you urinate. Get help right away if:  You  have a fever.  You are leaking fluid from your vagina.  You have spotting or bleeding from your vagina.  You have severe abdominal cramping or pain.  You have rapid weight gain or weight loss.  You have shortness of breath with chest pain.  You notice sudden or extreme swelling of your face, hands, ankles, feet, or legs.  You have not felt your baby move in over an hour.  You have severe headaches that do not go away when you take medicine.  You have vision changes. Summary  The second trimester is from week 14 through week 27 (months 4 through 6). It is also a time when the fetus is growing rapidly.  Your body goes through many changes during pregnancy. The changes vary from woman to woman.  Avoid all smoking, herbs, alcohol, and unprescribed drugs. These chemicals affect the formation and growth your baby.  Do not use any tobacco products, such as cigarettes, chewing tobacco, and e-cigarettes. If you need help quitting, ask your health care provider.  Contact your health care provider if you have any questions. Keep all prenatal visits as told by your health care provider. This is important. This information is not intended to replace advice given to you by your health care provider. Make sure you discuss any questions you have with your health care provider.      CHILDBIRTH CLASSES [phone] is the phone number for Pregnancy Classes or hospital tours at [place] will be referred to  [email or web address] for more information on childbirth classes  At this site you may register for classes. You may sign up for a waiting list if classes are full. Please SIGN UP FOR THIS!.   When the waiting list becomes long, sometimes new classes can be added.

## 2017-11-08 ENCOUNTER — Ambulatory Visit (INDEPENDENT_AMBULATORY_CARE_PROVIDER_SITE_OTHER): Payer: Medicaid Other | Admitting: Advanced Practice Midwife

## 2017-11-08 ENCOUNTER — Encounter: Payer: Self-pay | Admitting: Advanced Practice Midwife

## 2017-11-08 ENCOUNTER — Other Ambulatory Visit: Payer: Self-pay

## 2017-11-08 VITALS — BP 102/68 | HR 86 | Wt 131.0 lb

## 2017-11-08 DIAGNOSIS — Z3402 Encounter for supervision of normal first pregnancy, second trimester: Secondary | ICD-10-CM

## 2017-11-08 DIAGNOSIS — Z1389 Encounter for screening for other disorder: Secondary | ICD-10-CM

## 2017-11-08 DIAGNOSIS — Z331 Pregnant state, incidental: Secondary | ICD-10-CM

## 2017-11-08 DIAGNOSIS — O0942 Supervision of pregnancy with grand multiparity, second trimester: Secondary | ICD-10-CM

## 2017-11-08 DIAGNOSIS — Z3A25 25 weeks gestation of pregnancy: Secondary | ICD-10-CM

## 2017-11-08 LAB — POCT URINALYSIS DIPSTICK
Blood, UA: NEGATIVE
Glucose, UA: NEGATIVE
Ketones, UA: NEGATIVE
LEUKOCYTES UA: NEGATIVE
NITRITE UA: NEGATIVE
PROTEIN UA: NEGATIVE

## 2017-11-08 NOTE — Patient Instructions (Addendum)
1. Before your test, do not eat or drink anything for 8-10 hours prior to your  appointment (a small amount of water is allowed and you may take any medicines you normally take). Be sure to drink lots of water the day before. 2. When you arrive, your blood will be drawn for a 'fasting' blood sugar level.  Then you will be given a sweetened carbonated beverage to drink. You should  complete drinking this beverage within five minutes. After finishing the  beverage, you will have your blood drawn exactly 1 and 2 hours later. Having  your blood drawn on time is an important part of this test. A total of three blood  samples will be done. 3. The test takes approximately 2  hours. During the test, do not have anything to  eat or drink. Do not smoke, chew gum (not even sugarless gum) or use breath mints.  4. During the test you should remain close by and seated as much as possible and  avoid walking around. You may want to bring a book or something else to  occupy your time.  5. After your test, you may eat and drink as normal. You may want to bring a snack  to eat after the test is finished. Your provider will advise you as to the results of  this test and any follow-up if necessary  If your sugar test is positive for gestational diabetes, you will be given an phone call and further instructions discussed. If you wish to know all of your test results before your next appointment, feel free to call the office, or look up your test results on Mychart.  (The range that the lab uses for normal values of the sugar test are not necessarily the range that is used for pregnant women; if your results are within the normal range, they are definitely normal.  However, if a value is deemed "high" by the lab, it may not be too high for a pregnant woman.  We will need to discuss the results if your value(s) fall in the "high" category).     Tdap Vaccine  It is recommended that you get the Tdap vaccine during the  third trimester of EACH pregnancy to help protect your baby from getting pertussis (whooping cough)  27-36 weeks is the BEST time to do this so that you can pass the protection on to your baby. During pregnancy is better than after pregnancy, but if you are unable to get it during pregnancy it will be offered at the hospital.  You will be offered this vaccine in the office after 26 weeks.  If you do not have health insurance, you can get the vaccine from the [facility] (no appointment needed).   Everyone who will be around your baby should also be up-to-date on their vaccines. Adults (who are not pregnant) only need 1 dose of Tdap during adulthood.

## 2017-11-08 NOTE — Progress Notes (Signed)
  [id number] 5972w1d Estimated Date of Delivery: 02/20/18  Blood pressure 102/68, pulse 86, weight 131 lb (59.4 kg), last menstrual period 05/16/2017.   BP weight and urine results all reviewed and noted.  Please refer to the obstetrical flow sheet for the fundal height and fetal heart rate documentation:  Patient reports good fetal movement, denies any bleeding and no rupture of membranes symptoms or regular contractions.  Patient is without complaints. Taking reflux medicine on a prn basis.  All questions were answered.   Physical Assessment:   Vitals:   11/08/17 0845  BP: 102/68  Pulse: 86  Weight: 131 lb (59.4 kg)  Body mass index is 27.38 kg/m.        Physical Examination:   General appearance: Well appearing, and in no distress  Mental status: Alert, oriented to person, place, and time  Skin: Warm & dry  Cardiovascular: Normal heart rate noted  Respiratory: Normal respiratory effort, no distress  Abdomen: Soft, gravid, nontender  Pelvic: Cervical exam deferred         Extremities: Edema: None  Fetal Status:     Movement: Present    Results for orders placed or performed in visit on 11/08/17 (from the past 24 hour(s))  POCT urinalysis dipstick   Collection Time: 11/08/17  8:46 AM  Result Value Ref Range   Color, UA     Clarity, UA     Glucose, UA Negative Negative   Bilirubin, UA     Ketones, UA neg    Spec Grav, UA  1.010 - 1.025   Blood, UA neg    pH, UA  5.0 - 8.0   Protein, UA Negative Negative   Urobilinogen, UA  0.2 or 1.0 E.U./dL   Nitrite, UA neg    Leukocytes, UA Negative Negative   Appearance     Odor       Orders Placed This Encounter  Procedures  . POCT urinalysis dipstick    Plan:  Continued routine obstetrical care,   Return in about 3 weeks (around 11/29/2017) for PN2/LROB.

## 2017-11-29 ENCOUNTER — Ambulatory Visit (INDEPENDENT_AMBULATORY_CARE_PROVIDER_SITE_OTHER): Payer: Medicaid Other | Admitting: Women's Health

## 2017-11-29 ENCOUNTER — Other Ambulatory Visit: Payer: Medicaid Other

## 2017-11-29 ENCOUNTER — Encounter: Payer: Self-pay | Admitting: Women's Health

## 2017-11-29 VITALS — BP 112/72 | HR 92 | Wt 133.0 lb

## 2017-11-29 DIAGNOSIS — O09293 Supervision of pregnancy with other poor reproductive or obstetric history, third trimester: Secondary | ICD-10-CM

## 2017-11-29 DIAGNOSIS — Z641 Problems related to multiparity: Secondary | ICD-10-CM | POA: Insufficient documentation

## 2017-11-29 DIAGNOSIS — I6783 Posterior reversible encephalopathy syndrome: Secondary | ICD-10-CM

## 2017-11-29 DIAGNOSIS — Z3483 Encounter for supervision of other normal pregnancy, third trimester: Secondary | ICD-10-CM

## 2017-11-29 DIAGNOSIS — Z3A28 28 weeks gestation of pregnancy: Secondary | ICD-10-CM

## 2017-11-29 DIAGNOSIS — R768 Other specified abnormal immunological findings in serum: Secondary | ICD-10-CM

## 2017-11-29 DIAGNOSIS — Z331 Pregnant state, incidental: Secondary | ICD-10-CM

## 2017-11-29 DIAGNOSIS — Z1389 Encounter for screening for other disorder: Secondary | ICD-10-CM

## 2017-11-29 DIAGNOSIS — O0942 Supervision of pregnancy with grand multiparity, second trimester: Secondary | ICD-10-CM

## 2017-11-29 DIAGNOSIS — Z131 Encounter for screening for diabetes mellitus: Secondary | ICD-10-CM

## 2017-11-29 LAB — POCT URINALYSIS DIPSTICK
Glucose, UA: NEGATIVE
Ketones, UA: NEGATIVE
Leukocytes, UA: NEGATIVE
Nitrite, UA: NEGATIVE
Protein, UA: NEGATIVE
RBC UA: NEGATIVE

## 2017-11-29 NOTE — Progress Notes (Signed)
   LOW-RISK PREGNANCY VISIT Patient name: [name] MRN [id number]  Date of birth: [birth year] Chief Complaint:   Routine Prenatal Visit (pn2)  History of Present Illness:   [name] is a 37 y.o. [id number] female at 2653w1d with an Estimated Date of Delivery: 02/20/18 being seen today for ongoing management of a low-risk pregnancy.  Today she reports no complaints. Has not started baby ASA for h/o PRES/eclampsia/subarachnoid bleed, states she never heard anything else about it, and she didn't have any problems w/ previous 2 pregnancies. This is new FOB.  . Vag. Bleeding: None.  Movement: Present. denies leaking of fluid. Review of Systems:   Pertinent items are noted in HPI Denies abnormal vaginal discharge w/ itching/odor/irritation, headaches, visual changes, shortness of breath, chest pain, abdominal pain, severe nausea/vomiting, or problems with urination or bowel movements unless otherwise stated above. Pertinent History Reviewed:  Reviewed past medical,surgical, social, obstetrical and family history.  Reviewed problem list, medications and allergies. Physical Assessment:   Vitals:   11/29/17 0905  BP: 112/72  Pulse: 92  Weight: 133 lb (60.3 kg)  Body mass index is 27.8 kg/m.        Physical Examination:   General appearance: Well appearing, and in no distress  Mental status: Alert, oriented to person, place, and time  Skin: Warm & dry  Cardiovascular: Normal heart rate noted  Respiratory: Normal respiratory effort, no distress  Abdomen: Soft, gravid, nontender  Pelvic: Cervical exam deferred         Extremities: Edema: None  Fetal Status: Fetal Heart Rate (bpm): 140 Fundal Height: 28 cm Movement: Present    Results for orders placed or performed in visit on 11/29/17 (from the past 24 hour(s))  POCT Urinalysis Dipstick   Collection Time: 11/29/17  9:06 AM  Result Value Ref Range   Color, UA     Clarity, UA     Glucose, UA Negative Negative   Bilirubin, UA     Ketones, UA neg    Spec Grav, UA  1.010 - 1.025   Blood, UA neg    pH, UA  5.0 - 8.0   Protein, UA Negative Negative   Urobilinogen, UA  0.2 or 1.0 E.U./dL   Nitrite, UA neg    Leukocytes, UA Negative Negative   Appearance     Odor      Assessment & Plan:  1) Low-risk pregnancy [id number] at 9353w1d with an Estimated Date of Delivery: 02/20/18   2) H/O PRES/eclampsia/subarachnoid bleed, discussed w/ [facility], although optimal timing to start baby ASA would have been 12wks as she was initially directed, to go ahead and start now, 162mg  daily  3) H/O GDM> doing PN2 today  4) Grand multiparity   Meds: No orders of the defined types were placed in this encounter.  Labs/procedures today: pn2, declines tdap today-maybe next visit  Plan:  Continue routine obstetrical care   Reviewed: Preterm labor symptoms and general obstetric precautions including but not limited to vaginal bleeding, contractions, leaking of fluid and fetal movement were reviewed in detail with the patient.  All questions were answered  Follow-up: Return in about 1 month (around 12/27/2017) for LROB.  Orders Placed This Encounter  Procedures  . POCT Urinalysis Dipstick   Cheral MarkerKimberly R Emilliano Dilworth CNM, [facility] 11/29/2017 10:37 AM

## 2017-11-29 NOTE — Patient Instructions (Addendum)
Bradley FerrisAmanda R Beharry, I greatly value your feedback.  If you receive a survey following your visit with us today, we appreciate you taking the time to fill it out.  Thanks, Joellyn HaffKim Booker, CNM, WHNP-BC  Begin taking 162mg  (two 81mg  tablets) baby aspirin daily to decrease risk of preeclampsia during pregnancy    Call the office [phone]) or go to [facility] if:  You begin to have strong, frequent contractions  Your water breaks.  Sometimes it is a big gush of fluid, sometimes it is just a trickle that keeps getting your panties wet or running down your legs  You have vaginal bleeding.  It is normal to have a small amount of spotting if your cervix was checked.   You don't feel your baby moving like normal.  If you don't, get you something to eat and drink and lay down and focus on feeling your baby move.  You should feel at least 10 movements in 2 hours.  If you don't, you should call the office or go to [facility].    Tdap Vaccine  It is recommended that you get the Tdap vaccine during the third trimester of EACH pregnancy to help protect your baby from getting pertussis (whooping cough)  27-36 weeks is the BEST time to do this so that you can pass the protection on to your baby. During pregnancy is better than after pregnancy, but if you are unable to get it during pregnancy it will be offered at the hospital.   You can get this vaccine at the health department or your family doctor  Everyone who will be around your baby should also be up-to-date on their vaccines. Adults (who are not pregnant) only need 1 dose of Tdap during adulthood.   Third Trimester of Pregnancy The third trimester is from week 29 through week 42, months 7 through 9. The third trimester is a time when the fetus is growing rapidly. At the end of the ninth month, the fetus is about 20 inches in length and weighs 6-10 pounds.  BODY CHANGES Your body goes through many changes during pregnancy. The changes vary from  woman to woman.   Your weight will continue to increase. You can expect to gain 25-35 pounds (11-16 kg) by the end of the pregnancy.  You may begin to get stretch marks on your hips, abdomen, and breasts.  You may urinate more often because the fetus is moving lower into your pelvis and pressing on your bladder.  You may develop or continue to have heartburn as a result of your pregnancy.  You may develop constipation because certain hormones are causing the muscles that push waste through your intestines to slow down.  You may develop hemorrhoids or swollen, bulging veins (varicose veins).  You may have pelvic pain because of the weight gain and pregnancy hormones relaxing your joints between the bones in your pelvis. Backaches may result from overexertion of the muscles supporting your posture.  You may have changes in your hair. These can include thickening of your hair, rapid growth, and changes in texture. Some women also have hair loss during or after pregnancy, or hair that feels dry or thin. Your hair will most likely return to normal after your baby is born.  Your breasts will continue to grow and be tender. A yellow discharge may leak from your breasts called colostrum.  Your belly button may stick out.  You may feel short of breath because of your expanding uterus.  You may notice the fetus "dropping," or moving lower in your abdomen.  You may have a bloody mucus discharge. This usually occurs a few days to a week before labor begins.  Your cervix becomes thin and soft (effaced) near your due date. WHAT TO EXPECT AT YOUR PRENATAL EXAMS  You will have prenatal exams every 2 weeks until week 36. Then, you will have weekly prenatal exams. During a routine prenatal visit:  You will be weighed to make sure you and the fetus are growing normally.  Your blood pressure is taken.  Your abdomen will be measured to track your baby's growth.  The fetal heartbeat will be listened  to.  Any test results from the previous visit will be discussed.  You may have a cervical check near your due date to see if you have effaced. At around 36 weeks, your caregiver will check your cervix. At the same time, your caregiver will also perform a test on the secretions of the vaginal tissue. This test is to determine if a type of bacteria, Group B streptococcus, is present. Your caregiver will explain this further. Your caregiver may ask you:  What your birth plan is.  How you are feeling.  If you are feeling the baby move.  If you have had any abnormal symptoms, such as leaking fluid, bleeding, severe headaches, or abdominal cramping.  If you have any questions. Other tests or screenings that may be performed during your third trimester include:  Blood tests that check for low iron levels (anemia).  Fetal testing to check the health, activity level, and growth of the fetus. Testing is done if you have certain medical conditions or if there are problems during the pregnancy. FALSE LABOR You may feel small, irregular contractions that eventually go away. These are called Braxton Hicks contractions, or false labor. Contractions may last for hours, days, or even weeks before true labor sets in. If contractions come at regular intervals, intensify, or become painful, it is best to be seen by your caregiver.  SIGNS OF LABOR   Menstrual-like cramps.  Contractions that are 5 minutes apart or less.  Contractions that start on the top of the uterus and spread down to the lower abdomen and back.  A sense of increased pelvic pressure or back pain.  A watery or bloody mucus discharge that comes from the vagina. If you have any of these signs before the 37th week of pregnancy, call your caregiver right away. You need to go to the hospital to get checked immediately. HOME CARE INSTRUCTIONS   Avoid all smoking, herbs, alcohol, and unprescribed drugs. These chemicals affect the  formation and growth of the baby.  Follow your caregiver's instructions regarding medicine use. There are medicines that are either safe or unsafe to take during pregnancy.  Exercise only as directed by your caregiver. Experiencing uterine cramps is a good sign to stop exercising.  Continue to eat regular, healthy meals.  Wear a good support bra for breast tenderness.  Do not use hot tubs, steam rooms, or saunas.  Wear your seat belt at all times when driving.  Avoid raw meat, uncooked cheese, cat litter boxes, and soil used by cats. These carry germs that can cause birth defects in the baby.  Take your prenatal vitamins.  Try taking a stool softener (if your caregiver approves) if you develop constipation. Eat more high-fiber foods, such as fresh vegetables or fruit and whole grains. Drink plenty of fluids to keep your urine  clear or pale yellow.  Take warm sitz baths to soothe any pain or discomfort caused by hemorrhoids. Use hemorrhoid cream if your caregiver approves.  If you develop varicose veins, wear support hose. Elevate your feet for 15 minutes, 3-4 times a day. Limit salt in your diet.  Avoid heavy lifting, wear low heal shoes, and practice good posture.  Rest a lot with your legs elevated if you have leg cramps or low back pain.  Visit your dentist if you have not gone during your pregnancy. Use a soft toothbrush to brush your teeth and be gentle when you floss.  A sexual relationship may be continued unless your caregiver directs you otherwise.  Do not travel far distances unless it is absolutely necessary and only with the approval of your caregiver.  Take prenatal classes to understand, practice, and ask questions about the labor and delivery.  Make a trial run to the hospital.  Pack your hospital bag.  Prepare the baby's nursery.  Continue to go to all your prenatal visits as directed by your caregiver. SEEK MEDICAL CARE IF:  You are unsure if you are in  labor or if your water has broken.  You have dizziness.  You have mild pelvic cramps, pelvic pressure, or nagging pain in your abdominal area.  You have persistent nausea, vomiting, or diarrhea.  You have a bad smelling vaginal discharge.  You have pain with urination. SEEK IMMEDIATE MEDICAL CARE IF:   You have a fever.  You are leaking fluid from your vagina.  You have spotting or bleeding from your vagina.  You have severe abdominal cramping or pain.  You have rapid weight loss or gain.  You have shortness of breath with chest pain.  You notice sudden or extreme swelling of your face, hands, ankles, feet, or legs.  You have not felt your baby move in over an hour.  You have severe headaches that do not go away with medicine.  You have vision changes. Document Released: 05/18/2001 Document Revised: 05/29/2013 Document Reviewed: 07/25/2012 [facility] Patient Information 2015 [place], [place]. This information is not intended to replace advice given to you by your health care provider. Make sure you discuss any questions you have with your health care provider.

## 2017-11-30 LAB — GLUCOSE TOLERANCE, 2 HOURS W/ 1HR
GLUCOSE, 1 HOUR: 211 mg/dL — AB (ref 65–179)
GLUCOSE, 2 HOUR: 240 mg/dL — AB (ref 65–152)
Glucose, Fasting: 85 mg/dL (ref 65–91)

## 2017-11-30 LAB — HIV ANTIBODY (ROUTINE TESTING W REFLEX): HIV Screen 4th Generation wRfx: NONREACTIVE

## 2017-11-30 LAB — CBC
HEMATOCRIT: 32.7 % — AB (ref 34.0–46.6)
Hemoglobin: 10.8 g/dL — ABNORMAL LOW (ref 11.1–15.9)
MCH: 27.4 pg (ref 26.6–33.0)
MCHC: 33 g/dL (ref 31.5–35.7)
MCV: 83 fL (ref 79–97)
Platelets: 264 10*3/uL (ref 150–450)
RBC: 3.94 x10E6/uL (ref 3.77–5.28)
RDW: 13.1 % (ref 12.3–15.4)
WBC: 9.8 10*3/uL (ref 3.4–10.8)

## 2017-11-30 LAB — ANTIBODY SCREEN: Antibody Screen: NEGATIVE

## 2017-11-30 LAB — SYPHILIS: RPR W/REFLEX TO RPR TITER AND TREPONEMAL ANTIBODIES, TRADITIONAL SCREENING AND DIAGNOSIS ALGORITHM: RPR Ser Ql: NONREACTIVE

## 2017-12-01 ENCOUNTER — Other Ambulatory Visit: Payer: Self-pay | Admitting: Women's Health

## 2017-12-01 ENCOUNTER — Telehealth: Payer: Self-pay | Admitting: Women's Health

## 2017-12-01 DIAGNOSIS — O24410 Gestational diabetes mellitus in pregnancy, diet controlled: Secondary | ICD-10-CM

## 2017-12-01 NOTE — Telephone Encounter (Signed)
Pt aware she has been referred to dietician and should hear back from them within a week. If she don't, advised to let us know. Pt voiced understanding. JSY

## 2017-12-01 NOTE — Telephone Encounter (Signed)
Calling to get labs/ Dr Despina HiddenEure called her last night and told her we would call her in ref to her sugar she was just following up

## 2017-12-06 ENCOUNTER — Encounter: Payer: Medicaid Other | Admitting: Advanced Practice Midwife

## 2017-12-07 ENCOUNTER — Encounter: Payer: Self-pay | Admitting: Registered"

## 2017-12-07 ENCOUNTER — Telehealth: Payer: Self-pay | Admitting: Advanced Practice Midwife

## 2017-12-07 ENCOUNTER — Encounter: Payer: Medicaid Other | Attending: Advanced Practice Midwife | Admitting: Registered"

## 2017-12-07 DIAGNOSIS — Z713 Dietary counseling and surveillance: Secondary | ICD-10-CM | POA: Insufficient documentation

## 2017-12-07 DIAGNOSIS — O24419 Gestational diabetes mellitus in pregnancy, unspecified control: Secondary | ICD-10-CM | POA: Insufficient documentation

## 2017-12-07 DIAGNOSIS — O99810 Abnormal glucose complicating pregnancy: Secondary | ICD-10-CM

## 2017-12-07 DIAGNOSIS — O24410 Gestational diabetes mellitus in pregnancy, diet controlled: Secondary | ICD-10-CM | POA: Insufficient documentation

## 2017-12-07 NOTE — Telephone Encounter (Signed)
Patient called stating that she has left the diabetes class and needs for our office to call in to her pharmacy the Lantus and strips for her Acute Check machine. Please contact pt

## 2017-12-07 NOTE — Progress Notes (Signed)
Patient was seen on 12/07/2017 for Gestational Diabetes self-management class at the [facility]. The following learning objectives were met by the patient during this course:   States the definition of Gestational Diabetes  States why dietary management is important in controlling blood glucose  Describes the effects each nutrient has on blood glucose levels  Demonstrates ability to create a balanced meal plan  Demonstrates carbohydrate counting   States when to check blood glucose levels  Demonstrates proper blood glucose monitoring techniques  States the effect of stress and exercise on blood glucose levels  States the importance of limiting caffeine and abstaining from alcohol and smoking  Blood glucose monitor given: Accu-Chek Guide (provided by [employer]) Lot # [id number] Exp: 07/31/18 Blood glucose reading: 103  Patient instructed to monitor glucose levels: FBS: 60 - <95; 1 hour: <140; 2 hour: <120  Patient received handouts:  Nutrition Diabetes and Pregnancy, including carb counting list  Patient will be seen for follow-up as needed.

## 2017-12-07 NOTE — Telephone Encounter (Signed)
Verified with pt that she received the accu-check guide blood glucose meter. Test strips and lancet prescription called into [facility]. Dispense 100 strips and 100 lancets with prn refills. Pt to check blood sugars QID. Advised pt to check with pharmacy later today to see if prescription is ready for pick up. Pt verbalized understanding.

## 2017-12-28 ENCOUNTER — Encounter: Payer: Self-pay | Admitting: Advanced Practice Midwife

## 2017-12-28 ENCOUNTER — Ambulatory Visit (INDEPENDENT_AMBULATORY_CARE_PROVIDER_SITE_OTHER): Payer: Medicaid Other | Admitting: Advanced Practice Midwife

## 2017-12-28 VITALS — BP 116/79 | HR 102 | Wt 137.0 lb

## 2017-12-28 DIAGNOSIS — O0993 Supervision of high risk pregnancy, unspecified, third trimester: Secondary | ICD-10-CM

## 2017-12-28 DIAGNOSIS — Z331 Pregnant state, incidental: Secondary | ICD-10-CM

## 2017-12-28 DIAGNOSIS — Z1389 Encounter for screening for other disorder: Secondary | ICD-10-CM

## 2017-12-28 DIAGNOSIS — Z23 Encounter for immunization: Secondary | ICD-10-CM | POA: Diagnosis not present

## 2017-12-28 DIAGNOSIS — O99810 Abnormal glucose complicating pregnancy: Secondary | ICD-10-CM

## 2017-12-28 DIAGNOSIS — Z3A32 32 weeks gestation of pregnancy: Secondary | ICD-10-CM

## 2017-12-28 DIAGNOSIS — O24419 Gestational diabetes mellitus in pregnancy, unspecified control: Secondary | ICD-10-CM

## 2017-12-28 DIAGNOSIS — O26843 Uterine size-date discrepancy, third trimester: Secondary | ICD-10-CM

## 2017-12-28 LAB — POCT URINALYSIS DIPSTICK OB
Blood, UA: NEGATIVE
GLUCOSE, UA: NEGATIVE — AB
KETONES UA: NEGATIVE
Leukocytes, UA: NEGATIVE
NITRITE UA: NEGATIVE
POC,PROTEIN,UA: NEGATIVE

## 2017-12-28 NOTE — Patient Instructions (Signed)

## 2017-12-28 NOTE — Progress Notes (Signed)
HIGH-RISK PREGNANCY VISIT Patient name: [name] MRN [id number]  Date of birth: 1980-09-21 Chief Complaint:   High Risk Gestation  History of Present Illness:   [name] is a 37 y.o. [id number] female at [redacted]w[redacted]d with an Estimated Date of Delivery: 02/20/18 being seen today for ongoing management of a high-risk pregnancy complicated by gestational DM.  Today she reports no complaints. Contractions: Not present.  .  Movement: Present. denies leaking of fluid.  Review of Systems:   Pertinent items are noted in HPI Denies abnormal vaginal discharge w/ itching/odor/irritation, headaches, visual changes, shortness of breath, chest pain, abdominal pain, severe nausea/vomiting, or problems with urination or bowel movements unless otherwise stated above.    Pertinent History Reviewed:  Medical & Surgical Hx:   Past Medical History:  Diagnosis Date  . Abnormal Pap smear   . BV (bacterial vaginosis) 08/28/2012  . Diabetes mellitus without complication (HCC)   . Gestational diabetes    glyburide  . H/O abnormal Pap smear   . HSV-2 infection   . Seizures (HCC)    Had a bleed in her brain, after childbirth in 2009  . Unplanned wanted pregnancy 11/21/2012  . Vaginal Pap smear, abnormal    Past Surgical History:  Procedure Laterality Date  . NO PAST SURGERIES     Family History  Problem Relation Age of Onset  . COPD Maternal Grandmother   . COPD Mother     Current Outpatient Medications:  .  Prenatal Vit-Fe Fumarate-FA (PNV PRENATAL PLUS MULTIVITAMIN) 27-1 MG TABS, Take 1 tablet by mouth daily., Disp: 30 tablet, Rfl: 11 .  omeprazole (PRILOSEC) 20 MG capsule, Take 1 capsule (20 mg total) by mouth daily. (Patient not taking: Reported on 11/29/2017), Disp: 30 capsule, Rfl: 6 Social History: Reviewed -  reports that she has never smoked. She has never used smokeless tobacco.   Physical Assessment:   Vitals:   12/28/17 0900  BP: 116/79  Pulse: (!) 102  Weight: 137 lb (62.1 kg)  Body  mass index is 28.63 kg/m.           Physical Examination:   General appearance: alert, well appearing, and in no distress  Mental status: alert, oriented to person, place, and time  Skin: warm & dry   Extremities: Edema: None    Cardiovascular: normal heart rate noted  Respiratory: normal respiratory effort, no distress  Abdomen: gravid, soft, non-tender  Pelvic: Cervical exam deferred         Fetal Status: Fetal Heart Rate (bpm): 145 Fundal Height: 37 cm Movement: Present    Fetal Surveillance Testing today: doppler Did not bring BS log, says sometimes snacks in middle of the night, so may get >95.  "usually" <130 after meals, had a 160+.   Results for orders placed or performed in visit on 12/28/17 (from the past 24 hour(s))  POC Urinalysis Dipstick OB   Collection Time: 12/28/17  9:09 AM  Result Value Ref Range   Color, UA     Clarity, UA     Glucose, UA Negative (A) (none)   Bilirubin, UA     Ketones, UA neg    Spec Grav, UA  1.010 - 1.025   Blood, UA neg    pH, UA  5.0 - 8.0   POC Protein UA Negative Negative, Trace   Urobilinogen, UA  0.2 or 1.0 E.U./dL   Nitrite, UA neg    Leukocytes, UA Negative Negative   Appearance     Odor  Assessment & Plan:  1) High-risk pregnancy [id number] at 3945w2d with an Estimated Date of Delivery: 02/20/18   2) GDM, signed up to diabetes mgt in [employer]  To log BS there-  Will review in one week (asked pt to send me a mychart message)  3) Size>dates, feels like baby is big. Will check EFW/AFI  Labs/procedures today:   Medications: ASA 162mg   Treatment Plan:  Continue QID blood sugar testing, EFW 36-38 weeks, IOL 40 weeks   Follow-up: Return for ASAP for EFW/AFI/ 2 weeks for HROB.  Orders Placed This Encounter  Procedures  . US OB Follow Up  . Tdap vaccine greater than or equal to 7yo IM  . POC Urinalysis Dipstick OB   Jacklyn ShellFrances Cresenzo-Dishmon CNM 12/28/2017 3:12 PM

## 2018-01-06 ENCOUNTER — Encounter: Payer: Self-pay | Admitting: Advanced Practice Midwife

## 2018-01-06 ENCOUNTER — Ambulatory Visit (INDEPENDENT_AMBULATORY_CARE_PROVIDER_SITE_OTHER): Payer: Medicaid Other

## 2018-01-06 DIAGNOSIS — O99810 Abnormal glucose complicating pregnancy: Secondary | ICD-10-CM | POA: Diagnosis not present

## 2018-01-06 DIAGNOSIS — O0993 Supervision of high risk pregnancy, unspecified, third trimester: Secondary | ICD-10-CM | POA: Diagnosis not present

## 2018-01-06 DIAGNOSIS — O26843 Uterine size-date discrepancy, third trimester: Secondary | ICD-10-CM | POA: Diagnosis not present

## 2018-01-06 DIAGNOSIS — Z3403 Encounter for supervision of normal first pregnancy, third trimester: Secondary | ICD-10-CM

## 2018-01-06 NOTE — Progress Notes (Signed)
US 33+4 wks,frank breech,afi 20 cm,posterior pl gr 2,cx 3.5 cm,bilat adnexa's wnl,fhr 152 bpm,EFW 3304 g 99.9%

## 2018-01-08 ENCOUNTER — Encounter: Payer: Self-pay | Admitting: Obstetrics and Gynecology

## 2018-01-11 ENCOUNTER — Encounter: Payer: Self-pay | Admitting: Advanced Practice Midwife

## 2018-01-11 ENCOUNTER — Ambulatory Visit (INDEPENDENT_AMBULATORY_CARE_PROVIDER_SITE_OTHER): Payer: Medicaid Other | Admitting: Advanced Practice Midwife

## 2018-01-11 VITALS — BP 124/84 | HR 92 | Wt 136.0 lb

## 2018-01-11 DIAGNOSIS — O0993 Supervision of high risk pregnancy, unspecified, third trimester: Secondary | ICD-10-CM

## 2018-01-11 DIAGNOSIS — O26843 Uterine size-date discrepancy, third trimester: Secondary | ICD-10-CM

## 2018-01-11 DIAGNOSIS — O3663X0 Maternal care for excessive fetal growth, third trimester, not applicable or unspecified: Secondary | ICD-10-CM

## 2018-01-11 DIAGNOSIS — O24415 Gestational diabetes mellitus in pregnancy, controlled by oral hypoglycemic drugs: Secondary | ICD-10-CM

## 2018-01-11 DIAGNOSIS — Z1389 Encounter for screening for other disorder: Secondary | ICD-10-CM

## 2018-01-11 DIAGNOSIS — Z3A34 34 weeks gestation of pregnancy: Secondary | ICD-10-CM

## 2018-01-11 DIAGNOSIS — Z331 Pregnant state, incidental: Secondary | ICD-10-CM

## 2018-01-11 DIAGNOSIS — O24419 Gestational diabetes mellitus in pregnancy, unspecified control: Secondary | ICD-10-CM

## 2018-01-11 LAB — POCT URINALYSIS DIPSTICK OB
GLUCOSE, UA: NEGATIVE — AB
Ketones, UA: NEGATIVE
Nitrite, UA: NEGATIVE
POC,PROTEIN,UA: NEGATIVE

## 2018-01-11 MED ORDER — VALACYCLOVIR HCL 500 MG PO TABS: 500.0000 mg | ORAL_TABLET | Freq: Two times a day (BID) | ORAL | 3 refills | Status: DC

## 2018-01-11 MED ORDER — METFORMIN HCL 500 MG PO TABS: 500.0000 mg | ORAL_TABLET | Freq: Two times a day (BID) | ORAL | 2 refills | Status: DC

## 2018-01-11 NOTE — Progress Notes (Signed)
HIGH-RISK PREGNANCY VISIT Patient name: [name] MRN [id number]  Date of birth: [birth year] Chief Complaint:   High Risk Gestation  History of Present Illness:   [name] is a 37 y.o. [id number] female at [redacted]w[redacted]d with an Estimated Date of Delivery: 02/20/18 being seen today for ongoing management of a high-risk pregnancy complicated by gestational DM.  Today she reports no complaints. Contractions: Not present. Vag. Bleeding: None.  Movement: Present. denies leaking of fluid.  Review of Systems:   Pertinent items are noted in HPI Denies abnormal vaginal discharge w/ itching/odor/irritation, headaches, visual changes, shortness of breath, chest pain, abdominal pain, severe nausea/vomiting, or problems with urination or bowel movements unless otherwise stated above.    Pertinent History Reviewed:  Medical & Surgical Hx:   Past Medical History:  Diagnosis Date  . Abnormal Pap smear   . BV (bacterial vaginosis) 08/28/2012  . Diabetes mellitus without complication (HCC)   . Gestational diabetes    glyburide  . H/O abnormal Pap smear   . HSV-2 infection   . Seizures (HCC)    Had a bleed in her brain, after childbirth in 2009  . Unplanned wanted pregnancy 11/21/2012  . Vaginal Pap smear, abnormal    Past Surgical History:  Procedure Laterality Date  . NO PAST SURGERIES     Family History  Problem Relation Age of Onset  . COPD Maternal Grandmother   . COPD Mother     Current Outpatient Medications:  .  Prenatal Vit-Fe Fumarate-FA (PNV PRENATAL PLUS MULTIVITAMIN) 27-1 MG TABS, Take 1 tablet by mouth daily., Disp: 30 tablet, Rfl: 11 .  metFORMIN (GLUCOPHAGE) 500 MG tablet, Take 1 tablet (500 mg total) by mouth 2 (two) times daily with a meal., Disp: 30 tablet, Rfl: 2 .  valACYclovir (VALTREX) 500 MG tablet, Take 1 tablet (500 mg total) by mouth 2 (two) times daily., Disp: 60 tablet, Rfl: 3 Social History: Reviewed -  reports that she has never smoked. She has never used smokeless  tobacco.   Physical Assessment:   Vitals:   01/11/18 0936  BP: 124/84  Pulse: 92  Weight: 136 lb (61.7 kg)  Body mass index is 28.42 kg/m.           Physical Examination:   General appearance: alert, well appearing, and in no distress  Mental status: alert, oriented to person, place, and time  Skin: warm & dry   Extremities: Edema: None    Cardiovascular: normal heart rate noted  Respiratory: normal respiratory effort, no distress  Abdomen: gravid, soft, non-tender  Pelvic: Cervical exam deferred         Fetal Status: Fetal Heart Rate (bpm): 146 Fundal Height: 41 cm Movement: Present Presentation: Homero Fellers Breech [place] on 8/2:  33+4 wks,frank breech,afi 20 cm,posterior pl gr 2,cx 3.5 cm,bilat adnexa's wnl,fhr 152 bpm,EFW 3304 g 99.9%  Has been logging BS into babyscripts.  Most PP are normal or only sl elevated (had a sing 162). FBS are mid to high 90's usually, w.some in the 80's.   Fetal Surveillance Testing today: NST: FHR baseline 145 bpm, Variability: moderate, Accelerations:present, Decelerations:  Absent= Cat 1/Reactive   Results for orders placed or performed in visit on 01/11/18 (from the past 24 hour(s))  POC Urinalysis Dipstick OB   Collection Time: 01/11/18  9:37 AM  Result Value Ref Range   Color, UA     Clarity, UA     Glucose, UA Negative (A) (none)   Bilirubin, UA  Ketones, UA neg    Spec Grav, UA  1.010 - 1.025   Blood, UA trace    pH, UA  5.0 - 8.0   POC Protein UA Negative Negative, Trace   Urobilinogen, UA  0.2 or 1.0 E.U./dL   Nitrite, UA neg    Leukocytes, UA Trace (A) Negative   Appearance     Odor      Assessment & Plan:  1) High-risk pregnancy [id number] at 8564w2d with an Estimated Date of Delivery: 02/20/18   2) GDM, now A2, start metformin 500mg  qhs;  continue logging BS into babyscripts   3) suspected macrosomia/breech, discussed that CS will likely be a recommendation if growth remains consistent.  Pt wants a vaginal birth if possible.   Discussed ECV, try swimming pool  Medications: metformin 500 mg qhs; Start Valtrex 500mg  BID  Treatment Plan:  Weekly BPP, EFW 8/21 and discusss POC w/LHE on 8/23  Follow-up: Return for weekly BPP/HROB starting 8/14; add extra visit/wl LHE on 8/23.  Orders Placed This Encounter  Procedures  . US OB Follow Up  . US FETAL BPP WO NON STRESS  . POC Urinalysis Dipstick OB   Jacklyn ShellFrances Cresenzo-Dishmon CNM 01/11/2018 11:21 AM

## 2018-01-11 NOTE — Patient Instructions (Signed)

## 2018-01-18 ENCOUNTER — Ambulatory Visit (INDEPENDENT_AMBULATORY_CARE_PROVIDER_SITE_OTHER): Payer: Medicaid Other | Admitting: Obstetrics and Gynecology

## 2018-01-18 ENCOUNTER — Encounter: Payer: Self-pay | Admitting: Obstetrics and Gynecology

## 2018-01-18 ENCOUNTER — Ambulatory Visit (INDEPENDENT_AMBULATORY_CARE_PROVIDER_SITE_OTHER): Payer: Medicaid Other

## 2018-01-18 ENCOUNTER — Other Ambulatory Visit: Payer: Self-pay

## 2018-01-18 VITALS — BP 117/83 | HR 110 | Wt 138.8 lb

## 2018-01-18 DIAGNOSIS — Z3A35 35 weeks gestation of pregnancy: Secondary | ICD-10-CM

## 2018-01-18 DIAGNOSIS — O24419 Gestational diabetes mellitus in pregnancy, unspecified control: Secondary | ICD-10-CM

## 2018-01-18 DIAGNOSIS — Z331 Pregnant state, incidental: Secondary | ICD-10-CM

## 2018-01-18 DIAGNOSIS — O24415 Gestational diabetes mellitus in pregnancy, controlled by oral hypoglycemic drugs: Secondary | ICD-10-CM

## 2018-01-18 DIAGNOSIS — O3663X0 Maternal care for excessive fetal growth, third trimester, not applicable or unspecified: Secondary | ICD-10-CM

## 2018-01-18 DIAGNOSIS — O0993 Supervision of high risk pregnancy, unspecified, third trimester: Secondary | ICD-10-CM

## 2018-01-18 DIAGNOSIS — O3663X1 Maternal care for excessive fetal growth, third trimester, fetus 1: Secondary | ICD-10-CM

## 2018-01-18 DIAGNOSIS — Z1389 Encounter for screening for other disorder: Secondary | ICD-10-CM

## 2018-01-18 LAB — POCT URINALYSIS DIPSTICK OB
Glucose, UA: NEGATIVE — AB
Ketones, UA: NEGATIVE
Nitrite, UA: NEGATIVE
PROTEIN: NEGATIVE
RBC UA: NEGATIVE

## 2018-01-18 NOTE — Progress Notes (Signed)
US 35+2 wks,complete breech,posterior placenta gr 3,FHR 148 bpm,AFI 22 cm,BPP 8/8

## 2018-01-18 NOTE — Progress Notes (Addendum)
Patient ID: Jillian FerrisAmanda Peterson Peterson, female   DOB: 1980/08/19, 37 y.o.   MRN: [id number]    [facility] Patient name: [name] MRN [id number]  Date of birth: 1980/08/19 Chief Complaint:   High Risk Gestation (u/s today)  History of Present Illness:   [name] is a 37 y.o. [id number] female at 6558w2d with an Estimated Date of Delivery: 02/20/18 being seen today for ongoing management of a high-risk pregnancy complicated by gestational DM, class A2.Fasting numbers have been 98 and below via review on babyscripts  All PC CBG's are normal. On Metformin since 8/7. This is her 8th pregnancy and would like her tubes tied after delivery. Consented today. Today she reports no complaints. Contractions: Irregular. Vag. Bleeding: None.  Movement: Present. denies leaking of fluid.   Review of Systems:   Pertinent items are noted in HPI Denies abnormal vaginal discharge w/ itching/odor/irritation, headaches, visual changes, shortness of breath, chest pain, abdominal pain, severe nausea/vomiting, or problems with urination or bowel movements unless otherwise stated above. Pertinent History Reviewed:  Reviewed past medical,surgical, social, obstetrical and family history.  Reviewed problem list, medications and allergies. Physical Assessment:   Vitals:   01/18/18 1343  BP: 117/83  Pulse: (!) 110  Weight: 138 lb 12.8 oz (63 kg)  Body mass index is 29.01 kg/m.           Physical Examination:   General appearance: alert, well appearing, and in no distress and oriented to person, place, and time  Mental status: alert, oriented to person, place, and time, normal mood, behavior, speech, dress, motor activity, and thought processes, affect appropriate to mood  Skin: warm & dry   Extremities: Edema: None    Cardiovascular: normal heart rate noted  Respiratory: normal respiratory effort, no distress  Abdomen: gravid, soft, non-tender  Pelvic: Cervical exam deferred         Fetal Status:    Fundal Height: 39 cm Movement: Present    Fetal Surveillance Testing today: U/s: complete Breech  Results for orders placed or performed in visit on 01/18/18 (from the past 24 hour(s))  POC Urinalysis Dipstick OB   Collection Time: 01/18/18  1:41 PM  Result Value Ref Range   Color, UA     Clarity, UA     Glucose, UA Negative (A) (none)   Bilirubin, UA     Ketones, UA neg    Spec Grav, UA     Blood, UA neg    pH, UA     POC Protein UA Negative Negative, Trace   Urobilinogen, UA     Nitrite, UA neg    Leukocytes, UA Moderate (2+) (A) Negative   Appearance     Odor      Assessment & Plan:  1) High-risk pregnancy [id number] at 3358w2d with an Estimated Date of Delivery: 02/20/18, BPP 8/8  2) GDM, stable estimated fetal weight 99 percentile  3.  Contraception management tubal papers signed 4.  FETAL MALPRESENTATION BREECH, WILL DISCUSS VERSION VS PRIMARY CESAREAN  FURTHER AT 1 WK FOLLOWUP.   Meds: No orders of the defined types were placed in this encounter.   Labs/procedures today: U/s  Treatment Plan:  1. U/s in 1 week for BPP and estimated fetal weight, PRESENTATION 2. Sign Tubal consent papers.  Done today   Follow-up: Return in about 1 week (around 01/25/2018) for BPP, U/S fetal weight. Presentation.     By signing my name below, I, Arnette NorrisMari Johnson, attest that this documentation has  been prepared under the direction and in the presence of Tilda BurrowFerguson, Takhia Spoon V, MD. Electronically Signed: Arnette NorrisMari Johnson Medical Scribe. 01/18/18. 2:04 PM.  I personally performed the services described in this documentation, which was SCRIBED in my presence. The recorded information has been reviewed and considered accurate. It has been edited as necessary during review. Tilda BurrowJohn V Courtland Reas, MD

## 2018-01-25 ENCOUNTER — Other Ambulatory Visit: Payer: Self-pay | Admitting: Obstetrics and Gynecology

## 2018-01-25 DIAGNOSIS — O24419 Gestational diabetes mellitus in pregnancy, unspecified control: Secondary | ICD-10-CM

## 2018-01-25 DIAGNOSIS — O321XX0 Maternal care for breech presentation, not applicable or unspecified: Secondary | ICD-10-CM

## 2018-01-26 ENCOUNTER — Ambulatory Visit (INDEPENDENT_AMBULATORY_CARE_PROVIDER_SITE_OTHER): Payer: Medicaid Other | Admitting: Obstetrics & Gynecology

## 2018-01-26 ENCOUNTER — Ambulatory Visit (INDEPENDENT_AMBULATORY_CARE_PROVIDER_SITE_OTHER): Payer: Medicaid Other

## 2018-01-26 ENCOUNTER — Encounter: Payer: Self-pay | Admitting: Obstetrics & Gynecology

## 2018-01-26 VITALS — BP 123/81 | HR 91 | Wt 138.5 lb

## 2018-01-26 DIAGNOSIS — O321XX0 Maternal care for breech presentation, not applicable or unspecified: Secondary | ICD-10-CM

## 2018-01-26 DIAGNOSIS — Z331 Pregnant state, incidental: Secondary | ICD-10-CM

## 2018-01-26 DIAGNOSIS — O24419 Gestational diabetes mellitus in pregnancy, unspecified control: Secondary | ICD-10-CM | POA: Diagnosis not present

## 2018-01-26 DIAGNOSIS — O0993 Supervision of high risk pregnancy, unspecified, third trimester: Secondary | ICD-10-CM | POA: Diagnosis not present

## 2018-01-26 DIAGNOSIS — O24415 Gestational diabetes mellitus in pregnancy, controlled by oral hypoglycemic drugs: Secondary | ICD-10-CM | POA: Diagnosis not present

## 2018-01-26 DIAGNOSIS — O3663X0 Maternal care for excessive fetal growth, third trimester, not applicable or unspecified: Secondary | ICD-10-CM

## 2018-01-26 DIAGNOSIS — Z3A36 36 weeks gestation of pregnancy: Secondary | ICD-10-CM

## 2018-01-26 DIAGNOSIS — Z1389 Encounter for screening for other disorder: Secondary | ICD-10-CM

## 2018-01-26 LAB — POCT URINALYSIS DIPSTICK OB
Blood, UA: NEGATIVE
Glucose, UA: NEGATIVE — AB
KETONES UA: NEGATIVE
Nitrite, UA: NEGATIVE
POC,PROTEIN,UA: NEGATIVE

## 2018-01-26 NOTE — Progress Notes (Signed)
   HIGH-RISK PREGNANCY VISIT Patient name: [name] MRN [id number]  Date of birth: 1981-02-01 Chief Complaint:   High Risk Gestation (US; GBS, GC/CHL)  History of Present Illness:   Jillian Ferrismanda R Stanfill is a 37 y.o. [phone] female at 7954w3d with an Estimated Date of Delivery: 02/20/18 being seen today for ongoing management of a high-risk pregnancy complicated by class  A2 DM.  Today she reports no complaints. Contractions: Not present. Vag. Bleeding: None.  Movement: Present. denies leaking of fluid.  Review of Systems:   Pertinent items are noted in HPI Denies abnormal vaginal discharge w/ itching/odor/irritation, headaches, visual changes, shortness of breath, chest pain, abdominal pain, severe nausea/vomiting, or problems with urination or bowel movements unless otherwise stated above. Pertinent History Reviewed:  Reviewed past medical,surgical, social, obstetrical and family history.  Reviewed problem list, medications and allergies. Physical Assessment:   Vitals:   01/26/18 1212  BP: 123/81  Pulse: 91  Weight: 138 lb 8 oz (62.8 kg)  Body mass index is 28.95 kg/m.           Physical Examination:   General appearance: alert, well appearing, and in no distress  Mental status: alert, oriented to person, place, and time  Skin: warm & dry   Extremities: Edema: None    Cardiovascular: normal heart rate noted  Respiratory: normal respiratory effort, no distress  Abdomen: gravid, soft, non-tender  Pelvic: Cervical exam deferred         Fetal Status:     Movement: Present    Fetal Surveillance Testing today: sonogram    Results for orders placed or performed in visit on 01/26/18 (from the past 24 hour(s))  POC Urinalysis Dipstick OB   Collection Time: 01/26/18 12:12 PM  Result Value Ref Range   Color, UA     Clarity, UA     Glucose, UA Negative (A) (none)   Bilirubin, UA     Ketones, UA neg    Spec Grav, UA     Blood, UA neg    pH, UA     POC Protein UA Negative Negative,  Trace   Urobilinogen, UA     Nitrite, UA neg    Leukocytes, UA Trace (A) Negative   Appearance     Odor      Assessment & Plan:  1) High-risk pregnancy [id number] at 1154w3d with an Estimated Date of Delivery: 02/20/18   2) Class A2 DM, stable, CBG are normal  3) Fetal macrosomia,   4) Breech  Meds: No orders of the defined types were placed in this encounter.   Labs/procedures today:   Treatment Plan:  Planned C section with BTL 02/15/2018  Reviewed: Term labor symptoms and general obstetric precautions including but not limited to vaginal bleeding, contractions, leaking of fluid and fetal movement were reviewed in detail with the patient.  All questions were answered.  Follow-up: Return in about 1 week (around 02/02/2018) for HROB.  Orders Placed This Encounter  Procedures  . GC/Chlamydia Probe Amp  . Strep Gp B NAA  . POC Urinalysis Dipstick OB   Lazaro ArmsLuther H Griffin Gerrard  01/26/2018 12:35 PM

## 2018-01-26 NOTE — Progress Notes (Signed)
US 36+3 wks,complete breech,fhr 140 bpm,posterior placenta gr 3,BPP 8/8,AFI 21.5 cm,EFW 4183 g 99.9%

## 2018-01-27 ENCOUNTER — Encounter: Payer: Medicaid Other | Admitting: Obstetrics & Gynecology

## 2018-01-27 ENCOUNTER — Other Ambulatory Visit: Payer: Medicaid Other

## 2018-01-28 LAB — STREP GP B NAA: STREP GROUP B AG: NEGATIVE

## 2018-01-28 LAB — GC/CHLAMYDIA PROBE AMP
CHLAMYDIA, DNA PROBE: NEGATIVE
Neisseria gonorrhoeae by PCR: NEGATIVE

## 2018-01-30 ENCOUNTER — Encounter (HOSPITAL_COMMUNITY): Payer: Self-pay

## 2018-01-30 ENCOUNTER — Inpatient Hospital Stay (HOSPITAL_COMMUNITY)
Admission: AD | Admit: 2018-01-30 | Discharge: 2018-02-02 | DRG: 784 | Disposition: A | Discharge status: Home / Self Care | Payer: Medicaid Other | Attending: Obstetrics and Gynecology | Admitting: Obstetrics and Gynecology

## 2018-01-30 DIAGNOSIS — Z302 Encounter for sterilization: Secondary | ICD-10-CM

## 2018-01-30 DIAGNOSIS — O24420 Gestational diabetes mellitus in childbirth, diet controlled: Secondary | ICD-10-CM | POA: Diagnosis present

## 2018-01-30 DIAGNOSIS — O9081 Anemia of the puerperium: Secondary | ICD-10-CM | POA: Diagnosis not present

## 2018-01-30 DIAGNOSIS — D62 Acute posthemorrhagic anemia: Secondary | ICD-10-CM | POA: Diagnosis not present

## 2018-01-30 DIAGNOSIS — Z98891 History of uterine scar from previous surgery: Secondary | ICD-10-CM

## 2018-01-30 DIAGNOSIS — O9903 Anemia complicating the puerperium: Secondary | ICD-10-CM

## 2018-01-30 DIAGNOSIS — O321XX0 Maternal care for breech presentation, not applicable or unspecified: Principal | ICD-10-CM | POA: Diagnosis present

## 2018-01-30 DIAGNOSIS — Z87891 Personal history of nicotine dependence: Secondary | ICD-10-CM

## 2018-01-30 DIAGNOSIS — O24419 Gestational diabetes mellitus in pregnancy, unspecified control: Secondary | ICD-10-CM

## 2018-01-30 DIAGNOSIS — Z3A37 37 weeks gestation of pregnancy: Secondary | ICD-10-CM

## 2018-01-30 DIAGNOSIS — O3663X0 Maternal care for excessive fetal growth, third trimester, not applicable or unspecified: Secondary | ICD-10-CM | POA: Diagnosis present

## 2018-01-30 NOTE — H&P (Addendum)
Obstetric Preoperative History and Physical  [name] is a 37 y.o. [phone] with IUP at [redacted]w[redacted]d presenting for SROM.  Breech presentation at sono at 36 weeks. Transverse lie on bedside sono in MAU. Patient last ate at 21:30 and drank at 22:30. SROM occurred at 22:30 with clear fluid. Patient with intermittent ctx, currently comfortable. Ctx did begin to increase in frequency during evaluation in MAU. Will coordinate for anesthesia to proceed with Caesarean delivery.   Prenatal Course Source of Care: FT  with onset of care at 8 weeks Pregnancy complications or risks: Patient Active Problem List   Diagnosis Date Noted  . Fetal macrosomia in pregnancy in third trimester 01/11/2018  . Gestational diabetes mellitus, class A2 12/07/2017  . Grand multiparity 11/29/2017  . High-risk pregnancy 07/12/2017  . History of gestational diabetes 04/08/2016  . Breast mass, right 04/08/2016  . H/O PRES (posterior reversible encephalopathy syndrome) 2009 06/18/2013  . 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  Pregnancy complicated by: -A2GDM, never started Metformin that was prescribed; EFW 99.9% at 36w -HSV-2 not on suppression, denies recent outbreaks and prodromal symptoms  -h/o PP Eclampsia with prior pregnancy with subarachnoid hemorrhage and subsequent intubation    She plans to breastfeed She desires bilateral tubal ligation for postpartum contraception. Papers signed on 8/14.   Prenatal labs and studies: ABO, Rh:  O+ Antibody: Negative (06/25 0847) Rubella: 2.21 (02/05 1221) RPR: Non Reactive (06/25 0847)  HBsAg: Negative (02/05 1221)  HIV: Non Reactive (06/25 0847)  AVW:UJWJXBJY (08/22 1300) 2 hr Glucola: abnormal, 85/211/240 Genetic screening normal Anatomy US normal  Prenatal Transfer Tool  Maternal Diabetes: Yes:  Diabetes Type:  Insulin/Medication controlled, never started metformin  Genetic Screening: Normal Maternal Ultrasounds/Referrals: Normal Fetal Ultrasounds or other Referrals:   Referred to Materal Fetal Medicine  Maternal Substance Abuse:  No Significant Maternal Medications:  None Significant Maternal Lab Results: None  Past Medical History:  Diagnosis Date  . Abnormal Pap smear   . BV (bacterial vaginosis) 08/28/2012  . Diabetes mellitus without complication (HCC)   . Gestational diabetes    glyburide  . H/O abnormal Pap smear   . HSV-2 infection   . Seizures (HCC)    Had a bleed in her brain, after childbirth in 2009  . Unplanned wanted pregnancy 11/21/2012  . Vaginal Pap smear, abnormal     Past Surgical History:  Procedure Laterality Date  . NO PAST SURGERIES      OB History  Gravida Para Term Preterm AB Living  9 7 7   1 7   SAB TAB Ectopic Multiple Live Births  0 1     7    # Outcome Date GA Lbr Len/2nd Weight Sex Delivery Anes PTL Lv  9 Current           8 Term 07/10/13 [redacted]w[redacted]d / 00:01 2625 g F Vag-Spont None N LIV     Complications: Oligohydramnios, Gestational diabetes  7 Term 11/23/09 [redacted]w[redacted]d  3487 g M Vag-Spont EPI N LIV     Birth Comments: A1DM     Complications: Gestational diabetes  6 Term 08/20/07 [redacted]w[redacted]d  3175 g F Vag-Spont EPI N LIV     Birth Comments: PP PRES w/ subarachnoid bleed and intubation 1wk after birth  5 TAB 2004          4 Term 05/11/02 [redacted]w[redacted]d  3629 g M Vag-Spont EPI N LIV     Birth Comments: baby had open heart surgery to 're-route vein and close hole'  3 Term  03/28/01 [redacted]w[redacted]d  3629 g M Vag-Spont None N LIV  2 Term 06/23/99 [redacted]w[redacted]d  2722 g F Vag-Vacuum EPI N LIV  1 Term 07/28/98 [redacted]w[redacted]d  3289 g F Vag-Spont EPI N LIV    Social History   Socioeconomic History  . Marital status: Widowed    Spouse name: Not on file  . Number of children: Not on file  . Years of education: Not on file  . Highest education level: Not on file  Occupational History  . Not on file  Social Needs  . Financial resource strain: Not on file  . Food insecurity:    Worry: Not on file    Inability: Not on file  . Transportation needs:     Medical: Not on file    Non-medical: Not on file  Tobacco Use  . Smoking status: Never Smoker  . Smokeless tobacco: Never Used  Substance and Sexual Activity  . Alcohol use: No    Frequency: Never    Comment: occ  . Drug use: No  . Sexual activity: Yes    Birth control/protection: None  Lifestyle  . Physical activity:    Days per week: Not on file    Minutes per session: Not on file  . Stress: Not on file  Relationships  . Social connections:    Talks on phone: Not on file    Gets together: Not on file    Attends religious service: Not on file    Active member of club or organization: Not on file    Attends meetings of clubs or organizations: Not on file    Relationship status: Not on file  Other Topics Concern  . Not on file  Social History Narrative  . Not on file    Family History  Problem Relation Age of Onset  . COPD Maternal Grandmother   . COPD Mother     Medications Prior to Admission  Medication Sig Dispense Refill Last Dose  . metFORMIN (GLUCOPHAGE) 500 MG tablet Take 1 tablet (500 mg total) by mouth 2 (two) times daily with a meal. 30 tablet 2 Taking  . Prenatal Vit-Fe Fumarate-FA (PNV PRENATAL PLUS MULTIVITAMIN) 27-1 MG TABS Take 1 tablet by mouth daily. 30 tablet 11 Taking    No Known Allergies  Review of Systems: Negative except for what is mentioned in HPI.  Physical Exam: BP 128/86 (BP Location: Left Arm)   Pulse (!) 102   Temp 99.3 F (37.4 C)   Resp 18   Ht 4\' 10"  (1.473 m)   Wt 62.6 kg   LMP 05/16/2017   BMI 28.84 kg/m   CONSTITUTIONAL: Well-developed, well-nourished female in no acute distress.  HENT:  Normocephalic, atraumatic. Oropharynx is clear and moist EYES: Conjunctivae and EOM are normal. No scleral icterus.  NECK: Normal range of motion, supple SKIN: Skin is warm and dry. No rash noted. Not diaphoretic. No erythema. NEUROLGIC: Alert and oriented to person, place, and time. Normal reflexes, muscle tone coordination. No  cranial nerve deficit noted. PSYCHIATRIC: Normal mood and affect. Normal behavior. CARDIOVASCULAR: Normal heart rate noted, regular rhythm RESPIRATORY: Effort and breath sounds normal, no problems with respiration noted ABDOMEN: Soft, nontender, nondistended, gravid.  MUSCULOSKELETAL: Normal range of motion. No edema and no tenderness. 2+ distal pulses.  Dilation: 3 Effacement (%): 80 Cervical Position: Middle Station: -2 Presentation: Undeterminable Exam by:: K. WeissRN FHT: 155 bpm, mod variability, +acels, no decels    Pertinent Labs/Studies:   No results found for this or  any previous visit (from the past 72 hour(s)).  Assessment and Plan :[name] is a 37 y.o. [id number] at 6054w0d being admitted for cesarean section for transverse lie with SROM. The risks of cesarean section discussed with the patient included but were not limited to: bleeding which may require transfusion or reoperation; infection which may require antibiotics; injury to bowel, bladder, ureters or other surrounding organs; injury to the fetus; need for additional procedures including hysterectomy in the event of a life-threatening hemorrhage; placental abnormalities wth subsequent pregnancies, incisional problems, thromboembolic phenomenon and other postoperative/anesthesia complications. The patient concurred with the proposed plan, giving informed written consent for the procedure. Patient has been NPO since 2 hours prior to admission, she will remain NPO for procedure. Patient is a grand multip and ctx frequency/severity is increasing since ROM. Anesthesia and OR aware. Preoperative prophylactic antibiotics and SCDs ordered on call to the OR. To OR when ready.    Marcy Sirenatherine Everlynn Sagun, D.O. OB Fellow  01/30/2018, 11:50 PM

## 2018-01-30 NOTE — MAU Note (Signed)
Dr. Earlene PlaterWallace at bedside to confirm presentation; transverse.

## 2018-01-30 NOTE — MAU Note (Signed)
Pt here with c/o rupture of membranes at about 2230; having some contractions. Was breech on last exam. Scheduled for C/S 02/15/18. Denies any bleeding. Reports good fetal movement.

## 2018-01-31 ENCOUNTER — Other Ambulatory Visit: Payer: Self-pay

## 2018-01-31 ENCOUNTER — Encounter (HOSPITAL_COMMUNITY)
Admission: AD | Disposition: A | Discharge status: Home / Self Care | Payer: Self-pay | Source: Home / Self Care | Attending: Obstetrics and Gynecology

## 2018-01-31 ENCOUNTER — Inpatient Hospital Stay (HOSPITAL_COMMUNITY): Payer: Medicaid Other | Admitting: Anesthesiology

## 2018-01-31 ENCOUNTER — Telehealth: Payer: Self-pay | Admitting: *Deleted

## 2018-01-31 ENCOUNTER — Encounter (HOSPITAL_COMMUNITY): Payer: Self-pay | Admitting: *Deleted

## 2018-01-31 DIAGNOSIS — O321XX0 Maternal care for breech presentation, not applicable or unspecified: Secondary | ICD-10-CM | POA: Diagnosis not present

## 2018-01-31 DIAGNOSIS — D62 Acute posthemorrhagic anemia: Secondary | ICD-10-CM | POA: Diagnosis not present

## 2018-01-31 DIAGNOSIS — O322XX0 Maternal care for transverse and oblique lie, not applicable or unspecified: Secondary | ICD-10-CM | POA: Diagnosis not present

## 2018-01-31 DIAGNOSIS — O9081 Anemia of the puerperium: Secondary | ICD-10-CM | POA: Diagnosis not present

## 2018-01-31 DIAGNOSIS — Z98891 History of uterine scar from previous surgery: Secondary | ICD-10-CM

## 2018-01-31 DIAGNOSIS — Z3A37 37 weeks gestation of pregnancy: Secondary | ICD-10-CM | POA: Diagnosis not present

## 2018-01-31 DIAGNOSIS — O24420 Gestational diabetes mellitus in childbirth, diet controlled: Secondary | ICD-10-CM | POA: Diagnosis not present

## 2018-01-31 DIAGNOSIS — O3663X0 Maternal care for excessive fetal growth, third trimester, not applicable or unspecified: Secondary | ICD-10-CM | POA: Diagnosis not present

## 2018-01-31 DIAGNOSIS — Z87891 Personal history of nicotine dependence: Secondary | ICD-10-CM | POA: Diagnosis not present

## 2018-01-31 DIAGNOSIS — Z3A00 Weeks of gestation of pregnancy not specified: Secondary | ICD-10-CM | POA: Diagnosis not present

## 2018-01-31 DIAGNOSIS — Z302 Encounter for sterilization: Secondary | ICD-10-CM | POA: Diagnosis not present

## 2018-01-31 LAB — CBC
HEMATOCRIT: 32.6 % — AB (ref 36.0–46.0)
Hemoglobin: 11 g/dL — ABNORMAL LOW (ref 12.0–15.0)
MCH: 27.9 pg (ref 26.0–34.0)
MCHC: 33.7 g/dL (ref 30.0–36.0)
MCV: 82.7 fL (ref 78.0–100.0)
PLATELETS: 281 10*3/uL (ref 150–400)
RBC: 3.94 MIL/uL (ref 3.87–5.11)
RDW: 13.9 % (ref 11.5–15.5)
WBC: 14.2 10*3/uL — AB (ref 4.0–10.5)

## 2018-01-31 LAB — TYPE AND SCREEN
ABO/RH(D): O POS
ANTIBODY SCREEN: NEGATIVE

## 2018-01-31 LAB — RPR: RPR: NONREACTIVE

## 2018-01-31 LAB — GLUCOSE, CAPILLARY: GLUCOSE-CAPILLARY: 113 mg/dL — AB (ref 70–99)

## 2018-01-31 SURGERY — Surgical Case
Anesthesia: Spinal

## 2018-01-31 MED ORDER — OXYTOCIN 40 UNITS IN LACTATED RINGERS INFUSION - SIMPLE MED: 2.5000 [IU]/h | INTRAVENOUS | Status: AC

## 2018-01-31 MED ORDER — COCONUT OIL OIL: 1.0000 "application " | TOPICAL_OIL | Status: DC | PRN

## 2018-01-31 MED ORDER — PHENYLEPHRINE 8 MG IN D5W 100 ML (0.08MG/ML) PREMIX OPTIME
INJECTION | INTRAVENOUS | Status: DC | PRN
  Administered 2018-01-31: 60 ug/min via INTRAVENOUS

## 2018-01-31 MED ORDER — PHENYLEPHRINE 8 MG IN D5W 100 ML (0.08MG/ML) PREMIX OPTIME
INJECTION | INTRAVENOUS | Status: AC
  Filled 2018-01-31: qty 100

## 2018-01-31 MED ORDER — ZOLPIDEM TARTRATE 5 MG PO TABS: 5.0000 mg | ORAL_TABLET | Freq: Every evening | ORAL | Status: DC | PRN

## 2018-01-31 MED ORDER — SIMETHICONE 80 MG PO CHEW: 80.0000 mg | CHEWABLE_TABLET | ORAL | Status: DC | PRN

## 2018-01-31 MED ORDER — OXYCODONE HCL 5 MG PO TABS: 5.0000 mg | ORAL_TABLET | ORAL | Status: DC | PRN

## 2018-01-31 MED ORDER — NALOXONE HCL 4 MG/10ML IJ SOLN
1.0000 ug/kg/h | INTRAVENOUS | Status: DC | PRN
  Filled 2018-01-31: qty 5

## 2018-01-31 MED ORDER — OXYCODONE HCL 5 MG PO TABS: 10.0000 mg | ORAL_TABLET | ORAL | Status: DC | PRN

## 2018-01-31 MED ORDER — NALOXONE HCL 0.4 MG/ML IJ SOLN: 0.4000 mg | INTRAMUSCULAR | Status: DC | PRN

## 2018-01-31 MED ORDER — SCOPOLAMINE 1 MG/3DAYS TD PT72
MEDICATED_PATCH | TRANSDERMAL | Status: DC | PRN
  Administered 2018-01-31: 1 via TRANSDERMAL

## 2018-01-31 MED ORDER — CEFAZOLIN SODIUM-DEXTROSE 2-4 GM/100ML-% IV SOLN
2.0000 g | Freq: Once | INTRAVENOUS | Status: AC
  Administered 2018-01-31: 2 g via INTRAVENOUS
  Filled 2018-01-31: qty 100

## 2018-01-31 MED ORDER — ONDANSETRON HCL 4 MG/2ML IJ SOLN
INTRAMUSCULAR | Status: DC | PRN
  Administered 2018-01-31: 4 mg via INTRAVENOUS

## 2018-01-31 MED ORDER — SCOPOLAMINE 1 MG/3DAYS TD PT72: 1.0000 | MEDICATED_PATCH | Freq: Once | TRANSDERMAL | Status: DC

## 2018-01-31 MED ORDER — OXYTOCIN 10 UNIT/ML IJ SOLN
INTRAMUSCULAR | Status: AC
  Filled 2018-01-31: qty 4

## 2018-01-31 MED ORDER — SCOPOLAMINE 1 MG/3DAYS TD PT72
MEDICATED_PATCH | TRANSDERMAL | Status: AC
  Filled 2018-01-31: qty 1

## 2018-01-31 MED ORDER — DIPHENHYDRAMINE HCL 25 MG PO CAPS
25.0000 mg | ORAL_CAPSULE | ORAL | Status: DC | PRN
  Filled 2018-01-31: qty 1

## 2018-01-31 MED ORDER — TERBUTALINE SULFATE 1 MG/ML IJ SOLN
0.2500 mg | Freq: Once | INTRAMUSCULAR | Status: AC
  Administered 2018-01-31: 0.25 mg via SUBCUTANEOUS

## 2018-01-31 MED ORDER — DIBUCAINE 1 % RE OINT: 1.0000 "application " | TOPICAL_OINTMENT | RECTAL | Status: DC | PRN

## 2018-01-31 MED ORDER — NALBUPHINE HCL 10 MG/ML IJ SOLN: 5.0000 mg | INTRAMUSCULAR | Status: DC | PRN

## 2018-01-31 MED ORDER — MENTHOL 3 MG MT LOZG: 1.0000 | LOZENGE | OROMUCOSAL | Status: DC | PRN

## 2018-01-31 MED ORDER — SIMETHICONE 80 MG PO CHEW
80.0000 mg | CHEWABLE_TABLET | ORAL | Status: DC
  Administered 2018-02-01 (×2): 80 mg via ORAL
  Filled 2018-01-31 (×2): qty 1

## 2018-01-31 MED ORDER — NALBUPHINE HCL 10 MG/ML IJ SOLN: 5.0000 mg | Freq: Once | INTRAMUSCULAR | Status: DC | PRN

## 2018-01-31 MED ORDER — STERILE WATER FOR IRRIGATION IR SOLN
Status: DC | PRN
  Administered 2018-01-31: 1000 mL

## 2018-01-31 MED ORDER — MEASLES, MUMPS & RUBELLA VAC ~~LOC~~ INJ
0.5000 mL | INJECTION | Freq: Once | SUBCUTANEOUS | Status: DC
  Filled 2018-01-31: qty 0.5

## 2018-01-31 MED ORDER — IBUPROFEN 800 MG PO TABS: 800.0000 mg | ORAL_TABLET | Freq: Three times a day (TID) | ORAL | Status: DC

## 2018-01-31 MED ORDER — FAMOTIDINE IN NACL 20-0.9 MG/50ML-% IV SOLN
20.0000 mg | Freq: Once | INTRAVENOUS | Status: AC
  Administered 2018-01-31: 20 mg via INTRAVENOUS
  Filled 2018-01-31: qty 50

## 2018-01-31 MED ORDER — SODIUM CHLORIDE 0.9% FLUSH: 3.0000 mL | INTRAVENOUS | Status: DC | PRN

## 2018-01-31 MED ORDER — KETOROLAC TROMETHAMINE 30 MG/ML IJ SOLN: 30.0000 mg | Freq: Once | INTRAMUSCULAR | Status: DC | PRN

## 2018-01-31 MED ORDER — TERBUTALINE SULFATE 1 MG/ML IJ SOLN
INTRAMUSCULAR | Status: AC
  Filled 2018-01-31: qty 1

## 2018-01-31 MED ORDER — WITCH HAZEL-GLYCERIN EX PADS: 1.0000 "application " | MEDICATED_PAD | CUTANEOUS | Status: DC | PRN

## 2018-01-31 MED ORDER — KETOROLAC TROMETHAMINE 30 MG/ML IJ SOLN
INTRAMUSCULAR | Status: DC | PRN
  Administered 2018-01-31: 30 mg via INTRAVENOUS

## 2018-01-31 MED ORDER — TETANUS-DIPHTH-ACELL PERTUSSIS 5-2.5-18.5 LF-MCG/0.5 IM SUSP: 0.5000 mL | Freq: Once | INTRAMUSCULAR | Status: DC

## 2018-01-31 MED ORDER — KETOROLAC TROMETHAMINE 30 MG/ML IJ SOLN
INTRAMUSCULAR | Status: AC
  Filled 2018-01-31: qty 1

## 2018-01-31 MED ORDER — KETOROLAC TROMETHAMINE 30 MG/ML IJ SOLN
30.0000 mg | Freq: Four times a day (QID) | INTRAMUSCULAR | Status: DC
  Administered 2018-01-31 – 2018-02-01 (×4): 30 mg via INTRAVENOUS
  Filled 2018-01-31 (×5): qty 1

## 2018-01-31 MED ORDER — ONDANSETRON HCL 4 MG/2ML IJ SOLN: 4.0000 mg | Freq: Three times a day (TID) | INTRAMUSCULAR | Status: DC | PRN

## 2018-01-31 MED ORDER — FENTANYL CITRATE (PF) 100 MCG/2ML IJ SOLN
INTRAMUSCULAR | Status: AC
  Filled 2018-01-31: qty 2

## 2018-01-31 MED ORDER — MEPERIDINE HCL 25 MG/ML IJ SOLN: 6.2500 mg | INTRAMUSCULAR | Status: DC | PRN

## 2018-01-31 MED ORDER — BUPIVACAINE IN DEXTROSE 0.75-8.25 % IT SOLN
INTRATHECAL | Status: DC | PRN
  Administered 2018-01-31: 12 mg via INTRATHECAL

## 2018-01-31 MED ORDER — MORPHINE SULFATE (PF) 4 MG/ML IV SOLN
2.0000 mg | Freq: Once | INTRAVENOUS | Status: AC
  Administered 2018-01-31: 2 mg via INTRAVENOUS
  Filled 2018-01-31: qty 1

## 2018-01-31 MED ORDER — ENOXAPARIN SODIUM 40 MG/0.4ML ~~LOC~~ SOLN
40.0000 mg | SUBCUTANEOUS | Status: DC
  Administered 2018-02-02: 40 mg via SUBCUTANEOUS
  Filled 2018-01-31 (×2): qty 0.4

## 2018-01-31 MED ORDER — SIMETHICONE 80 MG PO CHEW
80.0000 mg | CHEWABLE_TABLET | Freq: Three times a day (TID) | ORAL | Status: DC
  Administered 2018-02-01 – 2018-02-02 (×4): 80 mg via ORAL
  Filled 2018-01-31 (×4): qty 1

## 2018-01-31 MED ORDER — LACTATED RINGERS IV SOLN
INTRAVENOUS | Status: DC
  Administered 2018-01-31 (×2): via INTRAVENOUS

## 2018-01-31 MED ORDER — ONDANSETRON HCL 4 MG/2ML IJ SOLN
INTRAMUSCULAR | Status: AC
  Filled 2018-01-31: qty 2

## 2018-01-31 MED ORDER — OXYTOCIN 10 UNIT/ML IJ SOLN
INTRAVENOUS | Status: DC | PRN
  Administered 2018-01-31: 40 [IU] via INTRAVENOUS

## 2018-01-31 MED ORDER — FENTANYL CITRATE (PF) 100 MCG/2ML IJ SOLN
100.0000 ug | Freq: Once | INTRAMUSCULAR | Status: AC
  Administered 2018-01-31: 100 ug via INTRAVENOUS
  Filled 2018-01-31: qty 2

## 2018-01-31 MED ORDER — LACTATED RINGERS IV SOLN
INTRAVENOUS | Status: DC | PRN
  Administered 2018-01-31 (×2): via INTRAVENOUS

## 2018-01-31 MED ORDER — HYDROMORPHONE HCL 1 MG/ML IJ SOLN: 0.2500 mg | INTRAMUSCULAR | Status: DC | PRN

## 2018-01-31 MED ORDER — LACTATED RINGERS IV SOLN
INTRAVENOUS | Status: DC
  Administered 2018-01-31 (×2): via INTRAVENOUS

## 2018-01-31 MED ORDER — CEFAZOLIN (ANCEF) 1 G IV SOLR: 2.0000 g | INTRAVENOUS | Status: DC

## 2018-01-31 MED ORDER — PROMETHAZINE HCL 25 MG/ML IJ SOLN: 6.2500 mg | INTRAMUSCULAR | Status: DC | PRN

## 2018-01-31 MED ORDER — MORPHINE SULFATE (PF) 0.5 MG/ML IJ SOLN
INTRAMUSCULAR | Status: DC | PRN
  Administered 2018-01-31: .15 mg via INTRATHECAL

## 2018-01-31 MED ORDER — LACTATED RINGERS IV SOLN
INTRAVENOUS | Status: DC | PRN
  Administered 2018-01-31: 06:00:00 via INTRAVENOUS

## 2018-01-31 MED ORDER — SODIUM CHLORIDE 0.9 % IR SOLN
Status: DC | PRN
  Administered 2018-01-31: 1000 mL

## 2018-01-31 MED ORDER — ACETAMINOPHEN 325 MG PO TABS
650.0000 mg | ORAL_TABLET | ORAL | Status: DC | PRN
  Administered 2018-02-01 – 2018-02-02 (×2): 650 mg via ORAL
  Filled 2018-01-31 (×2): qty 2

## 2018-01-31 MED ORDER — SENNOSIDES-DOCUSATE SODIUM 8.6-50 MG PO TABS
2.0000 | ORAL_TABLET | ORAL | Status: DC
  Administered 2018-02-01: 2 via ORAL
  Filled 2018-01-31: qty 2

## 2018-01-31 MED ORDER — DIPHENHYDRAMINE HCL 25 MG PO CAPS: 25.0000 mg | ORAL_CAPSULE | Freq: Four times a day (QID) | ORAL | Status: DC | PRN

## 2018-01-31 MED ORDER — PRENATAL MULTIVITAMIN CH
1.0000 | ORAL_TABLET | Freq: Every day | ORAL | Status: DC
  Administered 2018-02-01 – 2018-02-02 (×2): 1 via ORAL
  Filled 2018-01-31 (×2): qty 1

## 2018-01-31 MED ORDER — DIPHENHYDRAMINE HCL 50 MG/ML IJ SOLN: 12.5000 mg | INTRAMUSCULAR | Status: DC | PRN

## 2018-01-31 MED ORDER — PROMETHAZINE HCL 25 MG/ML IJ SOLN
25.0000 mg | Freq: Once | INTRAMUSCULAR | Status: AC
  Administered 2018-01-31: 25 mg via INTRAVENOUS
  Filled 2018-01-31: qty 1

## 2018-01-31 MED ORDER — FENTANYL CITRATE (PF) 100 MCG/2ML IJ SOLN
INTRAMUSCULAR | Status: DC | PRN
  Administered 2018-01-31: 15 ug via INTRATHECAL
  Administered 2018-01-31: 85 ug via INTRAVENOUS

## 2018-01-31 MED ORDER — MORPHINE SULFATE (PF) 0.5 MG/ML IJ SOLN
INTRAMUSCULAR | Status: AC
  Filled 2018-01-31: qty 10

## 2018-01-31 MED ORDER — SOD CITRATE-CITRIC ACID 500-334 MG/5ML PO SOLN
30.0000 mL | Freq: Once | ORAL | Status: AC
  Administered 2018-01-31: 30 mL via ORAL
  Filled 2018-01-31: qty 15

## 2018-01-31 SURGICAL SUPPLY — 41 items
BENZOIN TINCTURE PRP APPL 2/3 (GAUZE/BANDAGES/DRESSINGS) ×3 IMPLANT
CLAMP CORD UMBIL (MISCELLANEOUS) IMPLANT
CLOSURE WOUND 1/2 X4 (GAUZE/BANDAGES/DRESSINGS) ×1
CLOTH BEACON ORANGE TIMEOUT ST (SAFETY) ×3 IMPLANT
DERMABOND ADVANCED (GAUZE/BANDAGES/DRESSINGS) ×4
DERMABOND ADVANCED .7 DNX12 (GAUZE/BANDAGES/DRESSINGS) ×2 IMPLANT
DRSG OPSITE POSTOP 4X10 (GAUZE/BANDAGES/DRESSINGS) ×3 IMPLANT
ELECT REM PT RETURN 9FT ADLT (ELECTROSURGICAL) ×3
ELECTRODE REM PT RTRN 9FT ADLT (ELECTROSURGICAL) ×1 IMPLANT
EXTRACTOR VACUUM BELL STYLE (SUCTIONS) IMPLANT
GLOVE BIOGEL PI IND STRL 7.0 (GLOVE) ×1 IMPLANT
GLOVE BIOGEL PI IND STRL 8 (GLOVE) ×1 IMPLANT
GLOVE BIOGEL PI INDICATOR 7.0 (GLOVE) ×2
GLOVE BIOGEL PI INDICATOR 8 (GLOVE) ×2
GLOVE ECLIPSE 8.0 STRL XLNG CF (GLOVE) ×3 IMPLANT
GOWN STRL REUS W/TWL LRG LVL3 (GOWN DISPOSABLE) ×6 IMPLANT
KIT ABG SYR 3ML LUER SLIP (SYRINGE) ×3 IMPLANT
NEEDLE HYPO 18GX1.5 BLUNT FILL (NEEDLE) ×3 IMPLANT
NEEDLE HYPO 22GX1.5 SAFETY (NEEDLE) ×3 IMPLANT
NEEDLE HYPO 25X5/8 SAFETYGLIDE (NEEDLE) ×3 IMPLANT
NS IRRIG 1000ML POUR BTL (IV SOLUTION) ×3 IMPLANT
PACK C SECTION WH (CUSTOM PROCEDURE TRAY) ×3 IMPLANT
PAD OB MATERNITY 4.3X12.25 (PERSONAL CARE ITEMS) ×3 IMPLANT
PENCIL SMOKE EVAC W/HOLSTER (ELECTROSURGICAL) ×3 IMPLANT
RTRCTR C-SECT PINK 25CM LRG (MISCELLANEOUS) IMPLANT
STRIP CLOSURE SKIN 1/2X4 (GAUZE/BANDAGES/DRESSINGS) ×2 IMPLANT
SUT CHROMIC 0 CT 1 (SUTURE) ×3 IMPLANT
SUT CHROMIC 1 CTX 36 (SUTURE) ×3 IMPLANT
SUT MNCRL 0 VIOLET CTX 36 (SUTURE) ×2 IMPLANT
SUT MONOCRYL 0 CTX 36 (SUTURE) ×4
SUT PLAIN 2 0 (SUTURE) ×4
SUT PLAIN 2 0 XLH (SUTURE) IMPLANT
SUT PLAIN ABS 2-0 CT1 27XMFL (SUTURE) ×2 IMPLANT
SUT VIC AB 0 CTX 36 (SUTURE) ×2
SUT VIC AB 0 CTX36XBRD ANBCTRL (SUTURE) ×1 IMPLANT
SUT VIC AB 3-0 SH 27 (SUTURE) ×2
SUT VIC AB 3-0 SH 27X BRD (SUTURE) ×1 IMPLANT
SUT VIC AB 4-0 KS 27 (SUTURE) IMPLANT
SYR 20CC LL (SYRINGE) ×6 IMPLANT
TOWEL OR 17X24 6PK STRL BLUE (TOWEL DISPOSABLE) ×3 IMPLANT
TRAY FOLEY W/BAG SLVR 14FR LF (SET/KITS/TRAYS/PACK) IMPLANT

## 2018-01-31 NOTE — Telephone Encounter (Signed)
Left message with daughter for pt to call us back to schedule pp appointment.  01-31-18  AS

## 2018-01-31 NOTE — Plan of Care (Signed)
  Problem: Health Behavior/Discharge Planning: Goal: Ability to manage health-related needs will improve Outcome: Progressing   Problem: Clinical Measurements: Goal: Cardiovascular complication will be avoided Outcome: Progressing Note:  Pt RUE noted to be swollen non pitting.  Extremity elevated & will con't to monitor.   Problem: Nutrition: Goal: Adequate nutrition will be maintained Outcome: Progressing Note:  Pt has not had very little PO fluid.  Pt has been encouraged to increase intake.   Problem: Pain Managment: Goal: General experience of comfort will improve Outcome: Progressing Note:  Pain expectations discussed. Pt has only required Toradol & ice to incision.   Problem: Safety: Goal: Ability to remain free from injury will improve Outcome: Progressing   Problem: Education: Goal: Knowledge of condition will improve Outcome: Progressing Note:  Pt educated on incentive spirometer use & demo effectively; deep breathing/coughing & splinting; & benefits of early ambulation.  Discussed tolerable pain vs uncontrollable pain.   Problem: Activity: Goal: Will verbalize the importance of balancing activity with adequate rest periods Outcome: Progressing Goal: Ability to tolerate increased activity will improve Outcome: Progressing Note:  Orthostatics completed.  1st time pt experienced hypotension upon standing.  2nd time pt normotensive & able to ambulate in room without difficulty; tolerated well.  Pt educated on benefits of early ambulation.   Problem: Life Cycle: Goal: Chance of risk for complications during the postpartum period will decrease Outcome: Progressing Note:  Fundas firm & midline, lochia WNL without clots.  Honeycomb dressing clean, dry, & intact.

## 2018-01-31 NOTE — Anesthesia Preprocedure Evaluation (Signed)
Anesthesia Evaluation  Patient identified by MRN, date of birth, ID band Patient awake    Reviewed: Allergy & Precautions, H&P , NPO status , Patient's Chart, lab work & pertinent test results  Airway Mallampati: II  TM Distance: >3 FB Neck ROM: full    Dental   Pulmonary former smoker,    breath sounds clear to auscultation       Cardiovascular  Rhythm:regular Rate:Normal     Neuro/Psych Seizures -,     GI/Hepatic   Endo/Other  diabetes  Renal/GU      Musculoskeletal   Abdominal   Peds  Hematology   Anesthesia Other Findings   Reproductive/Obstetrics (+) Pregnancy                             Anesthesia Physical  Anesthesia Plan  ASA: III  Anesthesia Plan: Spinal   Post-op Pain Management:    Induction:   PONV Risk Score and Plan: 4 or greater and Ondansetron, Dexamethasone, Scopolamine patch - Pre-op and Treatment may vary due to age or medical condition  Airway Management Planned:   Additional Equipment:   Intra-op Plan:   Post-operative Plan:   Informed Consent: I have reviewed the patients History and Physical, chart, labs and discussed the procedure including the risks, benefits and alternatives for the proposed anesthesia with the patient or authorized representative who has indicated his/her understanding and acceptance.   Dental advisory given  Plan Discussed with: CRNA  Anesthesia Plan Comments:         Anesthesia Quick Evaluation Came to room and mom pushing.  Decision not to proceed

## 2018-01-31 NOTE — Consult Note (Signed)
Neonatology Note:   Attendance at C-section:    I was asked by Dr. Alysia PennaErvin to attend this primary C/S at 37 1/7 weeks due to onset of labor and transverse lie. The mother is a G9P7A1 O pos, GBS neg with GDM, mother not taking any medication for control; and known fetal macrosomia. She also has HSV-2, without active lesions, not on Valtrex. ROM 8 hours prior to delivery, fluid clear. Mother got 2 doses of IV Fentanyl and 1 dose of Morphine since midnight. Infant vigorous with good spontaneous cry and tone. Delayed cord clamping was done. Needed only minimal bulb suctioning. Ap 9/10. Lungs clear to ausc in DR. Infant is able to remain with his mother for skin to skin time under nursing supervision. I asked RN to continue to watch for potential respiratory depression due to recent maternal analgesia and I spoke with the parents about the potential for infant to have hypoglycemia. Transferred to the care of Pediatrician.   Jillian Souhristie C. Dimitriy Carreras, MD

## 2018-01-31 NOTE — Anesthesia Procedure Notes (Signed)
Spinal  Patient location during procedure: OR Staffing Anesthesiologist: Dorinne Graeff, MD Performed: anesthesiologist  Preanesthetic Checklist Completed: patient identified, site marked, surgical consent, pre-op evaluation, timeout performed, IV checked, risks and benefits discussed and monitors and equipment checked Spinal Block Patient position: sitting Prep: site prepped and draped and DuraPrep Patient monitoring: heart rate, continuous pulse ox and blood pressure Approach: midline Location: L3-4 Injection technique: single-shot Needle Needle type: Sprotte  Needle gauge: 24 G Needle length: 9 cm Additional Notes Expiration date of kit checked and confirmed. Patient tolerated procedure well, without complications.       

## 2018-01-31 NOTE — Transfer of Care (Signed)
Immediate Anesthesia Transfer of Care Note  Patient: Jillian FerrisAmanda R Harkless  Procedure(s) Performed: CESAREAN SECTION WITH BILATERAL TUBAL LIGATION (N/A )  Patient Location: PACU  Anesthesia Type:Spinal  Level of Consciousness: awake, alert  and oriented  Airway & Oxygen Therapy: Patient Spontanous Breathing  Post-op Assessment: Report given to RN and Post -op Vital signs reviewed and stable  Post vital signs: Reviewed and stable  Last Vitals:  Vitals Value Taken Time  BP 107/73 01/31/2018  7:23 AM  Temp    Pulse 87 01/31/2018  7:30 AM  Resp 16 01/31/2018  7:30 AM  SpO2 99 % 01/31/2018  7:30 AM  Vitals shown include unvalidated device data.  Last Pain:  Vitals:   01/31/18 0723  TempSrc: (P) Oral  PainSc: (P) 0-No pain         Complications: No apparent anesthesia complications

## 2018-01-31 NOTE — Op Note (Addendum)
Operative Report  PATIENT: [name]  PROCEDURE DATE: 01/31/2018  PREOPERATIVE DIAGNOSES: Intrauterine pregnancy at [redacted]w[redacted]d weeks gestation; malpresentation: breech;  Undesired fertility  POSTOPERATIVE DIAGNOSES: The same  PROCEDURE: Primary Low Transverse Cesarean Section; Bilateral Tubal Sterilization using Filshie clips  SURGEON:   Surgeon(s) and Role:    * Hermina Staggers, MD - Primary    * Arvilla Market, DO - Fellow -    INDICATIONS: [name] is a 37 y.o. [id number] at [redacted]w[redacted]d here for cesarean section secondary to the indications listed under preoperative diagnoses; please see preoperative note for further details.  The risks of cesarean section were discussed with the patient including but were not limited to: bleeding which may require transfusion or reoperation; infection which may require antibiotics; injury to bowel, bladder, ureters or other surrounding organs; injury to the fetus; need for additional procedures including hysterectomy in the event of a life-threatening hemorrhage; placental abnormalities wth subsequent pregnancies, incisional problems, thromboembolic phenomenon and other postoperative/anesthesia complications.   The patient concurred with the proposed plan, giving informed written consent for the procedure.    FINDINGS:  Viable female infant in breech presentation.  Apgars 9 and 10.  Clear amniotic fluid.  Intact placenta, three vessel cord.  Normal uterus, fallopian tubes and ovaries bilaterally.  ANESTHESIA: Spinal INTRAVENOUS FLUIDS:400 mL ESTIMATED BLOOD LOSS: 973 mL URINE OUTPUT:  100 ml SPECIMENS: Placenta sent to L&D COMPLICATIONS: None immediate  PROCEDURE IN DETAIL:  The patient preoperatively received intravenous antibiotics and had sequential compression devices applied to her lower extremities.  She was then taken to the operating room where spinal anesthesia was administered and was found to be adequate. She was then placed in a  dorsal supine position with a leftward tilt, and prepped and draped in a sterile manner.  A foley catheter was placed into her bladder and attached to constant gravity.    After an adequate timeout was performed, a Pfannenstiel skin incision was made with scalpel and carried through to the underlying layer of fascia. The fascia was incised in the midline, and this incision was extended bilaterally using the Mayo scissors.  Kocher clamps were applied to the superior aspect of the fascial incision and the underlying rectus muscles were dissected off bluntly.  A similar process was carried out on the inferior aspect of the fascial incision. The rectus muscles were separated in the midline bluntly and the peritoneum was entered bluntly. Attention was turned to the lower uterine segment where a low transverse hysterotomy was made with a scalpel and extended bilaterally bluntly.  The infant was successfully delivered, the cord was clamped and cut after one minute, and the infant was handed over to the awaiting neonatology team. Uterine massage was then administered, and the placenta delivered intact with a three-vessel cord. The uterus was then cleared of clots and debris.  The hysterotomy was closed with 0 Vicryl in a running locked fashion, and an imbricating layer was also placed with 0 Vicryl.  Figure-of-eight 0 Vicryl serosal stitches were placed to help with hemostasis.  Attention was then turned to the fallopian tubes.  The patient's right fallopian tube was then identified and grasped with a Babcock clamp approximately 4 cm from the cornual region. A 3 cm segment of the tube was then ligated with free tie of plain gut suture x2, transected and excised. Good hemostasis was noted and the tube was returned to the abdomen. The left fallopian tube was then identified to its fimbriated end, ligated,  and a 3 cm segment excised in a similar fashion. Interrupted sutures were placed for further hemostasis of the tube. The  pelvis was cleared of all clot and debris. Hemostasis was confirmed on all surfaces.  The peritoneum was closed with a 0 Vicryl running stitch and the rectus muscles were reapproximated using 2/0 Vicryl interrupted stitches. The fascia was then closed using 0 Vicryl in a running fashion from connor to midline. The subcutaneous layer was irrigated, then reapproximated with 3/0 Vicryl interrupted stitches. The skin was closed with a 4-0 Vicryl subcuticular stitch.   The patient tolerated the procedure well. Sponge, lap, instrument and needle counts were correct x 3.  She was taken to the recovery room in stable condition.    Disposition: PACU - hemodynamically stable.   Maternal Condition: stable   Marcy Sirenatherine Wallace, D.O. OB Fellow  01/31/2018, 7:57 AM    Attestation of Attending Supervision of Obstetric Fellow During Surgery: Surgery was performed with the Obstetric Fellow under my supervision and collaboration.  I was present and scrubbed for the entire procedure.   I have reviewed the Obstetric Fellow's operative report, and I agree with the documentation.   Nettie ElmMichael Kijana Cromie, MD, FACOG Attending Obstetrician & Gynecologist Faculty Practice, [facility] [place]

## 2018-01-31 NOTE — Progress Notes (Signed)
CBG in PACU 113

## 2018-02-01 ENCOUNTER — Telehealth: Payer: Self-pay | Admitting: *Deleted

## 2018-02-01 LAB — CBC
HCT: 20.4 % — ABNORMAL LOW (ref 36.0–46.0)
Hemoglobin: 7 g/dL — ABNORMAL LOW (ref 12.0–15.0)
MCH: 28.6 pg (ref 26.0–34.0)
MCHC: 34.3 g/dL (ref 30.0–36.0)
MCV: 83.3 fL (ref 78.0–100.0)
PLATELETS: 211 10*3/uL (ref 150–400)
RBC: 2.45 MIL/uL — AB (ref 3.87–5.11)
RDW: 14.1 % (ref 11.5–15.5)
WBC: 11.7 10*3/uL — ABNORMAL HIGH (ref 4.0–10.5)

## 2018-02-01 LAB — CREATININE, SERUM
CREATININE: 0.59 mg/dL (ref 0.44–1.00)
GFR calc Af Amer: >60 mL/min (ref >60)

## 2018-02-01 LAB — BIRTH TISSUE RECOVERY COLLECTION (PLACENTA DONATION)

## 2018-02-01 MED ORDER — FERROUS SULFATE 325 (65 FE) MG PO TABS
325.0000 mg | ORAL_TABLET | Freq: Three times a day (TID) | ORAL | Status: DC
  Administered 2018-02-01 – 2018-02-02 (×3): 325 mg via ORAL
  Filled 2018-02-01 (×3): qty 1

## 2018-02-01 MED ORDER — IBUPROFEN 800 MG PO TABS
800.0000 mg | ORAL_TABLET | Freq: Four times a day (QID) | ORAL | Status: DC | PRN
  Administered 2018-02-01 – 2018-02-02 (×4): 800 mg via ORAL
  Filled 2018-02-01 (×4): qty 1

## 2018-02-01 MED ORDER — SODIUM CHLORIDE 0.9 % IV SOLN
510.0000 mg | Freq: Once | INTRAVENOUS | Status: DC
  Filled 2018-02-01: qty 17

## 2018-02-01 MED ORDER — DOCUSATE SODIUM 100 MG PO CAPS
100.0000 mg | ORAL_CAPSULE | Freq: Two times a day (BID) | ORAL | Status: DC
  Administered 2018-02-02: 100 mg via ORAL
  Filled 2018-02-01: qty 1

## 2018-02-01 NOTE — Progress Notes (Addendum)
Post Partum Day 1  Subjective:  Bradley Ferrismanda R Link is a 37 y.o. [id number] 6727w1d s/p LTCS.  Patient reports she had to have her bandages changes over night because it was bleeding through the bandage. Bandage appears clean and intact at this time.  Pt denies problems with ambulating, po intake. Reports her foley was recently removed and has not voided yet. She denies nausea or vomiting.  Pain is moderately controlled.  She has had flatus. She has not had bowel movement.  Lochia Small.  Plan for birth control is BTL.  Method of Feeding: Bottle  Denies HA, dizziness, changes in vision, SOB, CP, upper abdominal pain, nausea, vomiting, diarrhea.    Objective: BP 98/66 (BP Location: Right Arm)   Pulse 88   Temp 98 F (36.7 C) (Oral)   Resp 18   Ht 4\' 10"  (1.473 m)   Wt 62.6 kg   LMP 05/16/2017   SpO2 98%   Breastfeeding? Unknown   BMI 28.84 kg/m   Physical Exam:  General: alert, cooperative and no distress Lochia:normal flow Chest: CTAB Heart: RRR no m/r/g Abdomen: +BS, soft, fundus firm at/below umbilicus, tender to palpation around incision site  Uterine Fundus: firm, tender around incision site.  DVT Evaluation: No evidence of DVT seen on physical exam. Extremities: no edema  Recent Labs    01/31/18 0030 02/01/18 0609  HGB 11.0* 7.0*  HCT 32.6* 20.4*    Assessment/Plan:  ASSESSMENT: Bradley Ferrismanda R Zhang is a 37 y.o. [id number] 1227w1d pod #1 s/p LTCS + BTL doing well. PMH of class A2 DM. Complains of surgical site pain that is being managed with medications but not completely eliminated.   Post-op anemia  - hbg drop from 11.0 to 7.0 and hct drop 32.6 to 20.4  - asymptomatic at this time  - trend H/H with morning labs  - repeat H/H early if becomes symptomatic  - incision site with pressure dressing in place after having to be replaced over night  Class A2 DM  - stable  - previose CBG have been normal   Contraception BTL, bottle feeding.   LOS: 1 day   Celedonio SavageJohn V  Donnalyn Juran 02/01/2018, 9:06 AM

## 2018-02-01 NOTE — Anesthesia Postprocedure Evaluation (Signed)
Anesthesia Post Note  Patient: Bradley Ferrismanda R Kemp  Procedure(s) Performed: CESAREAN SECTION WITH BILATERAL TUBAL LIGATION (N/A )     Patient location during evaluation: Mother Baby Anesthesia Type: Spinal Level of consciousness: oriented and awake and alert Pain management: pain level controlled Vital Signs Assessment: post-procedure vital signs reviewed and stable Respiratory status: spontaneous breathing and respiratory function stable Cardiovascular status: blood pressure returned to baseline and stable Postop Assessment: no headache, no backache and no apparent nausea or vomiting Anesthetic complications: no    Last Vitals:  Vitals:   02/01/18 0100 02/01/18 0500  BP: 101/72 98/66  Pulse: 97 88  Resp: 18 18  Temp: 36.7 C 36.7 C  SpO2: 99% 98%    Last Pain:  Vitals:   02/01/18 0500  TempSrc: Oral  PainSc:    Pain Goal:                 Junious SilkGILBERT,[place]

## 2018-02-01 NOTE — Progress Notes (Signed)
Patient refuses fereheme. She states that she will take oral iron. She denies any sx at this time: denies dizziness, HA, shortness of breath or chest pain. She is ambulating without difficulty.   Jillian ShellerHeather Aren Pryde 12:52 PM 02/01/18

## 2018-02-01 NOTE — Telephone Encounter (Signed)
Lmom for pt to call us back to schedule pp appointment.  02-01-18  AS

## 2018-02-02 ENCOUNTER — Other Ambulatory Visit: Payer: Medicaid Other

## 2018-02-02 ENCOUNTER — Encounter (HOSPITAL_COMMUNITY): Payer: Self-pay | Admitting: Obstetrics and Gynecology

## 2018-02-02 ENCOUNTER — Encounter: Payer: Medicaid Other | Admitting: Advanced Practice Midwife

## 2018-02-02 DIAGNOSIS — O9903 Anemia complicating the puerperium: Secondary | ICD-10-CM

## 2018-02-02 LAB — CBC
HCT: 20.4 % — ABNORMAL LOW (ref 36.0–46.0)
Hemoglobin: 6.9 g/dL — CL (ref 12.0–15.0)
MCH: 28.5 pg (ref 26.0–34.0)
MCHC: 33.8 g/dL (ref 30.0–36.0)
MCV: 84.3 fL (ref 78.0–100.0)
PLATELETS: 212 10*3/uL (ref 150–400)
RBC: 2.42 MIL/uL — ABNORMAL LOW (ref 3.87–5.11)
RDW: 14.1 % (ref 11.5–15.5)
WBC: 10.5 10*3/uL (ref 4.0–10.5)

## 2018-02-02 MED ORDER — IBUPROFEN 800 MG PO TABS: 800.0000 mg | ORAL_TABLET | Freq: Four times a day (QID) | ORAL | 0 refills | Status: DC | PRN

## 2018-02-02 MED ORDER — OXYCODONE HCL 5 MG PO TABS: 5.0000 mg | ORAL_TABLET | ORAL | 0 refills | Status: DC | PRN

## 2018-02-02 MED ORDER — DOCUSATE SODIUM 100 MG PO CAPS: 100.0000 mg | ORAL_CAPSULE | Freq: Every day | ORAL | 2 refills | Status: DC | PRN

## 2018-02-02 NOTE — Progress Notes (Signed)
Received critical lab value for hgb 6.9.  Patient is asymptomatics with BP 107/64, pulse 81.  Notified Dr. Parke SimmersBland for the result.  No new orders at this time.

## 2018-02-02 NOTE — Progress Notes (Addendum)
Post Op Day 2   Subjective:  Bradley Ferrismanda R Wiltsey is a 37 y.o. [id number] 8382w1d s/p LTCS.  No acute events overnight.  Pt denies problems with ambulating, voiding or po intake. Bandage appears clean, dry, and intact.  She denies nausea or vomiting.  Pain is well controlled.  She has had flatus. She has had bowel movement.  Lochia Small.  Plan for birth control is BTL.  Method of Feeding: Breast and bottle.   Has many questions about her hemoglobin being low. Counseled on taking oral iron twice daily. Gave anticipatory guidance on signs and symptoms of blood loss.   Denies HA, dizziness, changes in vision, SOB, CP, upper abdominal pain, nausea, vomiting, diarrhea.    Objective: BP 107/64 (BP Location: Right Arm)   Pulse 81   Temp 98.2 F (36.8 C) (Oral)   Resp 18   Ht 4\' 10"  (1.473 m)   Wt 62.6 kg   LMP 05/16/2017   SpO2 100%   Breastfeeding? Unknown   BMI 28.84 kg/m   Physical Exam:  General: alert, cooperative and no distress Lochia:normal flow Chest: CTAB Heart: RRR no m/r/g Abdomen: +BS, soft, fundus firm at/below umbilicus, tender to palpation around incision site  Uterine Fundus: firm, tender around incision site.  DVT Evaluation: No evidence of DVT seen on physical exam. Extremities: no edema  Recent Labs    02/01/18 0609 02/02/18 0509  HGB 7.0* 6.9*  HCT 20.4* 20.4*    Assessment/Plan:  ASSESSMENT: Bradley Ferrismanda R Balsam is a 37 y.o. [id number] 4082w1d ppd #2 s/p LTCS + BTL doing well. PMH of class A2 DM. Complains of surgical site pain that is being managed with medications but not completely eliminated.   Post-op anemia  - hbg drop from 11.0 to 7.0 to 6.9 and hct drop 32.6 to 20.4 to 20.4 - asymptomatic at this time  - trend H/H with morning labs  - repeat H/H early if becomes symptomatic  - incision site with pressure dressing in place after having to be replaced over night - taking oral iron supplements   Class A2 DM  - stable  - previose CBG have been normal    LOS:  2 days   Celedonio SavageJohn V Cresenzo 02/02/2018, 8:53 AM   OB FELLOW POSTPARTUM PROGRESS NOTE ATTESTATION  I have seen and examined this patient and agree with above documentation in the student's note. Please see discharge summary from same day, 8/29, for further documentation.   Marcy Sirenatherine Deloros Beretta, D.O. OB Fellow  02/07/2018, 2:18 PM

## 2018-02-02 NOTE — Discharge Summary (Signed)
OB Discharge Summary     Patient Name: [name] DOB: [birth year] MRN: [id number]  Date of admission: 01/30/2018 Delivering MD: Hermina Staggers   Date of discharge: 02/02/2018  Admitting diagnosis: 37 WEEKS ROM Intrauterine pregnancy: [redacted]w[redacted]d     Secondary diagnosis:  Active Problems:   Status post primary low transverse cesarean section  Additional problems: Anemia postpartum     Discharge diagnosis: Term Pregnancy Delivered                                                                                                Post partum procedures:None, pt offered IV iron infusion, declined, asymptomatic with anemia  Augmentation: n/a  Complications: None  Hospital course:  Sceduled C/S   37 y.o. yo [id number] at [redacted]w[redacted]d was admitted to the hospital 01/30/2018 for scheduled cesarean section with the following indication:Malpresentation.  Membrane Rupture Time/Date: 10:30 PM ,01/30/2018   Patient delivered a Viable infant.01/31/2018  Details of operation can be found in separate operative note.  Pateint had an uncomplicated postpartum course.  She is ambulating, tolerating a regular diet, passing flatus, and urinating well. Patient is discharged home in stable condition on  02/02/18         Physical exam  Vitals:   02/01/18 0500 02/01/18 1452 02/01/18 2119 02/02/18 0557  BP: 98/66 109/71 108/71 107/64  Pulse: 88 (!) 104 (!) 101 81  Resp: 18 17 18 18   Temp: 98 F (36.7 C) 99.3 F (37.4 C) 98.7 F (37.1 C) 98.2 F (36.8 C)  TempSrc: Oral Oral Oral Oral  SpO2: 98% 99% 99% 100%  Weight:      Height:       General: alert, cooperative and no distress Lochia: appropriate Uterine Fundus: firm Incision: Dressing is clean, dry, and intact DVT Evaluation: No evidence of DVT seen on physical exam. Labs: Lab Results  Component Value Date   WBC 10.5 02/02/2018   HGB 6.9 (LL) 02/02/2018   HCT 20.4 (L) 02/02/2018   MCV 84.3 02/02/2018   PLT 212 02/02/2018   CMP Latest Ref Rng &  Units 02/01/2018  Glucose 65 - 99 mg/dL -  BUN 6 - 20 mg/dL -  Creatinine 0.27 - 2.53 mg/dL 6.64  Sodium 403 - 474 mmol/L -  Potassium 3.5 - 5.1 mmol/L -  Chloride 101 - 111 mmol/L -  CO2 22 - 32 mmol/L -  Calcium 8.9 - 10.3 mg/dL -  Total Protein 6.5 - 8.1 g/dL -  Total Bilirubin 0.3 - 1.2 mg/dL -  Alkaline Phos 38 - 259 U/L -  AST 15 - 41 U/L -  ALT 14 - 54 U/L -    Discharge instruction: per After Visit Summary and "Baby and Me Booklet".  After visit meds:  Allergies as of 02/02/2018   No Known Allergies     Medication List    STOP taking these medications   metFORMIN 500 MG tablet Commonly known as:  GLUCOPHAGE     TAKE these medications   docusate sodium 100 MG capsule Commonly known as:  COLACE Take 1 capsule (100 mg total)  by mouth daily as needed.   ibuprofen 800 MG tablet Commonly known as:  ADVIL,MOTRIN Take 1 tablet (800 mg total) by mouth every 6 (six) hours as needed for fever, headache, mild pain, moderate pain or cramping.   oxyCODONE 5 MG immediate release tablet Commonly known as:  Oxy IR/ROXICODONE Take 1 tablet (5 mg total) by mouth every 4 (four) hours as needed (pain scale 4-7).   PNV PRENATAL PLUS MULTIVITAMIN 27-1 MG Tabs Take 1 tablet by mouth daily.       Diet: routine diet  Activity: Advance as tolerated. Pelvic rest for 6 weeks.   Outpatient follow up:2 weeks Follow up Appt: Future Appointments  Date Time Provider Department Center  02/10/2018 12:00 PM Lazaro ArmsEure, Luther H, MD [facility]  03/09/2018 11:00 AM Cresenzo-Dishmon, Scarlette CalicoFrances, CNM [facility]   Follow up Visit:No follow-ups on file.  Postpartum contraception: Tubal Ligation  Newborn Data: Live born female  Birth Weight: 8 lb 6 oz (3800 g) APGAR: 9, 10  Newborn Delivery   Birth date/time:  01/31/2018 06:14:00 Delivery type:  C-Section, Low Transverse Trial of labor:  No C-section categorization:  Primary     Baby Feeding: breast and bottle Disposition:home  with mother   02/02/2018 Sharen CounterLisa Leftwich-Kirby, CNM

## 2018-02-03 ENCOUNTER — Encounter: Payer: Self-pay | Admitting: *Deleted

## 2018-02-03 ENCOUNTER — Telehealth: Payer: Self-pay | Admitting: *Deleted

## 2018-02-03 ENCOUNTER — Ambulatory Visit (INDEPENDENT_AMBULATORY_CARE_PROVIDER_SITE_OTHER): Payer: Medicaid Other | Admitting: Women's Health

## 2018-02-03 VITALS — BP 118/72

## 2018-02-03 DIAGNOSIS — Z0130 Encounter for examination of blood pressure without abnormal findings: Secondary | ICD-10-CM

## 2018-02-03 DIAGNOSIS — Z9889 Other specified postprocedural states: Secondary | ICD-10-CM

## 2018-02-03 NOTE — Patient Instructions (Signed)
Call the office ([phone]) or [facility] for these signs of pre-eclampsia:  Severe headache that does not go away with Tylenol  Visual changes- seeing spots, double, blurred vision  Pain under your right breast or upper abdomen that does not go away with Tums or heartburn medicine  Nausea and/or vomiting  Severe swelling in your hands, feet, and face       

## 2018-02-03 NOTE — Progress Notes (Signed)
   GYN VISIT Patient name: [name] R Tomassetti MRN [id number]  Date of birth: [birth year] Chief Complaint:   Blood Pressure Check  History of Present Illness:   [name] R Pardoe is a 37 y.o. [id number] Caucasian female 3d s/p PLTCS being seen today for blood pressure check.  Had some swelling in fingers yesterday that resolved. This morning woke up, had been sleeping on Rt hip, and had a knot in Rt thigh, thought it was a blood clot, but it has gone away- was very anxious about it. BPs were good in hospital. Has h/o PRES w/ subarachnoid bleed/seizures/intubation 1wk pp in 2009- never w/ any other baby. Has had mild headache behind Rt eye, hasn't had to take anything specifically for it. Has been taking ibuprofen for incisional pain, which does help w/ headache. Denies visual changes, ruq/epigastric pain, n/v. Bottlefeeding.   No LMP recorded. Review of Systems:   Pertinent items are noted in HPI Denies fever/chills, dizziness, headaches, visual disturbances, fatigue, shortness of breath, chest pain, abdominal pain, vomiting, abnormal vaginal discharge/itching/odor/irritation, problems with periods, bowel movements, urination, or intercourse unless otherwise stated above.  Pertinent History Reviewed:  Reviewed past medical,surgical, social, obstetrical and family history.  Reviewed problem list, medications and allergies. Physical Assessment:   Vitals:   02/03/18 1100 02/03/18 1105 02/03/18 1235 02/03/18 1238  BP: 136/73 (!) 147/93 122/78 118/72  There is no height or weight on file to calculate BMI.       Physical Examination:   General appearance: alert, well appearing, and in no distress  Mental status: alert, oriented to person, place, and time  Skin: warm & dry   Cardiovascular: normal heart rate noted  Respiratory: normal respiratory effort, no distress  Abdomen: soft, non-tender   Pelvic: examination not indicated  Extremities: no edema, no evidence of DVT Rt thigh, no knot or abnormality of  Rt thigh  No results found for this or any previous visit (from the past 24 hour(s)).  Assessment & Plan:  1) 3d s/p PLTCS bp check> 1 mildly elevated bp, 3 others normal. Asymptomatic, does has mild headache behind Rt eye that improves w/ ibuprofen. Has h/o PRES/subarachnoid hemorrhage/seizures/intubation in 2009. Extensively discussed s/s pre-e, reasons to seek care. Do not feel bp meds needed at this time. To return on Monday to recheck bp.   Meds: No orders of the defined types were placed in this encounter.   No orders of the defined types were placed in this encounter.   Return for MOnday for bp check w/ nurse.  Cheral MarkerKimberly R Naftula Donahue CNM, WHNP-BC 02/03/2018 3:00 PM

## 2018-02-03 NOTE — Telephone Encounter (Signed)
Patient states she has noticed increased swelling in her hands and feet and is concerned.  Informed patient that swelling post op is normal and may actually get worse before it gets better.  States she also has noticed a "knot" in her upper thigh.  Concerned she has a DVT.  No pain, warmth, discoloration noted per patient.  Will put on tech schedule for BP check per patient request.

## 2018-02-10 ENCOUNTER — Encounter: Payer: Self-pay | Admitting: Obstetrics & Gynecology

## 2018-02-10 ENCOUNTER — Ambulatory Visit (INDEPENDENT_AMBULATORY_CARE_PROVIDER_SITE_OTHER): Payer: Medicaid Other | Admitting: Obstetrics & Gynecology

## 2018-02-10 VITALS — BP 172/103 | HR 106 | Ht <= 58 in | Wt 125.0 lb

## 2018-02-10 DIAGNOSIS — Z0130 Encounter for examination of blood pressure without abnormal findings: Secondary | ICD-10-CM

## 2018-02-10 DIAGNOSIS — O165 Unspecified maternal hypertension, complicating the puerperium: Secondary | ICD-10-CM | POA: Diagnosis not present

## 2018-02-10 MED ORDER — TRIAMTERENE-HCTZ 37.5-25 MG PO CAPS: 1.0000 | ORAL_CAPSULE | Freq: Every day | ORAL | 2 refills | Status: DC

## 2018-02-10 MED ORDER — AMLODIPINE BESYLATE 10 MG PO TABS: 10.0000 mg | ORAL_TABLET | Freq: Every day | ORAL | 2 refills | Status: DC

## 2018-02-10 NOTE — Progress Notes (Signed)
Chief Complaint  Patient presents with  . Wound Check      36 y.o. [id number] No LMP recorded. The current method of family planning is tubal ligation.  Outpatient Encounter Medications as of 02/10/2018  Medication Sig  . docusate sodium (COLACE) 100 MG capsule Take 1 capsule (100 mg total) by mouth daily as needed.  Marland Kitchen ibuprofen (ADVIL,MOTRIN) 800 MG tablet Take 1 tablet (800 mg total) by mouth every 6 (six) hours as needed for fever, headache, mild pain, moderate pain or cramping.  Marland Kitchen oxyCODONE (OXY IR/ROXICODONE) 5 MG immediate release tablet Take 1 tablet (5 mg total) by mouth every 4 (four) hours as needed (pain scale 4-7).  Marland Kitchen amLODipine (NORVASC) 10 MG tablet Take 1 tablet (10 mg total) by mouth daily.  . Prenatal Vit-Fe Fumarate-FA (PNV PRENATAL PLUS MULTIVITAMIN) 27-1 MG TABS Take 1 tablet by mouth daily. (Patient not taking: Reported on 02/10/2018)  . triamterene-hydrochlorothiazide (DYAZIDE) 37.5-25 MG capsule Take 1 each (1 capsule total) by mouth daily.   No facility-administered encounter medications on file as of 02/10/2018.     Subjective Pt is 12 days post op from primary C section in for BP check blood pressure today 173/103, pt is asymptomatic no headache or visual changes  Past Medical History:  Diagnosis Date  . Abnormal Pap smear   . BV (bacterial vaginosis) 08/28/2012  . Diabetes mellitus without complication (HCC)   . Gestational diabetes    glyburide  . H/O abnormal Pap smear   . HSV-2 infection   . Seizures (HCC)    Had a bleed in her brain, after childbirth in 2009  . Unplanned wanted pregnancy 11/21/2012  . Vaginal Pap smear, abnormal     Past Surgical History:  Procedure Laterality Date  . CESAREAN SECTION    . CESAREAN SECTION WITH BILATERAL TUBAL LIGATION N/A 01/31/2018   Procedure: CESAREAN SECTION WITH BILATERAL TUBAL LIGATION;  Surgeon: Hermina Staggers, MD;  Location: [facility] BIRTHING SUITES;  Service: Obstetrics;  Laterality: N/A;  . NO PAST  SURGERIES      OB History    Gravida  9   Para  8   Term  8   Preterm      AB  1   Living  8     SAB  0   TAB  1   Ectopic      Multiple  0   Live Births  8           No Known Allergies  Social History   Socioeconomic History  . Marital status: Widowed    Spouse name: Not on file  . Number of children: Not on file  . Years of education: Not on file  . Highest education level: Not on file  Occupational History  . Not on file  Social Needs  . Financial resource strain: Not on file  . Food insecurity:    Worry: Not on file    Inability: Not on file  . Transportation needs:    Medical: Not on file    Non-medical: Not on file  Tobacco Use  . Smoking status: Never Smoker  . Smokeless tobacco: Never Used  Substance and Sexual Activity  . Alcohol use: No    Frequency: Never    Comment: occ  . Drug use: No  . Sexual activity: Not Currently    Birth control/protection: None  Lifestyle  . Physical activity:    Days per week: Not on file  Minutes per session: Not on file  . Stress: Not on file  Relationships  . Social connections:    Talks on phone: Not on file    Gets together: Not on file    Attends religious service: Not on file    Active member of club or organization: Not on file    Attends meetings of clubs or organizations: Not on file    Relationship status: Not on file  Other Topics Concern  . Not on file  Social History Narrative  . Not on file    Family History  Problem Relation Age of Onset  . COPD Maternal Grandmother   . COPD Mother     Medications:       Current Outpatient Medications:  .  docusate sodium (COLACE) 100 MG capsule, Take 1 capsule (100 mg total) by mouth daily as needed., Disp: 30 capsule, Rfl: 2 .  ibuprofen (ADVIL,MOTRIN) 800 MG tablet, Take 1 tablet (800 mg total) by mouth every 6 (six) hours as needed for fever, headache, mild pain, moderate pain or cramping., Disp: 30 tablet, Rfl: 0 .  oxyCODONE (OXY  IR/ROXICODONE) 5 MG immediate release tablet, Take 1 tablet (5 mg total) by mouth every 4 (four) hours as needed (pain scale 4-7)., Disp: 30 tablet, Rfl: 0 .  amLODipine (NORVASC) 10 MG tablet, Take 1 tablet (10 mg total) by mouth daily., Disp: 30 tablet, Rfl: 2 .  Prenatal Vit-Fe Fumarate-FA (PNV PRENATAL PLUS MULTIVITAMIN) 27-1 MG TABS, Take 1 tablet by mouth daily. (Patient not taking: Reported on 02/10/2018), Disp: 30 tablet, Rfl: 11 .  triamterene-hydrochlorothiazide (DYAZIDE) 37.5-25 MG capsule, Take 1 each (1 capsule total) by mouth daily., Disp: 30 capsule, Rfl: 2  Objective Blood pressure (!) 172/103, pulse (!) 106, height 4\' 10"  (1.473 m), weight 125 lb (56.7 kg), not currently breastfeeding.  Gen WDWN NAD inision clean dry intact  Pertinent ROS   Labs or studies     Impression Diagnoses this Encounter::   ICD-10-CM   1. Postpartum hypertension O16.5   2. BP check Z01.30     Established relevant diagnosis(es):   Plan/Recommendations: Meds ordered this encounter  Medications  . amLODipine (NORVASC) 10 MG tablet    Sig: Take 1 tablet (10 mg total) by mouth daily.    Dispense:  30 tablet    Refill:  2  . triamterene-hydrochlorothiazide (DYAZIDE) 37.5-25 MG capsule    Sig: Take 1 each (1 capsule total) by mouth daily.    Dispense:  30 capsule    Refill:  2    Labs or Scans Ordered: No orders of the defined types were placed in this encounter.   Management:: Begin norvasc and dyazide for hypertension  Follow up Return in about 1 week (around 02/17/2018) for Follow up, with Dr Despina Hidden.     1  Face to face time:  15 minutes  Greater than 50% of the visit time was spent in counseling and coordination of care with the patient.  The summary and outline of the counseling and care coordination is summarized in the note above.   All questions were answered.

## 2018-02-11 ENCOUNTER — Inpatient Hospital Stay (HOSPITAL_COMMUNITY): Admit: 2018-02-11 | Payer: Self-pay | Admitting: Obstetrics and Gynecology

## 2018-02-11 ENCOUNTER — Inpatient Hospital Stay (HOSPITAL_COMMUNITY)
Admission: AD | Admit: 2018-02-11 | Discharge: 2018-02-11 | Disposition: A | Discharge status: Home / Self Care | Payer: Medicaid Other | Source: Ambulatory Visit | Attending: Obstetrics and Gynecology | Admitting: Obstetrics and Gynecology

## 2018-02-11 ENCOUNTER — Encounter (HOSPITAL_COMMUNITY): Payer: Self-pay

## 2018-02-11 DIAGNOSIS — G4489 Other headache syndrome: Secondary | ICD-10-CM | POA: Insufficient documentation

## 2018-02-11 DIAGNOSIS — R51 Headache: Secondary | ICD-10-CM | POA: Diagnosis present

## 2018-02-11 DIAGNOSIS — E119 Type 2 diabetes mellitus without complications: Secondary | ICD-10-CM | POA: Insufficient documentation

## 2018-02-11 DIAGNOSIS — Z79899 Other long term (current) drug therapy: Secondary | ICD-10-CM | POA: Insufficient documentation

## 2018-02-11 DIAGNOSIS — O165 Unspecified maternal hypertension, complicating the puerperium: Secondary | ICD-10-CM | POA: Insufficient documentation

## 2018-02-11 LAB — URINALYSIS, ROUTINE W REFLEX MICROSCOPIC
Bilirubin Urine: NEGATIVE
GLUCOSE, UA: NEGATIVE mg/dL
Ketones, ur: NEGATIVE mg/dL
NITRITE: NEGATIVE
PROTEIN: NEGATIVE mg/dL
SPECIFIC GRAVITY, URINE: 1.006 (ref 1.005–1.030)
pH: 7 (ref 5.0–8.0)

## 2018-02-11 LAB — COMPREHENSIVE METABOLIC PANEL
ALBUMIN: 3.1 g/dL — AB (ref 3.5–5.0)
ALK PHOS: 137 U/L — AB (ref 38–126)
ALT: 17 U/L (ref 0–44)
AST: 16 U/L (ref 15–41)
Anion gap: 11 (ref 5–15)
BILIRUBIN TOTAL: 0.4 mg/dL (ref 0.3–1.2)
BUN: 10 mg/dL (ref 6–20)
CALCIUM: 8.9 mg/dL (ref 8.9–10.3)
CO2: 23 mmol/L (ref 22–32)
CREATININE: 0.64 mg/dL (ref 0.44–1.00)
Chloride: 103 mmol/L (ref 98–111)
GFR calc Af Amer: >60 mL/min (ref >60)
GLUCOSE: 111 mg/dL — AB (ref 70–99)
Potassium: 4 mmol/L (ref 3.5–5.1)
Sodium: 137 mmol/L (ref 135–145)
TOTAL PROTEIN: 6.9 g/dL (ref 6.5–8.1)

## 2018-02-11 LAB — CBC
HEMATOCRIT: 29.3 % — AB (ref 36.0–46.0)
HEMOGLOBIN: 9.3 g/dL — AB (ref 12.0–15.0)
MCH: 26.8 pg (ref 26.0–34.0)
MCHC: 31.7 g/dL (ref 30.0–36.0)
MCV: 84.4 fL (ref 78.0–100.0)
Platelets: 596 10*3/uL — ABNORMAL HIGH (ref 150–400)
RBC: 3.47 MIL/uL — ABNORMAL LOW (ref 3.87–5.11)
RDW: 13.5 % (ref 11.5–15.5)
WBC: 10 10*3/uL (ref 4.0–10.5)

## 2018-02-11 LAB — PROTEIN / CREATININE RATIO, URINE
Creatinine, Urine: 30 mg/dL
Protein Creatinine Ratio: 0.27 mg/mg{Cre} — ABNORMAL HIGH (ref 0.00–0.15)
Total Protein, Urine: 8 mg/dL

## 2018-02-11 MED ORDER — CYCLOBENZAPRINE HCL 10 MG PO TABS: 10.0000 mg | ORAL_TABLET | Freq: Two times a day (BID) | ORAL | 0 refills | Status: DC | PRN

## 2018-02-11 MED ORDER — CYCLOBENZAPRINE HCL 10 MG PO TABS
10.0000 mg | ORAL_TABLET | Freq: Once | ORAL | Status: DC
  Filled 2018-02-11: qty 1

## 2018-02-11 MED ORDER — BUTALBITAL-APAP-CAFFEINE 50-325-40 MG PO TABS: 1.0000 | ORAL_TABLET | Freq: Four times a day (QID) | ORAL | 0 refills | Status: AC | PRN

## 2018-02-11 MED ORDER — ACETAMINOPHEN 500 MG PO TABS
1000.0000 mg | ORAL_TABLET | Freq: Once | ORAL | Status: AC
  Administered 2018-02-11: 1000 mg via ORAL
  Filled 2018-02-11: qty 2

## 2018-02-11 NOTE — MAU Note (Signed)
PT was seen 9/6 post csect office visit. BP was noted to be in severe range and so was put on Norvasc and  She had started BP meds  on Friday.  After she got home Friday she started getting a headache, rating pain 5-6/10.  Took 1 Tylenol, which did not help.  She denies vision change, RUQ pain or swelling.

## 2018-02-11 NOTE — Discharge Instructions (Signed)
Preeclampsia and Eclampsia °Preeclampsia is a serious condition that develops only during pregnancy. It is also called toxemia of pregnancy. This condition causes high blood pressure along with other symptoms, such as swelling and headaches. These symptoms may develop as the condition gets worse. Preeclampsia may occur at 20 weeks of pregnancy or later. °Diagnosing and treating preeclampsia early is very important. If not treated early, it can cause serious problems for you and your baby. One problem it can lead to is eclampsia, which is a condition that causes muscle jerking or shaking (convulsions or seizures) in the mother. Delivering your baby is the best treatment for preeclampsia or eclampsia. Preeclampsia and eclampsia symptoms usually go away after your baby is born. °What are the causes? °The cause of preeclampsia is not known. °What increases the risk? °The following risk factors make you more likely to develop preeclampsia: °· Being pregnant for the first time. °· Having had preeclampsia during a past pregnancy. °· Having a family history of preeclampsia. °· Having high blood pressure. °· Being pregnant with twins or triplets. °· Being 35 or older. °· Being African-American. °· Having kidney disease or diabetes. °· Having medical conditions such as lupus or blood diseases. °· Being very overweight (obese). ° °What are the signs or symptoms? °The earliest signs of preeclampsia are: °· High blood pressure. °· Increased protein in your urine. Your health care provider will check for this at every visit before you give birth (prenatal visit). ° °Other symptoms that may develop as the condition gets worse include: °· Severe headaches. °· Sudden weight gain. °· Swelling of the hands, face, legs, and feet. °· Nausea and vomiting. °· Vision problems, such as blurred or double vision. °· Numbness in the face, arms, legs, and feet. °· Urinating less than usual. °· Dizziness. °· Slurred speech. °· Abdominal pain,  especially upper abdominal pain. °· Convulsions or seizures. ° °Symptoms generally go away after giving birth. °How is this diagnosed? °There are no screening tests for preeclampsia. Your health care provider will ask you about symptoms and check for signs of preeclampsia during your prenatal visits. You may also have tests that include: °· Urine tests. °· Blood tests. °· Checking your blood pressure. °· Monitoring your baby’s heart rate. °· Ultrasound. ° °How is this treated? °You and your health care provider will determine the treatment approach that is best for you. Treatment may include: °· Having more frequent prenatal exams to check for signs of preeclampsia, if you have an increased risk for preeclampsia. °· Bed rest. °· Reducing how much salt (sodium) you eat. °· Medicine to lower your blood pressure. °· Staying in the hospital, if your condition is severe. There, treatment will focus on controlling your blood pressure and the amount of fluids in your body (fluid retention). °· You may need to take medicine (magnesium sulfate) to prevent seizures. This medicine may be given as an injection or through an IV tube. °· Delivering your baby early, if your condition gets worse. You may have your labor started with medicine (induced), or you may have a cesarean delivery. ° °Follow these instructions at home: °Eating and drinking ° °· Drink enough fluid to keep your urine clear or pale yellow. °· Eat a healthy diet that is low in sodium. Do not add salt to your food. Check nutrition labels to see how much sodium a food or beverage contains. °· Avoid caffeine. °Lifestyle °· Do not use any products that contain nicotine or tobacco, such as cigarettes   and e-cigarettes. If you need help quitting, ask your health care provider. °· Do not use alcohol or drugs. °· Avoid stress as much as possible. Rest and get plenty of sleep. °General instructions °· Take over-the-counter and prescription medicines only as told by your  health care provider. °· When lying down, lie on your side. This keeps pressure off of your baby. °· When sitting or lying down, raise (elevate) your feet. Try putting some pillows underneath your lower legs. °· Exercise regularly. Ask your health care provider what kinds of exercise are best for you. °· Keep all follow-up and prenatal visits as told by your health care provider. This is important. °How is this prevented? °To prevent preeclampsia or eclampsia from developing during another pregnancy: °· Get proper medical care during pregnancy. Your health care provider may be able to prevent preeclampsia or diagnose and treat it early. °· Your health care provider may have you take a low-dose aspirin or a calcium supplement during your next pregnancy. °· You may have tests of your blood pressure and kidney function after giving birth. °· Maintain a healthy weight. Ask your health care provider for help managing weight gain during pregnancy. °· Work with your health care provider to manage any long-term (chronic) health conditions you have, such as diabetes or kidney problems. ° °Contact a health care provider if: °· You gain more weight than expected. °· You have headaches. °· You have nausea or vomiting. °· You have abdominal pain. °· You feel dizzy or light-headed. °Get help right away if: °· You develop sudden or severe swelling anywhere in your body. This usually happens in the legs. °· You gain 5 lbs (2.3 kg) or more during one week. °· You have severe: °? Abdominal pain. °? Headaches. °? Dizziness. °? Vision problems. °? Confusion. °? Nausea or vomiting. °· You have a seizure. °· You have trouble moving any part of your body. °· You develop numbness in any part of your body. °· You have trouble speaking. °· You have any abnormal bleeding. °· You pass out. °This information is not intended to replace advice given to you by your health care provider. Make sure you discuss any questions you have with your health  care provider. °Document Released: 05/21/2000 Document Revised: 01/20/2016 Document Reviewed: 12/29/2015 °Elsevier Interactive Patient Education © 2018 [employer] ° °

## 2018-02-11 NOTE — MAU Provider Note (Signed)
History     CSN: [id number]  Arrival date and time: 02/11/18 3559   First Provider Initiated Contact with Patient 02/11/18 0058      Chief Complaint  Patient presents with  . Headache  . Hypertension   [name] is a 37 y.o. [id number] who is 11 days S/P C-S. She is here today with a headache. She was seen in the office on 8/30 and 9/6 for blood pressure check. She had elevated blood pressure on both visits. On 9/6 her B/P was severe range 170s/100s. She was started on blood pressure medication. She reports that she has been taking those medications. However later in the day after her appointment she developed a headache that has not resolved. She has tried tylenol. She has a hx of PRES, Eclampsia, and SAH in 2009 after the birth of her child. She has not had any other blood pressure complications with any of there other pregnancies.   Headache   Pertinent negatives include no fever, nausea or vomiting. Her past medical history is significant for hypertension.  Hypertension  Associated symptoms include headaches. Pertinent negatives include no chest pain or shortness of breath.    OB History    Gravida  9   Para  8   Term  8   Preterm      AB  1   Living  8     SAB  0   TAB  1   Ectopic      Multiple  0   Live Births  8           Past Medical History:  Diagnosis Date  . Abnormal Pap smear   . BV (bacterial vaginosis) 08/28/2012  . Diabetes mellitus without complication (HCC)   . Gestational diabetes    glyburide  . H/O abnormal Pap smear   . HSV-2 infection   . Seizures (HCC)    Had a bleed in her brain, after childbirth in 2009  . Unplanned wanted pregnancy 11/21/2012  . Vaginal Pap smear, abnormal     Past Surgical History:  Procedure Laterality Date  . CESAREAN SECTION    . CESAREAN SECTION WITH BILATERAL TUBAL LIGATION N/A 01/31/2018   Procedure: CESAREAN SECTION WITH BILATERAL TUBAL LIGATION;  Surgeon: Hermina Staggers, MD;  Location: [facility]  BIRTHING SUITES;  Service: Obstetrics;  Laterality: N/A;  . NO PAST SURGERIES      Family History  Problem Relation Age of Onset  . COPD Maternal Grandmother   . COPD Mother     Social History   Tobacco Use  . Smoking status: Never Smoker  . Smokeless tobacco: Never Used  Substance Use Topics  . Alcohol use: No    Frequency: Never    Comment: occ  . Drug use: No    Allergies: No Known Allergies  Medications Prior to Admission  Medication Sig Dispense Refill Last Dose  . amLODipine (NORVASC) 10 MG tablet Take 1 tablet (10 mg total) by mouth daily. 30 tablet 2 02/10/2018 at Unknown time  . triamterene-hydrochlorothiazide (DYAZIDE) 37.5-25 MG capsule Take 1 each (1 capsule total) by mouth daily. 30 capsule 2 02/10/2018 at Unknown time  . docusate sodium (COLACE) 100 MG capsule Take 1 capsule (100 mg total) by mouth daily as needed. 30 capsule 2 Unknown at Unknown time  . ibuprofen (ADVIL,MOTRIN) 800 MG tablet Take 1 tablet (800 mg total) by mouth every 6 (six) hours as needed for fever, headache, mild pain, moderate pain or cramping.  30 tablet 0 Unknown at Unknown time  . oxyCODONE (OXY IR/ROXICODONE) 5 MG immediate release tablet Take 1 tablet (5 mg total) by mouth every 4 (four) hours as needed (pain scale 4-7). 30 tablet 0 Unknown at Unknown time  . Prenatal Vit-Fe Fumarate-FA (PNV PRENATAL PLUS MULTIVITAMIN) 27-1 MG TABS Take 1 tablet by mouth daily. (Patient not taking: Reported on 02/10/2018) 30 tablet 11 Unknown at Unknown time    Review of Systems  Constitutional: Negative for chills and fever.  Respiratory: Negative for shortness of breath.   Cardiovascular: Negative for chest pain.  Gastrointestinal: Negative for nausea and vomiting.  Neurological: Positive for headaches.   Physical Exam   Blood pressure (!) 156/93, pulse (!) 114, temperature 98.3 F (36.8 C), temperature source Oral, resp. rate 18, height 4\' 10"  (1.473 m), weight 54.4 kg, SpO2 98 %, not currently  breastfeeding.  Physical Exam  Nursing note and vitals reviewed. Constitutional: She is oriented to person, place, and time. She appears well-developed and well-nourished. No distress.  HENT:  Head: Normocephalic.  Cardiovascular: Normal rate.  Respiratory: Effort normal.  GI: Soft. There is no tenderness.  Neurological: She is alert and oriented to person, place, and time. She has normal reflexes. She exhibits normal muscle tone (no clonus ).  Skin: Skin is warm and dry.  Psychiatric: She has a normal mood and affect.   Results for orders placed or performed during the hospital encounter of 02/11/18 (from the past 24 hour(s))  Urinalysis, Routine w reflex microscopic     Status: Abnormal   Collection Time: 02/11/18 12:56 AM  Result Value Ref Range   Color, Urine STRAW (A) YELLOW   APPearance CLEAR CLEAR   Specific Gravity, Urine 1.006 1.005 - 1.030   pH 7.0 5.0 - 8.0   Glucose, UA NEGATIVE NEGATIVE mg/dL   Hgb urine dipstick LARGE (A) NEGATIVE   Bilirubin Urine NEGATIVE NEGATIVE   Ketones, ur NEGATIVE NEGATIVE mg/dL   Protein, ur NEGATIVE NEGATIVE mg/dL   Nitrite NEGATIVE NEGATIVE   Leukocytes, UA LARGE (A) NEGATIVE   RBC / HPF 11-20 0 - 5 RBC/hpf   WBC, UA 21-50 0 - 5 WBC/hpf   Bacteria, UA RARE (A) NONE SEEN   Squamous Epithelial / LPF 0-5 0 - 5   Mucus PRESENT   Protein / creatinine ratio, urine     Status: Abnormal   Collection Time: 02/11/18 12:56 AM  Result Value Ref Range   Creatinine, Urine 30.00 mg/dL   Total Protein, Urine 8 mg/dL   Protein Creatinine Ratio 0.27 (H) 0.00 - 0.15 mg/mg[Cre]  CBC     Status: Abnormal   Collection Time: 02/11/18  1:07 AM  Result Value Ref Range   WBC 10.0 4.0 - 10.5 K/uL   RBC 3.47 (L) 3.87 - 5.11 MIL/uL   Hemoglobin 9.3 (L) 12.0 - 15.0 g/dL   HCT 16.1 (L) 09.6 - 04.5 %   MCV 84.4 78.0 - 100.0 fL   MCH 26.8 26.0 - 34.0 pg   MCHC 31.7 30.0 - 36.0 g/dL   RDW 40.9 81.1 - 91.4 %   Platelets 596 (H) 150 - 400 K/uL  Comprehensive  metabolic panel     Status: Abnormal   Collection Time: 02/11/18  1:07 AM  Result Value Ref Range   Sodium 137 135 - 145 mmol/L   Potassium 4.0 3.5 - 5.1 mmol/L   Chloride 103 98 - 111 mmol/L   CO2 23 22 - 32 mmol/L   Glucose, Bld  111 (H) 70 - 99 mg/dL   BUN 10 6 - 20 mg/dL   Creatinine, Ser 1.61 0.44 - 1.00 mg/dL   Calcium 8.9 8.9 - 09.6 mg/dL   Total Protein 6.9 6.5 - 8.1 g/dL   Albumin 3.1 (L) 3.5 - 5.0 g/dL   AST 16 15 - 41 U/L   ALT 17 0 - 44 U/L   Alkaline Phosphatase 137 (H) 38 - 126 U/L   Total Bilirubin 0.4 0.3 - 1.2 mg/dL   GFR calc non Af Amer >60 >60 mL/min   GFR calc Af Amer >60 >60 mL/min   Anion gap 11 5 - 15    MAU Course  Procedures  MDM 2:45 AM Consult with Dr. Jolayne Panther. With normal labs and B/P normal at this time plan will be to DC home patient with fioricet for headache. Patient can try this medication at home as she is refusing anything here that could make her sleepy. If she tries this and her headache does not improve patient is given strict instructions to return to MAU and at that time we should consider CT scan given her history.   Assessment and Plan   1. Other headache syndrome   2. Hypertension, postpartum condition or complication    DC home Comfort measures reviewed  Pre-eclampsia warning signs reviewed  RX: fioricet PRN, Flexeril PRN Return to MAU as needed FU with OB as planned  Follow-up Information    THE [facility] [place] MATERNITY ADMISSIONS Follow up.   Why:  return if headche persists or worsens Contact information: [address] [id number] mc [place] [address] [phone]           Thressa Sheller 02/11/2018, 1:04 AM

## 2018-02-15 ENCOUNTER — Inpatient Hospital Stay (HOSPITAL_COMMUNITY)
Admission: RE | Admit: 2018-02-15 | Payer: Medicaid Other | Source: Ambulatory Visit | Admitting: Obstetrics & Gynecology

## 2018-02-17 ENCOUNTER — Other Ambulatory Visit: Payer: Self-pay

## 2018-02-17 ENCOUNTER — Encounter: Payer: Self-pay | Admitting: Obstetrics & Gynecology

## 2018-02-17 ENCOUNTER — Ambulatory Visit (INDEPENDENT_AMBULATORY_CARE_PROVIDER_SITE_OTHER): Payer: Medicaid Other | Admitting: Obstetrics & Gynecology

## 2018-02-17 VITALS — BP 125/85 | HR 119 | Ht <= 58 in | Wt 116.0 lb

## 2018-02-17 DIAGNOSIS — Z0130 Encounter for examination of blood pressure without abnormal findings: Secondary | ICD-10-CM | POA: Diagnosis not present

## 2018-02-17 NOTE — Progress Notes (Signed)
Blood pressure 125/85, pulse (!) 119, height 4\' 10"  (1.473 m), weight 116 lb (52.6 kg), not currently breastfeeding.  BP much better and non dependent swelling is improved  Take norvasc 5 mg(1/2 tablet) continue the dyazide  Follow up 4 weeks pp visit and stop both 3 days prior to that visit  Pt feels better as well  Return in about 4 weeks (around 03/17/2018) for post partum visit with kim or fran.  Lazaro ArmsLuther H Eure, MD 02/17/2018 10:03 AM

## 2018-03-09 ENCOUNTER — Other Ambulatory Visit: Payer: Self-pay

## 2018-03-09 ENCOUNTER — Ambulatory Visit (INDEPENDENT_AMBULATORY_CARE_PROVIDER_SITE_OTHER): Payer: Medicaid Other | Admitting: Advanced Practice Midwife

## 2018-03-09 ENCOUNTER — Encounter: Payer: Self-pay | Admitting: Advanced Practice Midwife

## 2018-03-09 MED ORDER — HYDROQUINONE 4 % EX CREA: TOPICAL_CREAM | Freq: Two times a day (BID) | CUTANEOUS | 0 refills | Status: DC

## 2018-03-09 NOTE — Patient Instructions (Signed)
Hydroquionone 2% twice a day  For melasma

## 2018-03-09 NOTE — Progress Notes (Signed)
[name] is a 37 y.o. who presents for a postpartum visit. She is 5 weeks postpartum following a low cervical transverse Cesarean section. I have fully reviewed the prenatal and intrapartum course. The delivery was at 37 gestational weeks.  Anesthesia: spinal. Postpartum course has been complicated by by GHTN, persitant.  She was placed on meds, stopped taking them 3 days ago per inst. Baby's course has been uneventful. Baby is feeding by bottle. Bleeding: staining only. Bowel function is normal. Bladder function is normal. Patient is not sexually active. Contraception method is tubal ligation. Postpartum depression screening: negative.   Current Outpatient Medications:  .  amLODipine (NORVASC) 10 MG tablet, Take 1 tablet (10 mg total) by mouth daily. (Patient not taking: Reported on 03/09/2018), Disp: 30 tablet, Rfl: 2 .  butalbital-acetaminophen-caffeine (FIORICET, ESGIC) 50-325-40 MG tablet, Take 1-2 tablets by mouth every 6 (six) hours as needed for headache. (Patient not taking: Reported on 03/09/2018), Disp: 20 tablet, Rfl: 0 .  cyclobenzaprine (FLEXERIL) 10 MG tablet, Take 1 tablet (10 mg total) by mouth 2 (two) times daily as needed for muscle spasms. (Patient not taking: Reported on 03/09/2018), Disp: 20 tablet, Rfl: 0 .  triamterene-hydrochlorothiazide (DYAZIDE) 37.5-25 MG capsule, Take 1 each (1 capsule total) by mouth daily. (Patient not taking: Reported on 03/09/2018), Disp: 30 capsule, Rfl: 2  Review of Systems   Constitutional: Negative for fever and chills Eyes: Negative for visual disturbances Skin:  Melasma  Respiratory: Negative for shortness of breath, dyspnea Cardiovascular: Negative for chest pain or palpitations  Gastrointestinal: Negative for vomiting, diarrhea and constipation Genitourinary: Negative for dysuria and urgency Musculoskeletal: Negative for back pain, joint pain, myalgias  Neurological: Negative for dizziness and headaches    Objective:    There were no  vitals filed for this visit. General:  alert, cooperative and no distress   Breasts:  negative  Lungs: Normal respiratory effort  Heart:  regular rate and rhythm  Abdomen: Soft, nontender, incision well healed    Vulva:  normal  Vagina: normal vagina  Cervix:  closed  Corpus: Well involuted     Rectal Exam: no hemorrhoids        Assessment:    normal postpartum exam. GHTN, persistant;  Restart Norvasc 5 mg and Diazide   Plan:   1. Contraception: tubal ligation 2. Follow up in: 4 weeks or as needed. Stop BP meds 3 days before appt. Hydroquinolone for melasma

## 2018-04-04 ENCOUNTER — Ambulatory Visit: Payer: Medicaid Other

## 2018-04-04 VITALS — BP 145/92 | HR 92 | Ht <= 58 in | Wt 117.0 lb

## 2018-04-04 DIAGNOSIS — Z0130 Encounter for examination of blood pressure without abnormal findings: Secondary | ICD-10-CM

## 2018-04-04 NOTE — Progress Notes (Signed)
Pt here for blood pressure check 145/92 pulse 100. Spoke Dr Despina Hidden about reading. Said B/P good. Pad CMA

## 2018-04-11 ENCOUNTER — Encounter (HOSPITAL_COMMUNITY): Payer: Self-pay

## 2020-03-11 ENCOUNTER — Ambulatory Visit (INDEPENDENT_AMBULATORY_CARE_PROVIDER_SITE_OTHER): Payer: Medicaid Other | Admitting: Women's Health

## 2020-03-11 ENCOUNTER — Encounter: Payer: Self-pay | Admitting: Women's Health

## 2020-03-11 ENCOUNTER — Other Ambulatory Visit (HOSPITAL_COMMUNITY)
Admission: RE | Admit: 2020-03-11 | Discharge: 2020-03-11 | Disposition: A | Discharge status: Home / Self Care | Payer: Medicaid Other | Source: Ambulatory Visit | Attending: Obstetrics & Gynecology | Admitting: Obstetrics & Gynecology

## 2020-03-11 VITALS — BP 130/86 | HR 97 | Ht <= 58 in | Wt 121.0 lb

## 2020-03-11 DIAGNOSIS — L292 Pruritus vulvae: Secondary | ICD-10-CM

## 2020-03-11 MED ORDER — FLUCONAZOLE 150 MG PO TABS: 150.0000 mg | ORAL_TABLET | Freq: Once | ORAL | 0 refills | Status: AC

## 2020-03-11 NOTE — Progress Notes (Signed)
   GYN VISIT Patient name: [name] MRN [id number]  Date of birth: [birth year] Chief Complaint:   discuss yeast infection (itching)  History of Present Illness:   [name] is a 39 y.o. [phone] Caucasian female being seen today for vulvar itching while coming off of period. No odor or abnormal d/c.  BPs have stayed 130s/80s since having last baby. 2 periods in Sept- at beginning and end of month.     Depression screen [facility] 2/9 12/07/2017 07/12/2017  Decreased Interest 0 0  Down, Depressed, Hopeless 0 0  PHQ - 2 Score 0 0  Altered sleeping - 0  Tired, decreased energy - 1  Change in appetite - 1  Feeling bad or failure about yourself  - 0  Trouble concentrating - 0  Moving slowly or fidgety/restless - 0  Suicidal thoughts - 0  PHQ-9 Score - 2  Difficult doing work/chores - Not difficult at all    Patient's last menstrual period was 03/05/2020. The current method of family planning is tubal ligation.  Last pap 06/25/15. Results were:  normal Review of Systems:   Pertinent items are noted in HPI Denies fever/chills, dizziness, headaches, visual disturbances, fatigue, shortness of breath, chest pain, abdominal pain, vomiting, abnormal vaginal discharge/itching/odor/irritation, problems with periods, bowel movements, urination, or intercourse unless otherwise stated above.  Pertinent History Reviewed:  Reviewed past medical,surgical, social, obstetrical and family history.  Reviewed problem list, medications and allergies. Physical Assessment:   Vitals:   03/11/20 1355  BP: 130/86  Pulse: 97  Weight: 121 lb (54.9 kg)  Height: 4\' 10"  (1.473 m)  Body mass index is 25.29 kg/m.       Physical Examination:   General appearance: alert, well appearing, and in no distress  Mental status: alert, oriented to person, place, and time  Skin: warm & dry   Cardiovascular: normal heart rate noted  Respiratory: normal respiratory effort, no distress  Abdomen: soft, non-tender   Pelvic:  VULVA: normal appearing vulva with no masses, tenderness or lesions, VAGINA: normal appearing vagina with normal color and brown discharge, no lesions, CERVIX: normal appearing cervix without discharge or lesions  Extremities: no edema   Chaperone:    No results found for this or any previous visit (from the past 24 hour(s)).  Assessment & Plan:  1) Vulvar itching> CV swab, rx diflucan to take if +yeast  Meds:  Meds ordered this encounter  Medications  . fluconazole (DIFLUCAN) 150 MG tablet    Sig: Take 1 tablet (150 mg total) by mouth once for 1 dose. Take 1 pill now, may take 2nd pill in 3 days if needed    Dispense:  2 tablet    Refill:  0    Order Specific Question:   Supervising Provider    Answer:   Malachy Mood, LUTHER H [[id number]]    No orders of the defined types were placed in this encounter.   Return for 1st available, Pap & physical.  Despina Hidden CNM, [facility] 03/11/2020 2:24 PM

## 2020-03-12 ENCOUNTER — Telehealth: Payer: Self-pay

## 2020-03-12 NOTE — Telephone Encounter (Signed)
Pt calling for lab results from her 10/05 appointment. Pt stated that MyChart was down.

## 2020-03-12 NOTE — Telephone Encounter (Signed)
Pt aware swab results are not back. If anything comes back positive that she needs med for, it will be sent to [facility]. Pt voiced understanding. JSY

## 2020-03-13 LAB — CERVICOVAGINAL ANCILLARY ONLY
Bacterial Vaginitis (gardnerella): NEGATIVE
Candida Glabrata: NEGATIVE
Candida Vaginitis: POSITIVE — AB
Chlamydia: NEGATIVE
Comment: NEGATIVE
Comment: NEGATIVE
Comment: NEGATIVE
Comment: NEGATIVE
Comment: NEGATIVE
Comment: NORMAL
Neisseria Gonorrhea: NEGATIVE
Trichomonas: NEGATIVE

## 2020-04-07 ENCOUNTER — Other Ambulatory Visit: Payer: Medicaid Other | Admitting: Advanced Practice Midwife

## 2020-05-12 ENCOUNTER — Telehealth: Payer: Self-pay | Admitting: Adult Health

## 2020-05-12 ENCOUNTER — Other Ambulatory Visit: Payer: Self-pay | Admitting: Women's Health

## 2020-05-12 NOTE — Telephone Encounter (Signed)
Pt requesting refill on Valtrex  Please advise & notify pt   [facility]

## 2020-05-13 ENCOUNTER — Other Ambulatory Visit: Payer: Self-pay | Admitting: Women's Health

## 2020-05-13 MED ORDER — VALACYCLOVIR HCL 1 G PO TABS: 1000.0000 mg | ORAL_TABLET | Freq: Every day | ORAL | 3 refills | Status: DC

## 2020-05-15 NOTE — Telephone Encounter (Signed)
Rx ordered by K. Booker 05/13/2020

## 2020-06-02 DIAGNOSIS — R82998 Other abnormal findings in urine: Secondary | ICD-10-CM | POA: Diagnosis not present

## 2020-06-02 DIAGNOSIS — N390 Urinary tract infection, site not specified: Secondary | ICD-10-CM | POA: Diagnosis not present

## 2020-06-02 DIAGNOSIS — R300 Dysuria: Secondary | ICD-10-CM | POA: Diagnosis not present

## 2020-06-02 DIAGNOSIS — R350 Frequency of micturition: Secondary | ICD-10-CM | POA: Diagnosis not present

## 2021-06-12 ENCOUNTER — Other Ambulatory Visit: Payer: Self-pay

## 2021-06-12 ENCOUNTER — Ambulatory Visit
Admission: EM | Admit: 2021-06-12 | Discharge: 2021-06-12 | Disposition: A | Discharge status: Home / Self Care | Payer: Medicaid Other | Attending: Urgent Care | Admitting: Urgent Care

## 2021-06-12 ENCOUNTER — Ambulatory Visit: Admit: 2021-06-12 | Discharge status: Absent without leave | Payer: Self-pay

## 2021-06-12 DIAGNOSIS — R350 Frequency of micturition: Secondary | ICD-10-CM | POA: Diagnosis not present

## 2021-06-12 DIAGNOSIS — N3001 Acute cystitis with hematuria: Secondary | ICD-10-CM | POA: Diagnosis not present

## 2021-06-12 LAB — POCT URINALYSIS DIP (MANUAL ENTRY)
Bilirubin, UA: NEGATIVE
Glucose, UA: NEGATIVE mg/dL
Ketones, POC UA: NEGATIVE mg/dL
Nitrite, UA: NEGATIVE
Protein Ur, POC: NEGATIVE mg/dL
Spec Grav, UA: 1.025 (ref 1.010–1.025)
Urobilinogen, UA: 0.2 E.U./dL
pH, UA: 6 (ref 5.0–8.0)

## 2021-06-12 MED ORDER — NITROFURANTOIN MONOHYD MACRO 100 MG PO CAPS: 100.0000 mg | ORAL_CAPSULE | Freq: Two times a day (BID) | ORAL | 0 refills | Status: DC

## 2021-06-12 NOTE — ED Triage Notes (Signed)
Patient presents to Urgent Care with complaints of possible UTI. C/o of urinary urgency and odor to urine x 3 days ago.   Denies fever, hematuria, or abdominal pain.

## 2021-06-12 NOTE — ED Provider Notes (Signed)
[place]   MRN: [id number] DOB: 10/16/1980  Subjective:   [name] is a 41 y.o. female presenting for 3-day history of acute onset dysuria, urinary frequency, urinary urgency, malodorous and dark urine.  Patient admits that she never drinks water, drinks mostly soda.  She also has a history of diabetes.  She is not on insulin for this.  No fever, nausea, vomiting, pelvic or flank pain, vaginal discharge, hematuria.  No current facility-administered medications for this encounter.  Current Outpatient Medications:    amLODipine (NORVASC) 10 MG tablet, Take 1 tablet (10 mg total) by mouth daily. (Patient not taking: Reported on 03/09/2018), Disp: 30 tablet, Rfl: 2   cyclobenzaprine (FLEXERIL) 10 MG tablet, Take 1 tablet (10 mg total) by mouth 2 (two) times daily as needed for muscle spasms. (Patient not taking: Reported on 04/04/2018), Disp: 20 tablet, Rfl: 0   hydroquinone 4 % cream, Apply topically 2 (two) times daily. (Patient not taking: Reported on 04/04/2018), Disp: 28.35 g, Rfl: 0   triamterene-hydrochlorothiazide (DYAZIDE) 37.5-25 MG capsule, Take 1 each (1 capsule total) by mouth daily. (Patient not taking: Reported on 03/09/2018), Disp: 30 capsule, Rfl: 2   valACYclovir (VALTREX) 1000 MG tablet, Take 1 tablet (1,000 mg total) by mouth daily., Disp: 90 tablet, Rfl: 3   No Known Allergies  Past Medical History:  Diagnosis Date   Abnormal Pap smear    BV (bacterial vaginosis) 08/28/2012   Diabetes mellitus without complication ([place])    Gestational diabetes    glyburide   H/O abnormal Pap smear    HSV-2 infection    Seizures ([place])    Had a bleed in her brain, after childbirth in 2009   Unplanned wanted pregnancy 11/21/2012   Vaginal Pap smear, abnormal      Past Surgical History:  Procedure Laterality Date   CESAREAN SECTION     CESAREAN SECTION WITH BILATERAL TUBAL LIGATION N/A 01/31/2018   Procedure: CESAREAN SECTION WITH BILATERAL TUBAL LIGATION;   Surgeon: Chancy Milroy, MD;  Location: [place];  Service: Obstetrics;  Laterality: N/A;   NO PAST SURGERIES      Family History  Problem Relation Age of Onset   COPD Maternal Grandmother    COPD Mother     Social History   Tobacco Use   Smoking status: Never   Smokeless tobacco: Never  Vaping Use   Vaping Use: Never used  Substance Use Topics   Alcohol use: No    Comment: occ   Drug use: No    ROS   Objective:   Vitals: BP (!) 133/91 (BP Location: Right Arm)    Pulse 91    Temp 98.1 F (36.7 C) (Oral)    Resp 16    LMP 05/12/2021 (Approximate)    SpO2 97%   Physical Exam Constitutional:      General: She is not in acute distress.    Appearance: Normal appearance. She is well-developed. She is not ill-appearing, toxic-appearing or diaphoretic.  HENT:     Head: Normocephalic and atraumatic.     Nose: Nose normal.     Mouth/Throat:     Mouth: Mucous membranes are moist.     Pharynx: Oropharynx is clear.  Eyes:     General: No scleral icterus.       Right eye: No discharge.        Left eye: No discharge.     Extraocular Movements: Extraocular movements intact.     Conjunctiva/sclera: Conjunctivae normal.  Pupils: Pupils are equal, round, and reactive to light.  Cardiovascular:     Rate and Rhythm: Normal rate.  Pulmonary:     Effort: Pulmonary effort is normal.  Abdominal:     General: Bowel sounds are normal. There is no distension.     Palpations: Abdomen is soft. There is no mass.     Tenderness: There is no abdominal tenderness. There is no right CVA tenderness, left CVA tenderness, guarding or rebound.  Skin:    General: Skin is warm and dry.  Neurological:     General: No focal deficit present.     Mental Status: She is alert and oriented to person, place, and time.  Psychiatric:        Mood and Affect: Mood normal.        Behavior: Behavior normal.        Thought Content: Thought content normal.        Judgment: Judgment normal.     Results for orders placed or performed during the hospital encounter of 06/12/21 (from the past 24 hour(s))  POCT urinalysis dipstick     Status: Abnormal   Collection Time: 06/12/21  9:25 AM  Result Value Ref Range   Color, UA yellow yellow   Clarity, UA clear clear   Glucose, UA negative negative mg/dL   Bilirubin, UA negative negative   Ketones, POC UA negative negative mg/dL   Spec Grav, UA 1.025 1.010 - 1.025   Blood, UA moderate (A) negative   pH, UA 6.0 5.0 - 8.0   Protein Ur, POC negative negative mg/dL   Urobilinogen, UA 0.2 0.2 or 1.0 E.U./dL   Nitrite, UA Negative Negative   Leukocytes, UA Moderate (2+) (A) Negative    Assessment and Plan :   PDMP not reviewed this encounter.  1. Acute cystitis with hematuria   2. Urinary frequency    Start Macrobid to cover for acute cystitis, urine culture pending.  Recommended aggressive hydration, limiting urinary irritants.  If the urine culture is negative, patient is to discontinue the antibiotic as prescribed today.  Counseled patient on potential for adverse effects with medications prescribed/recommended today, ER and return-to-clinic precautions discussed, patient verbalized understanding.    Jaynee Eagles, [place] 06/12/21 [phone]

## 2021-06-12 NOTE — Discharge Instructions (Addendum)

## 2021-06-15 LAB — URINE CULTURE: Culture: >=100000 — AB

## 2021-06-22 ENCOUNTER — Telehealth: Payer: Self-pay | Admitting: Urgent Care

## 2021-06-22 MED ORDER — FLUCONAZOLE 150 MG PO TABS: 150.0000 mg | ORAL_TABLET | ORAL | 0 refills | Status: DC

## 2021-06-22 NOTE — Telephone Encounter (Signed)
Patient was prescribed an antibiotic course last week for an urinary tract infection.  Now she has a yeast infection.  We will be using Diflucan for an antibiotic associated yeast infection.

## 2022-01-21 ENCOUNTER — Other Ambulatory Visit (INDEPENDENT_AMBULATORY_CARE_PROVIDER_SITE_OTHER): Payer: Medicaid Other | Admitting: *Deleted

## 2022-01-21 ENCOUNTER — Other Ambulatory Visit (HOSPITAL_COMMUNITY)
Admission: RE | Admit: 2022-01-21 | Discharge: 2022-01-21 | Disposition: A | Discharge status: Home / Self Care | Payer: Medicaid Other | Source: Ambulatory Visit | Attending: Obstetrics & Gynecology | Admitting: Obstetrics & Gynecology

## 2022-01-21 DIAGNOSIS — N898 Other specified noninflammatory disorders of vagina: Secondary | ICD-10-CM | POA: Diagnosis not present

## 2022-01-21 NOTE — Progress Notes (Signed)
  NURSE VISIT- VAGINITIS/STD  SUBJECTIVE:  [name] is a 41 y.o. [id number] GYN patientfemale here for a vaginal swab for vaginitis screening, STD screen.  She reports the following symptoms:  vaginal itching  for 2 days. Has been on an antibiotic for tooth.  Denies abnormal vaginal bleeding, significant pelvic pain, fever, or UTI symptoms.  OBJECTIVE:  There were no vitals taken for this visit.  Appears well, in no apparent distress  ASSESSMENT: Vaginal swab for vaginitis screening & STD screening.   PLAN: Self-collected vaginal probe for Gonorrhea, Chlamydia, Trichomonas, Bacterial Vaginosis, Yeast sent to lab Treatment: to be determined once results are received Follow-up as needed if symptoms persist/worsen, or new symptoms develop  Malachy Mood  01/21/2022 10:36 AM

## 2022-01-25 ENCOUNTER — Telehealth: Payer: Self-pay

## 2022-01-25 ENCOUNTER — Other Ambulatory Visit: Payer: Self-pay | Admitting: Adult Health

## 2022-01-25 LAB — CERVICOVAGINAL ANCILLARY ONLY
Bacterial Vaginitis (gardnerella): POSITIVE — AB
Candida Glabrata: NEGATIVE
Candida Vaginitis: POSITIVE — AB
Chlamydia: NEGATIVE
Comment: NEGATIVE
Comment: NEGATIVE
Comment: NEGATIVE
Comment: NEGATIVE
Comment: NEGATIVE
Comment: NORMAL
Neisseria Gonorrhea: NEGATIVE
Trichomonas: NEGATIVE

## 2022-01-25 MED ORDER — FLUCONAZOLE 150 MG PO TABS: ORAL_TABLET | ORAL | 1 refills | Status: DC

## 2022-01-25 MED ORDER — METRONIDAZOLE 500 MG PO TABS: 500.0000 mg | ORAL_TABLET | Freq: Two times a day (BID) | ORAL | 0 refills | Status: DC

## 2022-01-25 NOTE — Progress Notes (Signed)
+  BV and yeast on vaginal swab, will rx flagyl and diflucan 

## 2022-01-25 NOTE — Telephone Encounter (Signed)
Patient would like something called in for a yeast infection.

## 2022-01-25 NOTE — Telephone Encounter (Signed)
Patient called and stated that she wants something called in.

## 2022-01-25 NOTE — Telephone Encounter (Signed)
Pt has itching and discharge. Pt done a swab on 01/21/22. We don't have results yet. Pt did try an OTC gel for yeast with no improvement. Pt was advised we need to wait and see what the swab results show. Pt hung up or either we got disconnected. JSY

## 2022-01-25 NOTE — Telephone Encounter (Signed)
Returned patient's call.  Informed medication will be sent in this afternoon. Pt verbalized understanding.

## 2022-06-18 ENCOUNTER — Telehealth: Payer: Self-pay

## 2022-06-18 NOTE — Telephone Encounter (Signed)
Mychart msg sent. AS, CMA 

## 2022-07-07 DIAGNOSIS — Z23 Encounter for immunization: Secondary | ICD-10-CM | POA: Diagnosis not present

## 2022-08-05 ENCOUNTER — Ambulatory Visit: Payer: Medicaid Other | Admitting: Family Medicine

## 2022-08-05 ENCOUNTER — Encounter: Payer: Self-pay | Admitting: Family Medicine

## 2022-08-05 VITALS — BP 132/82 | HR 83 | Ht <= 58 in | Wt 126.1 lb

## 2022-08-05 DIAGNOSIS — E7849 Other hyperlipidemia: Secondary | ICD-10-CM

## 2022-08-05 DIAGNOSIS — R7301 Impaired fasting glucose: Secondary | ICD-10-CM | POA: Diagnosis not present

## 2022-08-05 DIAGNOSIS — Z1159 Encounter for screening for other viral diseases: Secondary | ICD-10-CM

## 2022-08-05 DIAGNOSIS — E0789 Other specified disorders of thyroid: Secondary | ICD-10-CM | POA: Diagnosis not present

## 2022-08-05 DIAGNOSIS — M7711 Lateral epicondylitis, right elbow: Secondary | ICD-10-CM | POA: Diagnosis not present

## 2022-08-05 DIAGNOSIS — E559 Vitamin D deficiency, unspecified: Secondary | ICD-10-CM

## 2022-08-05 NOTE — Assessment & Plan Note (Signed)
Onset of symptoms 3 months ago while at work Patient works as a Radiation protection practitioner at work She reports soreness at the lateral epicondyle Symptom is worse with forearm activity No recent injury or trauma reported She rates soreness 4 out of 10 No numbness or tingling her forearm Encourage conservative management Education as follow Lateral epicondylitis -Activity modification (reduction or abstinence from inciting activities) -Counterforce bracing / elbow brace (compression sleeve) -Oral analgesics: acetaminophen, nonsteroidal antiinflammatory drugs (NSAIDs)  -Ice applied to affected epicondyle area after provocative activity

## 2022-08-05 NOTE — Patient Instructions (Addendum)
  I appreciate the opportunity to provide care to you today!    Follow up:  1 week for CPE and pap smear    Labs: please stop by the lab today to get your blood drawn (CBC, CMP, TSH, Lipid profile, HgA1c, Vit D)  Screening:  Hep C  Lateral epicondylitis -Activity modification (reduction or abstinence from inciting activities) -Counterforce bracing / elbow brace (compression sleeve) -Oral analgesics: acetaminophen, nonsteroidal antiinflammatory drugs (NSAIDs)  -Ice applied to affected epicondyle area after provocative activity  Please schedule your annual eye exam    Please continue to a heart-healthy diet and increase your physical activities. Try to exercise for 76mns at least five times a week.      It was a pleasure to see you and I look forward to continuing to work together on your health and well-being. Please do not hesitate to call the office if you need care or have questions about your care.   Have a wonderful day and week. With Gratitude, GAlvira MondayMSN, FNP-BC

## 2022-08-05 NOTE — Progress Notes (Signed)
New Patient Office Visit  Subjective:  Patient ID: [name], female    DOB: 1980-08-20  Age: 42 y.o. MRN: [id number]  CC:  Chief Complaint  Patient presents with   Establish Care    New patient, establishing pcp. Pt reports right elbow pain since 06/04/2022. Unsure where it came from.      HPI [name] is a 42 y.o. female with past medical history of gestational diabetes, HSV-2, and right breast mass presents for establishing care. For the details of today's visit, please refer to the assessment and plan.     Past Medical History:  Diagnosis Date   Abnormal Pap smear    BV (bacterial vaginosis) 08/28/2012   Diabetes mellitus without complication ([place])    Gestational diabetes    glyburide   H/O abnormal Pap smear    HSV-2 infection    Seizures ([place])    Had a bleed in her brain, after childbirth in 2009   Unplanned wanted pregnancy 11/21/2012   Vaginal Pap smear, abnormal     Past Surgical History:  Procedure Laterality Date   CESAREAN SECTION     CESAREAN SECTION WITH BILATERAL TUBAL LIGATION N/A 01/31/2018   Procedure: CESAREAN SECTION WITH BILATERAL TUBAL LIGATION;  Surgeon: Chancy Milroy, MD;  Location: [place];  Service: Obstetrics;  Laterality: N/A;   NO PAST SURGERIES      Family History  Problem Relation Age of Onset   COPD Maternal Grandmother    COPD Mother     Social History   Socioeconomic History   Marital status: Widowed    Spouse name: Not on file   Number of children: Not on file   Years of education: Not on file   Highest education level: Not on file  Occupational History   Not on file  Tobacco Use   Smoking status: Never   Smokeless tobacco: Never  Vaping Use   Vaping Use: Never used  Substance and Sexual Activity   Alcohol use: No    Comment: occ   Drug use: No   Sexual activity: Yes    Birth control/protection: None, Surgical  Other Topics Concern   Not on file  Social History Narrative   Not on file    Social Determinants of Health   Financial Resource Strain: Not on file  Food Insecurity: Not on file  Transportation Needs: Not on file  Physical Activity: Not on file  Stress: Not on file  Social Connections: Not on file  Intimate Partner Violence: Not on file    ROS Review of Systems  Constitutional:  Negative for chills and fever.  Eyes:  Negative for visual disturbance.  Respiratory:  Negative for chest tightness and shortness of breath.   Musculoskeletal:        Right elbow soreness  Neurological:  Negative for dizziness and headaches.    Objective:   Today's Vitals: BP 132/82 (BP Location: Left Arm)   Pulse 83   Ht '4\' 10"'$  (1.473 m)   Wt 126 lb 1.3 oz (57.2 kg)   LMP 08/01/2022   SpO2 98%   BMI 26.35 kg/m   Physical Exam HENT:     Head: Normocephalic.     Mouth/Throat:     Mouth: Mucous membranes are moist.  Cardiovascular:     Rate and Rhythm: Normal rate.     Heart sounds: Normal heart sounds.  Pulmonary:     Effort: Pulmonary effort is normal.     Breath  sounds: Normal breath sounds.  Musculoskeletal:     Right elbow: No swelling, deformity, effusion or lacerations. Normal range of motion.     Left elbow: No swelling, deformity, effusion or lacerations. Normal range of motion.  Neurological:     Mental Status: She is alert.   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 at the lateral epicondyle Symptom is worse with forearm activity No recent injury or trauma reported She rates soreness 4 out of 10 No numbness or tingling her forearm Encourage conservative management Education as follow Lateral epicondylitis -Activity modification (reduction or abstinence from inciting activities) -Counterforce bracing / elbow brace (compression sleeve) -Oral analgesics: acetaminophen, nonsteroidal antiinflammatory drugs  (NSAIDs)  -Ice applied to affected epicondyle area after provocative activity   Vitamin D deficiency -     VITAMIN D 25 Hydroxy (Vit-D Deficiency, Fractures)  Other specified disorders of thyroid -     TSH + free T4  Other hyperlipidemia -     CBC with Differential/Platelet -     CMP14+EGFR -     Lipid panel  Impaired fasting blood sugar -     Hemoglobin A1c -     Microalbumin / creatinine urine ratio  Encounter for hepatitis C screening test for low risk patient -     Hepatitis C antibody     Follow-up: Return in about 1 week (around 08/12/2022) for CPE, pap smear.   Alvira Monday, FNP

## 2022-08-06 DIAGNOSIS — Z1159 Encounter for screening for other viral diseases: Secondary | ICD-10-CM | POA: Diagnosis not present

## 2022-08-06 DIAGNOSIS — E7849 Other hyperlipidemia: Secondary | ICD-10-CM | POA: Diagnosis not present

## 2022-08-06 DIAGNOSIS — R7301 Impaired fasting glucose: Secondary | ICD-10-CM | POA: Diagnosis not present

## 2022-08-06 DIAGNOSIS — E0789 Other specified disorders of thyroid: Secondary | ICD-10-CM | POA: Diagnosis not present

## 2022-08-06 DIAGNOSIS — E559 Vitamin D deficiency, unspecified: Secondary | ICD-10-CM | POA: Diagnosis not present

## 2022-08-07 LAB — CMP14+EGFR
ALT: 18 IU/L (ref 0–32)
AST: 18 IU/L (ref 0–40)
Albumin/Globulin Ratio: 1.6 (ref 1.2–2.2)
Albumin: 4.1 g/dL (ref 3.9–4.9)
BUN: 8 mg/dL (ref 6–24)
CO2: 21 mmol/L (ref 20–29)
Calcium: 9.2 mg/dL (ref 8.7–10.2)
Chloride: 106 mmol/L (ref 96–106)
Globulin, Total: 2.5 g/dL (ref 1.5–4.5)
Glucose: 96 mg/dL (ref 70–99)
Sodium: 142 mmol/L (ref 134–144)
Total Protein: 6.6 g/dL (ref 6.0–8.5)
eGFR: 112 mL/min/{1.73_m2} (ref >59)

## 2022-08-07 LAB — CBC WITH DIFFERENTIAL/PLATELET
Basophils Absolute: 0 10*3/uL (ref 0.0–0.2)
Basos: 1 %
Immature Granulocytes: 0 %
Lymphocytes Absolute: 2.1 10*3/uL (ref 0.7–3.1)
Lymphs: 32 %
MCH: 27.3 pg (ref 26.6–33.0)
Monocytes: 7 %
Neutrophils Absolute: 3.7 10*3/uL (ref 1.4–7.0)
Neutrophils: 57 %
Platelets: 343 10*3/uL (ref 150–450)
RBC: 4.76 x10E6/uL (ref 3.77–5.28)

## 2022-08-07 LAB — LIPID PANEL
Chol/HDL Ratio: 5.9 ratio — ABNORMAL HIGH (ref 0.0–4.4)
Cholesterol, Total: 196 mg/dL (ref 100–199)
HDL: 33 mg/dL — ABNORMAL LOW (ref >39)
Triglycerides: 119 mg/dL (ref 0–149)
VLDL Cholesterol Cal: 22 mg/dL (ref 5–40)

## 2022-08-07 LAB — MICROALBUMIN / CREATININE URINE RATIO

## 2022-08-07 LAB — HEPATITIS C ANTIBODY: Hep C Virus Ab: NONREACTIVE

## 2022-08-07 LAB — TSH+FREE T4: TSH: 0.704 u[IU]/mL (ref 0.450–4.500)

## 2022-08-07 LAB — HEMOGLOBIN A1C: Est. average glucose Bld gHb Est-mCnc: 120 mg/dL

## 2022-08-08 LAB — LIPID PANEL: LDL Chol Calc (NIH): 141 mg/dL — ABNORMAL HIGH (ref 0–99)

## 2022-08-08 LAB — CBC WITH DIFFERENTIAL/PLATELET
EOS (ABSOLUTE): 0.2 10*3/uL (ref 0.0–0.4)
Eos: 3 %
Hematocrit: 40 % (ref 34.0–46.6)
Hemoglobin: 13 g/dL (ref 11.1–15.9)
Immature Grans (Abs): 0 10*3/uL (ref 0.0–0.1)
MCHC: 32.5 g/dL (ref 31.5–35.7)
MCV: 84 fL (ref 79–97)
Monocytes Absolute: 0.4 10*3/uL (ref 0.1–0.9)
RDW: 12.2 % (ref 11.7–15.4)
WBC: 6.5 10*3/uL (ref 3.4–10.8)

## 2022-08-08 LAB — VITAMIN D 25 HYDROXY (VIT D DEFICIENCY, FRACTURES): Vit D, 25-Hydroxy: 25.8 ng/mL — ABNORMAL LOW (ref 30.0–100.0)

## 2022-08-08 LAB — CMP14+EGFR
Alkaline Phosphatase: 86 IU/L (ref 44–121)
BUN/Creatinine Ratio: 12 (ref 9–23)
Bilirubin Total: 0.3 mg/dL (ref 0.0–1.2)
Creatinine, Ser: 0.68 mg/dL (ref 0.57–1.00)
Potassium: 4.4 mmol/L (ref 3.5–5.2)

## 2022-08-08 LAB — HEMOGLOBIN A1C: Hgb A1c MFr Bld: 5.8 % — ABNORMAL HIGH (ref 4.8–5.6)

## 2022-08-08 LAB — MICROALBUMIN / CREATININE URINE RATIO: Microalb/Creat Ratio: 3 mg/g creat (ref 0–29)

## 2022-08-08 LAB — TSH+FREE T4: Free T4: 0.98 ng/dL (ref 0.82–1.77)

## 2022-08-23 ENCOUNTER — Encounter: Payer: Self-pay | Admitting: Family Medicine

## 2022-08-23 ENCOUNTER — Ambulatory Visit (INDEPENDENT_AMBULATORY_CARE_PROVIDER_SITE_OTHER): Payer: Medicaid Other | Admitting: Family Medicine

## 2022-08-23 ENCOUNTER — Other Ambulatory Visit (HOSPITAL_COMMUNITY)
Admission: RE | Admit: 2022-08-23 | Discharge: 2022-08-23 | Disposition: A | Discharge status: Home / Self Care | Payer: Medicaid Other | Source: Ambulatory Visit | Attending: Family Medicine | Admitting: Family Medicine

## 2022-08-23 VITALS — BP 117/78 | HR 77 | Ht <= 58 in | Wt 124.0 lb

## 2022-08-23 DIAGNOSIS — Z0001 Encounter for general adult medical examination with abnormal findings: Secondary | ICD-10-CM

## 2022-08-23 DIAGNOSIS — N6312 Unspecified lump in the right breast, upper inner quadrant: Secondary | ICD-10-CM | POA: Diagnosis not present

## 2022-08-23 DIAGNOSIS — K047 Periapical abscess without sinus: Secondary | ICD-10-CM | POA: Diagnosis not present

## 2022-08-23 MED ORDER — AMOXICILLIN-POT CLAVULANATE 875-125 MG PO TABS: 1.0000 | ORAL_TABLET | Freq: Two times a day (BID) | ORAL | 0 refills | Status: AC

## 2022-08-23 NOTE — Assessment & Plan Note (Addendum)
The patient has not yet established care with a dentist that is within her network She c/o dental tenderness and pain today Onset of symptoms for 3 days No fever or chills were reported We will treat with Augmentin for 7 days

## 2022-08-23 NOTE — Patient Instructions (Addendum)
I appreciate the opportunity to provide care to you today!    Follow up:  3 months   Please pick up your antibiotic at the pharmacy Will let you know of your Pap smear results on MyChart   Please continue to a heart-healthy diet and increase your physical activities. Try to exercise for 52mins at least five days a week.      It was a pleasure to see you and I look forward to continuing to work together on your health and well-being. Please do not hesitate to call the office if you need care or have questions about your care.   Have a wonderful day and week. With Gratitude, Alvira Monday MSN, FNP-BC

## 2022-08-23 NOTE — Assessment & Plan Note (Addendum)
Denies signs of breast cancer Palpable mass noted in the right breast at the 3o'clock position Last mammogram was 04/13/16 Will get imaging studies today

## 2022-08-23 NOTE — Assessment & Plan Note (Signed)

## 2022-08-23 NOTE — Progress Notes (Signed)
Complete physical exam  Patient: [name]   DOB: 06-18-1980   42 y.o. Female  MRN: [id number]  Subjective:    Chief Complaint  Patient presents with   Annual Exam    [name] is a 42 y.o. female who presents today for a complete physical exam. She reports consuming a general diet.  She reports working out at least 2 days a week  She generally feels well. She reports sleeping well. She does not have additional problems to discuss today.    Most recent fall risk assessment:    08/23/2022    3:40 PM  [place] in the past year? 0  Number falls in past yr: 0  Injury with Fall? 0  Risk for fall due to : No Fall Risks  Follow up Falls evaluation completed     Most recent depression screenings:    08/23/2022    3:40 PM 08/05/2022    8:22 AM  PHQ 2/9 Scores  PHQ - 2 Score 0 0  PHQ- 9 Score 0 0    Dental: No current dental problems and No regular dental care   Patient Active Problem List   Diagnosis Date Noted   Dental abscess 08/23/2022   Right lateral epicondylitis 08/05/2022   Status post primary low transverse cesarean section 01/31/2018   Encounter for annual general medical examination with abnormal findings in adult 06/14/2017   History of gestational diabetes 04/08/2016   Mass of upper inner quadrant of right breast 04/08/2016   H/O PRES (posterior reversible encephalopathy syndrome) 2009 06/18/2013   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   Past Medical History:  Diagnosis Date   Abnormal Pap smear    BV (bacterial vaginosis) 08/28/2012   Diabetes mellitus without complication ([place])    Gestational diabetes    glyburide   H/O abnormal Pap smear    HSV-2 infection    Seizures ([place])    Had a bleed in her brain, after childbirth in 2009   Unplanned wanted pregnancy 11/21/2012   Vaginal Pap smear, abnormal    Past Surgical History:  Procedure Laterality Date   CESAREAN SECTION     CESAREAN SECTION WITH BILATERAL TUBAL LIGATION N/A 01/31/2018    Procedure: CESAREAN SECTION WITH BILATERAL TUBAL LIGATION;  Surgeon: Chancy Milroy, MD;  Location: [facility];  Service: Obstetrics;  Laterality: N/A;   NO PAST SURGERIES     Social History   Tobacco Use   Smoking status: Never   Smokeless tobacco: Never  Vaping Use   Vaping Use: Never used  Substance Use Topics   Alcohol use: No    Comment: occ   Drug use: No   Social History   Socioeconomic History   Marital status: Widowed    Spouse name: Not on file   Number of children: Not on file   Years of education: Not on file   Highest education level: Not on file  Occupational History   Not on file  Tobacco Use   Smoking status: Never   Smokeless tobacco: Never  Vaping Use   Vaping Use: Never used  Substance and Sexual Activity   Alcohol use: No    Comment: occ   Drug use: No   Sexual activity: Yes    Birth control/protection: None, Surgical  Other Topics Concern   Not on file  Social History Narrative   Not on file   Social Determinants of Health   Financial Resource Strain: Not on file  Food Insecurity: Not on file  Transportation Needs: Not on file  Physical Activity: Not on file  Stress: Not on file  Social Connections: Not on file  Intimate Partner Violence: Not on file   Family Status  Relation Name Status   MGM  Deceased   Father  Alive   Mother  Alive   Brother  Alive   Sister  Start   Daughter  Alive   Son  Alive   MGF  Alive   [place]  Deceased   PGF  Deceased   Daughter  Alive   Daughter  Alive   Son  Alive   Son  Alive   Daughter  Alive   Family History  Problem Relation Age of Onset   COPD Maternal Grandmother    COPD Mother    No Known Allergies    Patient Care Team: Alvira Monday, Nadine as PCP - General (Family Medicine)   No outpatient medications prior to visit.   No facility-administered medications prior to visit.    Review of Systems  Constitutional:  Negative for chills, fever and  malaise/fatigue.  HENT:  Negative for congestion and sinus pain.        Dental pain  Eyes:  Negative for pain, discharge and redness.  Respiratory:  Negative for cough, sputum production and shortness of breath.   Cardiovascular:  Negative for chest pain, palpitations, claudication and leg swelling.  Gastrointestinal:  Negative for diarrhea, heartburn and nausea.  Genitourinary:  Negative for flank pain and frequency.  Musculoskeletal:  Negative for back pain and joint pain.  Skin:  Negative for itching.  Neurological:  Negative for dizziness, seizures and headaches.  Endo/Heme/Allergies:  Negative for environmental allergies.  Psychiatric/Behavioral:  Negative for memory loss. The patient does not have insomnia.        Objective:    BP 117/78   Pulse 77   Ht 4\' 10"  (1.473 m)   Wt 124 lb (56.2 kg)   LMP 08/01/2022   SpO2 98%   BMI 25.92 kg/m  BP Readings from Last 3 Encounters:  08/23/22 117/78  08/05/22 132/82  06/12/21 (!) 133/91   Wt Readings from Last 3 Encounters:  08/23/22 124 lb (56.2 kg)  08/05/22 126 lb 1.3 oz (57.2 kg)  03/11/20 121 lb (54.9 kg)      Physical Exam HENT:     Head: Normocephalic.     Left Ear: External ear normal.     Mouth/Throat:     Mouth: Mucous membranes are moist.     Dentition: Dental tenderness present.  Eyes:     Extraocular Movements: Extraocular movements intact.     Pupils: Pupils are equal, round, and reactive to light.  Cardiovascular:     Rate and Rhythm: Normal rate and regular rhythm.     Heart sounds: No murmur heard. Pulmonary:     Effort: Pulmonary effort is normal.     Breath sounds: Normal breath sounds.  Chest:  Breasts:    Right: Mass present. No swelling, nipple discharge, skin change or tenderness.  Abdominal:     Palpations: Abdomen is soft.     Tenderness: There is no right CVA tenderness or left CVA tenderness.  Genitourinary:    Exam position: Lithotomy position.     Tanner stage (genital): 5.      Labia:        Right: No tenderness.        Left: No tenderness.  Comments: Vaginal wall: pink and rugated, smooth and non-tender; absence of lesions, edema, and erythema. Labia Majora and Minora: present bilaterally, moist, soft tissue, and homogeneous; free of edema and ulcerations. Clitoris is anatomically present, above the urethral, and free of lesions, masses, and ulceration.   Musculoskeletal:     Right lower leg: No edema.     Left lower leg: No edema.  Skin:    Findings: No lesion or rash.  Neurological:     Mental Status: She is alert and oriented to person, place, and time.     GCS: GCS eye subscore is 4. GCS verbal subscore is 5. GCS motor subscore is 6.     Cranial Nerves: No facial asymmetry.     Sensory: No sensory deficit.     Motor: No weakness.     Coordination: Coordination normal. Finger-Nose-Finger Test normal.     Gait: Gait normal.  Psychiatric:        Judgment: Judgment normal.     No results found for any visits on 08/23/22. Last CBC Lab Results  Component Value Date   WBC 6.5 08/06/2022   HGB 13.0 08/06/2022   HCT 40.0 08/06/2022   MCV 84 08/06/2022   MCH 27.3 08/06/2022   RDW 12.2 08/06/2022   PLT 343 [id number]   Last metabolic panel Lab Results  Component Value Date   GLUCOSE 96 08/06/2022   NA 142 08/06/2022   K 4.4 08/06/2022   CL 106 08/06/2022   CO2 21 08/06/2022   BUN 8 08/06/2022   CREATININE 0.68 08/06/2022   EGFR 112 08/06/2022   CALCIUM 9.2 08/06/2022   PHOS 3.5 08/30/2007   PROT 6.6 08/06/2022   ALBUMIN 4.1 08/06/2022   LABGLOB 2.5 08/06/2022   AGRATIO 1.6 08/06/2022   BILITOT 0.3 08/06/2022   ALKPHOS 86 08/06/2022   AST 18 08/06/2022   ALT 18 08/06/2022   ANIONGAP 11 02/11/2018   Last lipids Lab Results  Component Value Date   CHOL 196 08/06/2022   HDL 33 (L) 08/06/2022   LDLCALC 141 (H) 08/06/2022   TRIG 119 08/06/2022   CHOLHDL 5.9 (H) 08/06/2022   Last hemoglobin A1c Lab Results  Component Value Date    HGBA1C 5.8 (H) 08/06/2022   Last thyroid functions Lab Results  Component Value Date   TSH 0.704 08/06/2022   Last vitamin D Lab Results  Component Value Date   VD25OH 25.8 (L) 08/06/2022   Last vitamin B12 and Folate No results found for: "VITAMINB12", "FOLATE"      Assessment & Plan:    Routine Health Maintenance and Physical Exam  Immunization History  Administered Date(s) Administered   Influenza-Unspecified 07/07/2022   Tdap 12/28/2017    Health Maintenance  Topic Date Due   COVID-19 Vaccine (1) Never done   FOOT EXAM  Never done   OPHTHALMOLOGY EXAM  Never done   PAP SMEAR-Modifier  06/24/2018   HEMOGLOBIN A1C  02/06/2023   Diabetic kidney evaluation - eGFR measurement  08/06/2023   Diabetic kidney evaluation - Urine ACR  08/06/2023   DTaP/Tdap/Td (2 - Td or Tdap) 12/29/2027   INFLUENZA VACCINE  Completed   Hepatitis C Screening  Completed   HIV Screening  Completed   HPV VACCINES  Aged Out    Discussed health benefits of physical activity, and encouraged her to engage in regular exercise appropriate for her age and condition.  Encounter for annual general medical examination with abnormal findings in adult Assessment & Plan: Physical exam as  documented Discussed heart-healthy diet  Encouraged to Exercise: If you are able: 30 -60 minutes a day ,4 days a week, or 150 minutes a week. The longer the better. Combine stretch, strength, and aerobic activities Encourage to eat whole Food, Plant Predominant Nutrition is highly recommended: Eat Plenty of vegetables, Mushrooms, fruits, Legumes, Whole Grains, Nuts, seeds in lieu of processed meats, processed snacks/pastries red meat, poultry, eggs.  Will f/u in 1 year for CPE     Orders: -     Cytology - PAP  Dental abscess Assessment & Plan: The patient has not yet established care with a dentist that is within her network She c/o dental tenderness and pain today Onset of symptoms for 3 days No fever or  chills were reported We will treat with Augmentin for 7 days  Orders: -     Amoxicillin-Pot Clavulanate; Take 1 tablet by mouth 2 (two) times daily for 7 days.  Dispense: 14 tablet; Refill: 0  Mass of upper inner quadrant of right breast Assessment & Plan: Denies signs of breast cancer Palpable mass noted in the right breast at the 3o'clock position Last mammogram was 04/13/16 Will get imaging studies today   Orders: -     MM 3D DIAGNOSTIC MAMMOGRAM BILATERAL BREAST -     [place] LIMITED ULTRASOUND INCLUDING AXILLA LEFT BREAST ; Future -     [place] LIMITED ULTRASOUND INCLUDING AXILLA RIGHT BREAST; Future    Return in about 1 year (around 08/23/2023) for CPE.     Alvira Monday, FNP

## 2022-08-24 ENCOUNTER — Encounter: Payer: Self-pay | Admitting: Family Medicine

## 2022-08-25 LAB — CYTOLOGY - PAP: Diagnosis: NEGATIVE

## 2022-08-25 NOTE — Progress Notes (Signed)
Please inform the patient that her pap smear was negative for abnormal growth or malignancy on her cervix.

## 2022-08-26 ENCOUNTER — Encounter: Payer: Self-pay | Admitting: Family Medicine

## 2022-08-27 ENCOUNTER — Telehealth: Payer: Self-pay | Admitting: Family Medicine

## 2022-08-27 NOTE — Telephone Encounter (Signed)
Patient dropped off document  vaccine form , to be filled out by provider. Patient requested to send it via Call Patient to pick up within ASAP. Document is located in providers tray at front office.Please advise at [place] [phone]

## 2022-08-31 NOTE — Telephone Encounter (Signed)
Patient picked up forms.

## 2022-09-16 ENCOUNTER — Inpatient Hospital Stay (HOSPITAL_COMMUNITY): Admission: RE | Admit: 2022-09-16 | Payer: Medicaid Other | Source: Ambulatory Visit

## 2022-09-16 ENCOUNTER — Ambulatory Visit (HOSPITAL_COMMUNITY): Payer: Medicaid Other | Attending: Family Medicine

## 2022-09-16 ENCOUNTER — Ambulatory Visit (HOSPITAL_COMMUNITY): Payer: Medicaid Other

## 2022-09-21 ENCOUNTER — Ambulatory Visit (HOSPITAL_COMMUNITY): Payer: Medicaid Other

## 2022-09-21 ENCOUNTER — Encounter (HOSPITAL_COMMUNITY): Payer: Medicaid Other

## 2022-11-26 ENCOUNTER — Ambulatory Visit: Payer: Medicaid Other | Admitting: Family Medicine
